# Patient Record
Sex: Female | Born: 1950
Health system: Southern US, Community
[De-identification: ages and names within clinical notes are randomized; demographics above are authoritative.]

## PROBLEM LIST (undated history)

## (undated) DIAGNOSIS — I313 Pericardial effusion (noninflammatory): Secondary | ICD-10-CM

## (undated) DIAGNOSIS — Z87891 Personal history of nicotine dependence: Secondary | ICD-10-CM

## (undated) DIAGNOSIS — R269 Unspecified abnormalities of gait and mobility: Secondary | ICD-10-CM

## (undated) DIAGNOSIS — I3139 Other pericardial effusion (noninflammatory): Secondary | ICD-10-CM

## (undated) DIAGNOSIS — I319 Disease of pericardium, unspecified: Secondary | ICD-10-CM

## (undated) DIAGNOSIS — IMO0002 Reserved for concepts with insufficient information to code with codable children: Secondary | ICD-10-CM

## (undated) DIAGNOSIS — Z794 Long term (current) use of insulin: Secondary | ICD-10-CM

## (undated) DIAGNOSIS — E785 Hyperlipidemia, unspecified: Secondary | ICD-10-CM

## (undated) DIAGNOSIS — J189 Pneumonia, unspecified organism: Secondary | ICD-10-CM

## (undated) DIAGNOSIS — G4733 Obstructive sleep apnea (adult) (pediatric): Secondary | ICD-10-CM

## (undated) DIAGNOSIS — R251 Tremor, unspecified: Secondary | ICD-10-CM

## (undated) DIAGNOSIS — J449 Chronic obstructive pulmonary disease, unspecified: Secondary | ICD-10-CM

## (undated) DIAGNOSIS — E119 Type 2 diabetes mellitus without complications: Secondary | ICD-10-CM

## (undated) DIAGNOSIS — IMO0001 Reserved for inherently not codable concepts without codable children: Secondary | ICD-10-CM

## (undated) DIAGNOSIS — R9430 Abnormal result of cardiovascular function study, unspecified: Secondary | ICD-10-CM

## (undated) DIAGNOSIS — K219 Gastro-esophageal reflux disease without esophagitis: Secondary | ICD-10-CM

## (undated) HISTORY — DX: Other pericardial effusion (noninflammatory): I31.39

## (undated) HISTORY — DX: Chronic obstructive pulmonary disease, unspecified: J44.9

## (undated) HISTORY — PX: OTHER SURGICAL HISTORY: SHX169

## (undated) HISTORY — DX: Hyperlipidemia, unspecified: E78.5

## (undated) HISTORY — DX: Disease of pericardium, unspecified: I31.9

## (undated) HISTORY — DX: Tremor, unspecified: R25.1

## (undated) HISTORY — DX: Pericardial effusion (noninflammatory): I31.3

## (undated) HISTORY — DX: Reserved for inherently not codable concepts without codable children: IMO0001

## (undated) HISTORY — DX: Personal history of nicotine dependence: Z87.891

## (undated) HISTORY — DX: Abnormal result of cardiovascular function study, unspecified: R94.30

## (undated) HISTORY — DX: Unspecified abnormalities of gait and mobility: R26.9

## (undated) HISTORY — DX: Gastro-esophageal reflux disease without esophagitis: K21.9

## (undated) HISTORY — DX: Obstructive sleep apnea (adult) (pediatric): G47.33

## (undated) HISTORY — DX: Long term (current) use of insulin: Z79.4

## (undated) HISTORY — DX: Morbid (severe) obesity due to excess calories: E66.01

## (undated) HISTORY — DX: Pneumonia, unspecified organism: J18.9

## (undated) HISTORY — DX: Reserved for concepts with insufficient information to code with codable children: IMO0002

## (undated) HISTORY — PX: ABDOMINAL HYSTERECTOMY: SHX81

## (undated) HISTORY — DX: Type 2 diabetes mellitus without complications: E11.9

## (undated) HISTORY — PX: NASAL SINUS SURGERY: SHX719

---

## 2008-06-18 DIAGNOSIS — J189 Pneumonia, unspecified organism: Secondary | ICD-10-CM

## 2008-06-18 HISTORY — DX: Pneumonia, unspecified organism: J18.9

## 2010-11-02 DIAGNOSIS — R0602 Shortness of breath: Secondary | ICD-10-CM

## 2010-11-15 DIAGNOSIS — R072 Precordial pain: Secondary | ICD-10-CM

## 2010-11-16 DIAGNOSIS — I319 Disease of pericardium, unspecified: Secondary | ICD-10-CM

## 2010-11-17 DIAGNOSIS — I300 Acute nonspecific idiopathic pericarditis: Secondary | ICD-10-CM

## 2010-11-20 DIAGNOSIS — I309 Acute pericarditis, unspecified: Secondary | ICD-10-CM

## 2010-11-21 ENCOUNTER — Encounter: Payer: Self-pay | Admitting: Physician Assistant

## 2010-11-27 ENCOUNTER — Telehealth: Payer: Self-pay | Admitting: *Deleted

## 2010-11-27 NOTE — Telephone Encounter (Signed)
Pt notified of results and verbalized understanding. Pt offered appt on Jun 27th but states she will be out of town that day. She is scheduled for 12/26/10.

## 2010-11-27 NOTE — Telephone Encounter (Signed)
Left message to call back on voicemail. (Pt will need to be set up with Dr. Myrtis Ser in the office.)

## 2010-11-27 NOTE — Telephone Encounter (Signed)
Message copied by Arlyss Gandy on Mon Nov 27, 2010  4:30 PM ------      Message from: Rande Brunt      Created: Fri Nov 24, 2010  8:20 AM       Reviewed hospital records. Recommend post hospital f/u with Myrtis Ser. If not available, then [place]

## 2010-12-13 ENCOUNTER — Encounter: Payer: Self-pay | Admitting: *Deleted

## 2010-12-25 ENCOUNTER — Encounter: Payer: Self-pay | Admitting: Cardiology

## 2010-12-25 DIAGNOSIS — I313 Pericardial effusion (noninflammatory): Secondary | ICD-10-CM | POA: Insufficient documentation

## 2010-12-25 DIAGNOSIS — J449 Chronic obstructive pulmonary disease, unspecified: Secondary | ICD-10-CM | POA: Insufficient documentation

## 2010-12-25 DIAGNOSIS — I319 Disease of pericardium, unspecified: Secondary | ICD-10-CM | POA: Insufficient documentation

## 2010-12-25 DIAGNOSIS — J45909 Unspecified asthma, uncomplicated: Secondary | ICD-10-CM | POA: Insufficient documentation

## 2010-12-25 DIAGNOSIS — R079 Chest pain, unspecified: Secondary | ICD-10-CM | POA: Insufficient documentation

## 2010-12-25 DIAGNOSIS — J4489 Other specified chronic obstructive pulmonary disease: Secondary | ICD-10-CM | POA: Insufficient documentation

## 2010-12-25 DIAGNOSIS — E1165 Type 2 diabetes mellitus with hyperglycemia: Secondary | ICD-10-CM | POA: Insufficient documentation

## 2010-12-25 DIAGNOSIS — K219 Gastro-esophageal reflux disease without esophagitis: Secondary | ICD-10-CM | POA: Insufficient documentation

## 2010-12-25 DIAGNOSIS — E785 Hyperlipidemia, unspecified: Secondary | ICD-10-CM | POA: Insufficient documentation

## 2010-12-25 DIAGNOSIS — R9430 Abnormal result of cardiovascular function study, unspecified: Secondary | ICD-10-CM | POA: Insufficient documentation

## 2010-12-25 DIAGNOSIS — Z87891 Personal history of nicotine dependence: Secondary | ICD-10-CM | POA: Insufficient documentation

## 2010-12-26 ENCOUNTER — Ambulatory Visit (INDEPENDENT_AMBULATORY_CARE_PROVIDER_SITE_OTHER): Payer: Medicare Other | Admitting: Cardiology

## 2010-12-26 ENCOUNTER — Encounter: Payer: Self-pay | Admitting: Cardiology

## 2010-12-26 DIAGNOSIS — I319 Disease of pericardium, unspecified: Secondary | ICD-10-CM

## 2010-12-26 DIAGNOSIS — K219 Gastro-esophageal reflux disease without esophagitis: Secondary | ICD-10-CM

## 2010-12-26 DIAGNOSIS — J449 Chronic obstructive pulmonary disease, unspecified: Secondary | ICD-10-CM

## 2010-12-26 NOTE — Assessment & Plan Note (Signed)
Patient has significant lung disease and is seeing pulmonology in her hometown.

## 2010-12-26 NOTE — Assessment & Plan Note (Signed)
Patient has recovered very nicely from her episode of pericarditis.  She does not need a followup echo.  I am hopeful that she will have no recurrence in the future.  Since she is still on steroids it is yet to be seen if she will have any recurrence off medications.  I am hopeful that she will not.

## 2010-12-26 NOTE — Assessment & Plan Note (Signed)
Her GERD symptoms are stable.  No further workup.

## 2010-12-26 NOTE — Progress Notes (Signed)
HPI Patient was seen for post hospital followup of pericarditis.  She had an episode and was hospitalized in late May, 2012.  She had a small pericardial effusion.  She has normal left ventricular function.  She has had a followup echo since her discharge.  This study shows normal left ventricular function.  There is the possibility of a slight persistent pericardial effusion.  There was no hemodynamic significance.  She has significant lung disease and had been on steroids in the past.  She is currently on tapering doses of steroids.  Hopefully when this is tapered completely she will not have recurrence of her pericarditis.  As part of my evaluation today I reviewed the hospital records including the discharge summary on our consultation.  I also reviewed the followup echo report carefully.  I discussed this with her. Allergies  Allergen Reactions  . Penicillins Itching  . Sulfa Antibiotics Itching    Current Outpatient Prescriptions  Medication Sig Dispense Refill  . aspirin 81 MG tablet Take 81 mg by mouth daily.        . Calcium Carbonate-Vit D-Min (CALCIUM 1200 PO) Take 1 tablet by mouth daily.        . Cholecalciferol (VITAMIN D3) 1000 UNITS CAPS Take 1 capsule by mouth daily.        . clonazePAM (KLONOPIN) 1 MG tablet Take 1 mg by mouth as needed.        . fish oil-omega-3 fatty acids 1000 MG capsule Take 2 g by mouth daily.        . fluticasone-salmeterol (ADVAIR HFA) 230-21 MCG/ACT inhaler Inhale 2 puffs into the lungs 2 (two) times daily.        . insulin aspart (NOVOLOG) 100 UNIT/ML injection Inject 6 Units into the skin 3 (three) times daily before meals.        . insulin glargine (LANTUS) 100 UNIT/ML injection Inject 35 Units into the skin at bedtime.       . metFORMIN (GLUCOPHAGE) 1000 MG tablet Take 1 tablet by mouth Twice daily.      . montelukast (SINGULAIR) 10 MG tablet Take 10 mg by mouth at bedtime.        . OXYGEN-HELIUM IN Inhale 2 L into the lungs at bedtime as needed.         . pantoprazole (PROTONIX) 40 MG tablet Take 1 tablet by mouth Daily.      . predniSONE (DELTASONE) 10 MG tablet Take 10 mg by mouth daily.       Marland Kitchen tiotropium (SPIRIVA) 18 MCG inhalation capsule Place 18 mcg into inhaler and inhale daily.        Marland Kitchen DISCONTD: Fluticasone-Salmeterol (ADVAIR DISKUS) 250-50 MCG/DOSE AEPB Inhale 1 puff into the lungs every 12 (twelve) hours.        Marland Kitchen DISCONTD: loratadine (CLARITIN) 10 MG tablet Take 10 mg by mouth daily.          History   Social History  . Marital Status: Divorced    Spouse Name: N/A    Number of Children: N/A  . Years of Education: N/A   Occupational History  . 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 Smoking status: Former Smoker -- 1.0 packs/day for 20 years    Types: Cigarettes    Quit date: 11/17/1995  . Smokeless tobacco: Never Used  . Alcohol Use: Yes     drinks on rare  occasions  . Drug Use: No  . Sexually Active: Not on file   Other Topics Concern  . Not on file   Social History Narrative   Lives in [place] alone.    Family History  Problem Relation Age of Onset  . Heart attack Mother     deceased at age 66  . Asthma Mother   . Other Father     deceased at age 73    Past Medical History  Diagnosis Date  . IDDM (insulin dependent diabetes mellitus)   . Dyslipidemia   . COPD (chronic obstructive pulmonary disease)     Chronic steroid use  . History of tobacco abuse   . Asthma   . Pneumonia 2010  . GERD (gastroesophageal reflux disease)   . Morbid obesity   . OSA (obstructive sleep apnea)     mild/not using C-PAP   . Chest pain     Hospital, May, 2012,  Pericarditis  . Pericardial effusion     Small, echo, circumferential, May, 2012  . Ejection fraction     Normal, echo, [id number]  . Pericarditis     Hospitalization, May, 2012    Past Surgical History  Procedure Date  . Removal of throat nodules     vocal cored nodules  .  Abdominal hysterectomy   . Nasal sinus surgery     ROS  Patient denies fever, chills, headache, sweats, rash, change in vision, change in hearing, chest pain, cough, nausea vomiting, urinary symptoms.  All the systems are reviewed and are negative.  PHYSICAL EXAM Patient is oriented to person time and place.  Affect is normal.  She is here with her young granddaughter today.  Lungs are clear.  Respiratory effort is unlabored.  Head is atraumatic there is no xanthelasma.  There is no jugular venous distention.  Lungs are clear.  Respiratory effort is not labored.  Cardiac exam reveals S1 and S2.  No clicks or significant murmurs.  The abdomen is soft.  There is no peripheral edema.  No musculoskeletal deformities.  There no skin rashes. Filed Vitals:   12/26/10 1011  BP: 117/81  Pulse: 107  Height: 5\' 4"  (1.626 m)  Weight: 186 lb (84.369 kg)  SpO2: 96%    EKG Is not done today.  ASSESSMENT & PLAN

## 2010-12-26 NOTE — Patient Instructions (Signed)
   Follow up as needed. Your physician recommends that you continue on your current medications as directed. Please refer to the Current Medication list given to you today. 

## 2015-06-23 LAB — HEMOGLOBIN A1C: HEMOGLOBIN A1C: 9.8

## 2015-08-01 ENCOUNTER — Encounter: Payer: Self-pay | Admitting: "Endocrinology

## 2015-08-01 ENCOUNTER — Ambulatory Visit (INDEPENDENT_AMBULATORY_CARE_PROVIDER_SITE_OTHER): Payer: Medicare HMO | Admitting: "Endocrinology

## 2015-08-01 VITALS — BP 127/83 | HR 95 | Ht 63.5 in | Wt 177.0 lb

## 2015-08-01 DIAGNOSIS — I10 Essential (primary) hypertension: Secondary | ICD-10-CM | POA: Diagnosis not present

## 2015-08-01 DIAGNOSIS — E1165 Type 2 diabetes mellitus with hyperglycemia: Secondary | ICD-10-CM | POA: Diagnosis not present

## 2015-08-01 DIAGNOSIS — E785 Hyperlipidemia, unspecified: Secondary | ICD-10-CM | POA: Diagnosis not present

## 2015-08-01 DIAGNOSIS — Z794 Long term (current) use of insulin: Secondary | ICD-10-CM

## 2015-08-01 DIAGNOSIS — E118 Type 2 diabetes mellitus with unspecified complications: Secondary | ICD-10-CM

## 2015-08-01 DIAGNOSIS — IMO0002 Reserved for concepts with insufficient information to code with codable children: Secondary | ICD-10-CM

## 2015-08-01 MED ORDER — INSULIN NPH ISOPHANE & REGULAR (70-30) 100 UNIT/ML ~~LOC~~ SUSP: 30.0000 [IU] | Freq: Two times a day (BID) | SUBCUTANEOUS | Status: DC

## 2015-08-01 NOTE — Patient Instructions (Signed)

## 2015-08-01 NOTE — Progress Notes (Signed)
Subjective:    Patient ID: Emily Humphrey, female    DOB: 03/01/1951. Patient is being seen in consultation for management of diabetes requested by  TAPPER,DAVID B, MD  Past Medical History  Diagnosis Date  . IDDM (insulin dependent diabetes mellitus) ([facility])   . Dyslipidemia   . COPD (chronic obstructive pulmonary disease) ([facility])     Chronic steroid use  . History of tobacco abuse   . Asthma   . Pneumonia 2010  . GERD (gastroesophageal reflux disease)   . Morbid obesity ([facility])   . OSA (obstructive sleep apnea)     mild/not using C-PAP   . Chest pain     Hospital, May, 2012,  Pericarditis  . Pericardial effusion     Small, echo, circumferential, May, 2012  . Ejection fraction     Normal, echo, [id number]  . Pericarditis     Hospitalization, May, 2012   Past Surgical History  Procedure Laterality Date  . Removal of throat nodules      vocal cored nodules  . Abdominal hysterectomy    . Nasal sinus surgery     Social History   Social History  . Marital Status: Divorced    Spouse Name: N/A  . Number of Children: N/A  . Years of Education: N/A   Occupational History  . 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 Smoking status: Former Smoker -- 1.00 packs/day for 20 years    Types: Cigarettes    Quit date: 11/17/1995  . Smokeless tobacco: Never Used  . Alcohol Use: Yes     Comment: drinks on rare occasions  . Drug Use: No  . Sexual Activity: Not Asked   Other Topics Concern  . None   Social History Narrative   Lives in [place] alone.   Outpatient Encounter Prescriptions as of 08/01/2015  Medication Sig  . Calcium Carbonate-Vit D-Min (CALCIUM 1200 PO) Take 1 tablet by mouth daily.    . clonazePAM (KLONOPIN) 1 MG tablet Take 1 mg by mouth as needed.    . fluticasone furoate-vilanterol (BREO ELLIPTA) 200-25 MCG/INH AEPB Inhale 1 puff into the lungs daily.  Marland Kitchen losartan (COZAAR) 50 MG tablet Take 50 mg  by mouth daily.  . metFORMIN (GLUCOPHAGE) 1000 MG tablet Take 1 tablet by mouth Twice daily.  . pantoprazole (PROTONIX) 40 MG tablet Take 1 tablet by mouth Daily.  . predniSONE (DELTASONE) 10 MG tablet Take 15 mg by mouth daily.  . sertraline (ZOLOFT) 100 MG tablet Take 100 mg by mouth daily.  Marland Kitchen umeclidinium bromide (INCRUSE ELLIPTA) 62.5 MCG/INH AEPB Inhale 1 puff into the lungs daily.  . [DISCONTINUED] insulin NPH Human (HUMULIN N,NOVOLIN N) 100 UNIT/ML injection Inject into the skin 2 (two) times daily before a meal. 35 units in the am & 15 units in the pm  . aspirin 81 MG tablet Take 81 mg by mouth daily.    . Cholecalciferol (VITAMIN D3) 1000 UNITS CAPS Take 1 capsule by mouth daily.    . fish oil-omega-3 fatty acids 1000 MG capsule Take 2 g by mouth daily.    . fluticasone-salmeterol (ADVAIR HFA) 230-21 MCG/ACT inhaler Inhale 2 puffs into the lungs 2 (two) times daily.    . insulin NPH-regular Human (NOVOLIN 70/30) (70-30) 100 UNIT/ML injection Inject 30 Units into the skin 2 (two) times daily with a meal.  . montelukast (SINGULAIR) 10 MG tablet Take  10 mg by mouth at bedtime.    . OXYGEN-HELIUM IN Inhale 2 L into the lungs at bedtime as needed.    . tiotropium (SPIRIVA) 18 MCG inhalation capsule Place 18 mcg into inhaler and inhale daily.    . [DISCONTINUED] insulin aspart (NOVOLOG) 100 UNIT/ML injection Inject 6 Units into the skin 3 (three) times daily before meals.    . [DISCONTINUED] insulin glargine (LANTUS) 100 UNIT/ML injection Inject 35 Units into the skin at bedtime.    No facility-administered encounter medications on file as of 08/01/2015.   ALLERGIES: Allergies  Allergen Reactions  . Penicillins Itching  . Sulfa Antibiotics Itching   VACCINATION STATUS:  There is no immunization history on file for this patient.  Diabetes She presents for her initial diabetic visit. She has type 2 diabetes mellitus. Onset time: She was diagnosed at approximate age of 65 years. Her  disease course has been worsening. There are no hypoglycemic associated symptoms. Pertinent negatives for hypoglycemia include no confusion, headaches, pallor or seizures. Associated symptoms include fatigue, polydipsia and polyuria. Pertinent negatives for diabetes include no chest pain and no polyphagia. There are no hypoglycemic complications. Symptoms are worsening. There are no diabetic complications. Risk factors for coronary artery disease include diabetes mellitus, obesity, hypertension, sedentary lifestyle and tobacco exposure. Current diabetic treatment includes insulin injections (She uses Novolin and 35 units a.m. and 15 units PM. She also uses metformin 1000 g by mouth twice a day.). Her weight is increasing steadily (She has taken prednisone for approximately 20 years as a result of her COPD, currently 15 mg daily). She is following a generally unhealthy diet. When asked about meal planning, she reported none. She has not had a previous visit with a dietitian. She rarely participates in exercise. Home blood sugar record trend: She did not bring the meter nor logs to review and admits to not monitoring regularly. An ACE inhibitor/angiotensin II receptor blocker is not being taken. Eye exam is current.  Hypertension This is a chronic problem. The current episode started more than 1 year ago. Pertinent negatives include no chest pain, headaches, palpitations or shortness of breath. Risk factors for coronary artery disease include diabetes mellitus, obesity, smoking/tobacco exposure and sedentary lifestyle. Past treatments include nothing.      Review of Systems  Constitutional: Positive for fatigue. Negative for fever, chills and unexpected weight change.  HENT: Negative for trouble swallowing and voice change.   Eyes: Negative for visual disturbance.  Respiratory: Negative for cough, shortness of breath and wheezing.   Cardiovascular: Negative for chest pain, palpitations and leg swelling.   Gastrointestinal: Negative for nausea, vomiting and diarrhea.  Endocrine: Positive for polydipsia and polyuria. Negative for cold intolerance, heat intolerance and polyphagia.  Musculoskeletal: Negative for myalgias and arthralgias.  Skin: Negative for color change, pallor, rash and wound.  Neurological: Negative for seizures and headaches.  Psychiatric/Behavioral: Negative for suicidal ideas and confusion.    Objective:    BP 127/83 mmHg  Pulse 95  Ht 5' 3.5" (1.613 m)  Wt 177 lb (80.287 kg)  BMI 30.86 kg/m2  SpO2 97%  Wt Readings from Last 3 Encounters:  08/01/15 177 lb (80.287 kg)  12/26/10 186 lb (84.369 kg)    Physical Exam  Constitutional: She is oriented to person, place, and time. She appears well-developed.  HENT:  Head: Normocephalic and atraumatic.  Eyes: EOM are normal.  Neck: Normal range of motion. Neck supple. No tracheal deviation present. No thyromegaly present.  Cardiovascular: Normal rate and  regular rhythm.   Pulmonary/Chest: Effort normal and breath sounds normal.  Abdominal: Soft. Bowel sounds are normal. There is no tenderness. There is no guarding.  Musculoskeletal: Normal range of motion. She exhibits no edema.  Neurological: She is alert and oriented to person, place, and time. She has normal reflexes. No cranial nerve deficit. Coordination normal.  Skin: Skin is warm and dry. No rash noted. No erythema. No pallor.  Psychiatric: She has a normal mood and affect. Judgment normal.    Her most recent A1c from 06/23/2015 was 9.8%. Her complete labs to be scanned into her records.   Assessment & Plan:   1. Uncontrolled type 2 diabetes mellitus with complication, with long-term current use of insulin ([place])  - Patient has currently uncontrolled symptomatic type 2 DM since  65 years of age,  with most recent A1c of 9.8 %. Recent labs reviewed.   Her diabetes is complicated by chronic steroid use due to COPD (currently concurrent use of prednisone 15 mg  by mouth daily) and patient remains at a high risk for more acute and chronic complications of diabetes which include CAD, CVA, CKD, retinopathy, and neuropathy. These are all discussed in detail with the patient.  - I have counseled the patient on diet management and weight loss, by adopting a carbohydrate restricted/protein rich diet.  - Suggestion is made for patient to avoid simple carbohydrates   from their diet including Cakes , Desserts, Ice Cream,  Soda (  diet and regular) , Sweet Tea , Candies,  Chips, Cookies, Artificial Sweeteners,   and "Sugar-free" Products . This will help patient to have stable blood glucose profile and potentially avoid unintended weight gain.  - I encouraged the patient to switch to  unprocessed or minimally processed complex starch and increased protein intake (animal or plant source), fruits, and vegetables.  - Patient is advised to stick to a routine mealtimes to eat 3 meals  a day and avoid unnecessary snacks ( to snack only to correct hypoglycemia).  - The patient will be scheduled with Jearld Fenton, RDN, CDE for individualized DM education.  - I have approached patient with the following individualized plan to manage diabetes and patient agrees:   - Given her high A1c, she would benefit from basal bolus insulin, however she insists that she cannot afford the co-pays for that he insulin analogs.  -Premixed insulin would still be at a better  Alternative to her current Novolin in. -I will prescribed Novolin 70/30/30 for her to use with breakfast and supper 30 units each time pre-meal blood glucose is above 90 mg/dL, associated with strict monitoring of glucose  AC and HS. -She will return in one week to review her logs and to make adjustments if necessary. - Patient is warned not to take insulin without proper monitoring per orders. -Adjustment parameters are given for hypo and hyperglycemia in writing. -Patient is encouraged to call clinic for blood  glucose levels less than 70 or above 300 mg /dl. - I will continue metformin 1000 mg by mouth twice a day, therapeutically suitable for patient. -Also note, given the fact that she took prednisone even at higher doses for the last 20 years puts her at risk of steroid induced adrenal insufficiency. I advised her not to stop steroids suddenly without tapering. -She will likely require at least replacement dose steroids equivalent of prednisone 10 mg on a daily basis.  - Patient will be considered for incretin therapy as appropriate next visit. - Patient  specific target  A1c;  LDL, HDL, Triglycerides, and  Waist Circumference were discussed in detail.  2) BP/HTN:  Controlled, currently not taking any medications. 3) Lipids/HPL:  Controlled with LDL of 88.  I will consider statins next visit.  4)  Weight/Diet: CDE Consult will be initiated , exercise, and detailed carbohydrates information provided.  5) Chronic Care/Health Maintenance:  -Patient is encouraged to continue to follow up with Ophthalmology, Podiatrist at least yearly or according to recommendations, and advised to   stay away from smoking. I have recommended yearly flu vaccine and pneumonia vaccination at least every 5 years; moderate intensity exercise for up to 150 minutes weekly; and  sleep for at least 7 hours a day.  - 60 minutes of time was spent on the care of this patient , 50% of which was applied for counseling on diabetes complications and their preventions.  - Patient to bring meter and  blood glucose logs during their next visit.   - I advised patient to maintain close follow up with TAPPER,DAVID B, MD for primary care needs.  Follow up plan: - Return in about 1 week (around 08/08/2015) for diabetes, high blood pressure, high cholesterol, follow up with meter and logs- no labs.  Glade Lloyd, MD Phone: [phone]  Fax: [phone]   08/01/2015, 4:05 PM

## 2015-08-09 ENCOUNTER — Ambulatory Visit (INDEPENDENT_AMBULATORY_CARE_PROVIDER_SITE_OTHER): Payer: Medicare HMO | Admitting: "Endocrinology

## 2015-08-09 ENCOUNTER — Encounter: Payer: Self-pay | Admitting: "Endocrinology

## 2015-08-09 VITALS — BP 106/78 | HR 93 | Ht 63.5 in | Wt 178.0 lb

## 2015-08-09 DIAGNOSIS — E1165 Type 2 diabetes mellitus with hyperglycemia: Secondary | ICD-10-CM | POA: Diagnosis not present

## 2015-08-09 DIAGNOSIS — I10 Essential (primary) hypertension: Secondary | ICD-10-CM

## 2015-08-09 DIAGNOSIS — E118 Type 2 diabetes mellitus with unspecified complications: Secondary | ICD-10-CM | POA: Diagnosis not present

## 2015-08-09 DIAGNOSIS — IMO0002 Reserved for concepts with insufficient information to code with codable children: Secondary | ICD-10-CM

## 2015-08-09 DIAGNOSIS — E785 Hyperlipidemia, unspecified: Secondary | ICD-10-CM | POA: Diagnosis not present

## 2015-08-09 DIAGNOSIS — Z794 Long term (current) use of insulin: Secondary | ICD-10-CM | POA: Diagnosis not present

## 2015-08-09 NOTE — Patient Instructions (Signed)

## 2015-08-09 NOTE — Progress Notes (Signed)
Subjective:    Patient ID: Emily Humphrey, female    DOB: Jun 17, 1951. Patient is being seen in consultation for management of diabetes requested by  TAPPER,DAVID B, MD  Past Medical History  Diagnosis Date  . IDDM (insulin dependent diabetes mellitus) ([place])   . Dyslipidemia   . COPD (chronic obstructive pulmonary disease) ([facility])     Chronic steroid use  . History of tobacco abuse   . Asthma   . Pneumonia 2010  . GERD (gastroesophageal reflux disease)   . Morbid obesity ([place])   . OSA (obstructive sleep apnea)     mild/not using C-PAP   . Chest pain     Hospital, May, 2012,  Pericarditis  . Pericardial effusion     Small, echo, circumferential, May, 2012  . Ejection fraction     Normal, echo, [id number]  . Pericarditis     Hospitalization, May, 2012   Past Surgical History  Procedure Laterality Date  . Removal of throat nodules      vocal cored nodules  . Abdominal hysterectomy    . Nasal sinus surgery     Social History   Social History  . Marital Status: Divorced    Spouse Name: N/A  . Number of Children: N/A  . Years of Education: N/A   Occupational History  . 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 Smoking status: Former Smoker -- 1.00 packs/day for 20 years    Types: Cigarettes    Quit date: 11/17/1995  . Smokeless tobacco: Never Used  . Alcohol Use: Yes     Comment: drinks on rare occasions  . Drug Use: No  . Sexual Activity: Not Asked   Other Topics Concern  . None   Social History Narrative   Lives in [place] alone.   Outpatient Encounter Prescriptions as of 08/09/2015  Medication Sig  . aspirin 81 MG tablet Take 81 mg by mouth daily.    . Calcium Carbonate-Vit D-Min (CALCIUM 1200 PO) Take 1 tablet by mouth daily.    . Cholecalciferol (VITAMIN D3) 1000 UNITS CAPS Take 1 capsule by mouth daily.    . clonazePAM (KLONOPIN) 1 MG tablet Take 1 mg by mouth as needed.    . fish  oil-omega-3 fatty acids 1000 MG capsule Take 2 g by mouth daily.    . fluticasone furoate-vilanterol (BREO ELLIPTA) 200-25 MCG/INH AEPB Inhale 1 puff into the lungs daily.  . fluticasone-salmeterol (ADVAIR HFA) 230-21 MCG/ACT inhaler Inhale 2 puffs into the lungs 2 (two) times daily.    . insulin NPH-regular Human (NOVOLIN 70/30) (70-30) 100 UNIT/ML injection Inject 30 Units into the skin 2 (two) times daily with a meal.  . losartan (COZAAR) 50 MG tablet Take 50 mg by mouth daily.  . metFORMIN (GLUCOPHAGE) 1000 MG tablet Take 1 tablet by mouth Twice daily.  . montelukast (SINGULAIR) 10 MG tablet Take 10 mg by mouth at bedtime.    . OXYGEN-HELIUM IN Inhale 2 L into the lungs at bedtime as needed.    . pantoprazole (PROTONIX) 40 MG tablet Take 1 tablet by mouth Daily.  . predniSONE (DELTASONE) 10 MG tablet Take 15 mg by mouth daily.  . sertraline (ZOLOFT) 100 MG tablet Take 100 mg by mouth daily.  Marland Kitchen tiotropium (SPIRIVA) 18 MCG inhalation capsule Place 18 mcg into inhaler and inhale daily.    Marland Kitchen umeclidinium bromide (INCRUSE ELLIPTA) 62.5 MCG/INH  AEPB Inhale 1 puff into the lungs daily.   No facility-administered encounter medications on file as of 08/09/2015.   ALLERGIES: Allergies  Allergen Reactions  . Penicillins Itching  . Sulfa Antibiotics Itching   VACCINATION STATUS:  There is no immunization history on file for this patient.  Diabetes She presents for her follow-up diabetic visit. She has type 2 diabetes mellitus. Onset time: She was diagnosed at approximate age of 82 years. Her disease course has been improving. There are no hypoglycemic associated symptoms. Pertinent negatives for hypoglycemia include no confusion, headaches, pallor or seizures. Associated symptoms include fatigue. Pertinent negatives for diabetes include no chest pain, no polydipsia, no polyphagia and no polyuria. There are no hypoglycemic complications. Symptoms are improving. There are no diabetic complications.  Risk factors for coronary artery disease include diabetes mellitus, obesity, hypertension, sedentary lifestyle and tobacco exposure. Current diabetic treatment includes insulin injections (She uses Novolin and 35 units a.m. and 15 units PM. She also uses metformin 1000 g by mouth twice a day.). Her weight is stable (She has taken prednisone for approximately 20 years as a result of her COPD, currently 10 mg daily). She is following a generally unhealthy diet. When asked about meal planning, she reported none. She has not had a previous visit with a dietitian. She rarely participates in exercise. Home blood sugar record trend: She came with improved blood glucose averaging 166. An ACE inhibitor/angiotensin II receptor blocker is not being taken. Eye exam is current.  Hypertension This is a chronic problem. The current episode started more than 1 year ago. Pertinent negatives include no chest pain, headaches, palpitations or shortness of breath. Risk factors for coronary artery disease include diabetes mellitus, obesity, smoking/tobacco exposure and sedentary lifestyle. Past treatments include nothing.      Review of Systems  Constitutional: Positive for fatigue. Negative for fever, chills and unexpected weight change.  HENT: Negative for trouble swallowing and voice change.   Eyes: Negative for visual disturbance.  Respiratory: Negative for cough, shortness of breath and wheezing.   Cardiovascular: Negative for chest pain, palpitations and leg swelling.  Gastrointestinal: Negative for nausea, vomiting and diarrhea.  Endocrine: Negative for cold intolerance, heat intolerance, polydipsia, polyphagia and polyuria.  Musculoskeletal: Negative for myalgias and arthralgias.  Skin: Negative for color change, pallor, rash and wound.  Neurological: Negative for seizures and headaches.  Psychiatric/Behavioral: Negative for suicidal ideas and confusion.    Objective:    BP 106/78 mmHg  Pulse 93  Ht 5'  3.5" (1.613 m)  Wt 178 lb (80.74 kg)  BMI 31.03 kg/m2  SpO2 100%  Wt Readings from Last 3 Encounters:  08/09/15 178 lb (80.74 kg)  08/01/15 177 lb (80.287 kg)  12/26/10 186 lb (84.369 kg)    Physical Exam  Constitutional: She is oriented to person, place, and time. She appears well-developed.  HENT:  Head: Normocephalic and atraumatic.  Eyes: EOM are normal.  Neck: Normal range of motion. Neck supple. No tracheal deviation present. No thyromegaly present.  Cardiovascular: Normal rate and regular rhythm.   Pulmonary/Chest: Effort normal and breath sounds normal.  Abdominal: Soft. Bowel sounds are normal. There is no tenderness. There is no guarding.  Musculoskeletal: Normal range of motion. She exhibits no edema.  Neurological: She is alert and oriented to person, place, and time. She has normal reflexes. No cranial nerve deficit. Coordination normal.  Skin: Skin is warm and dry. No rash noted. No erythema. No pallor.  Psychiatric: She has a normal mood and  affect. Judgment normal.    Her most recent A1c from 06/23/2015 was 9.8%. Her complete labs to be scanned into her records.   Assessment & Plan:   1. Uncontrolled type 2 diabetes mellitus with complication, with long-term current use of insulin ([place])  - Patient has currently uncontrolled symptomatic type 2 DM since  65 years of age,  with most recent A1c of 9.8 %. Recent labs reviewed.   Her diabetes is complicated by chronic steroid use due to COPD (currently concurrent use of prednisone 15 mg by mouth daily) and patient remains at a high risk for more acute and chronic complications of diabetes which include CAD, CVA, CKD, retinopathy, and neuropathy. These are all discussed in detail with the patient.  - I have counseled the patient on diet management and weight loss, by adopting a carbohydrate restricted/protein rich diet.  - Suggestion is made for patient to avoid simple carbohydrates   from their diet including Cakes ,  Desserts, Ice Cream,  Soda (  diet and regular) , Sweet Tea , Candies,  Chips, Cookies, Artificial Sweeteners,   and "Sugar-free" Products . This will help patient to have stable blood glucose profile and potentially avoid unintended weight gain.  - I encouraged the patient to switch to  unprocessed or minimally processed complex starch and increased protein intake (animal or plant source), fruits, and vegetables.  - Patient is advised to stick to a routine mealtimes to eat 3 meals  a day and avoid unnecessary snacks ( to snack only to correct hypoglycemia).  - The patient will be scheduled with Jearld Fenton, RDN, CDE for individualized DM education.  - I have approached patient with the following individualized plan to manage diabetes and patient agrees:   - Given her high A1c, she would benefit from basal bolus insulin, however she insists that she cannot afford the co-pays for the insulin analogs.  -Premixed insulin would still be at a better  Alternative to her current Novolin in. -I will  continue with Novolin 70/30 30  units with breakfast and 20 units with supper when  pre-meal blood glucose is above 90 mg/dL, associated with strict monitoring of glucose  AC and HS.  - Patient is warned not to take insulin without proper monitoring per orders. -Adjustment parameters are given for hypo and hyperglycemia in writing. -Patient is encouraged to call clinic for blood glucose levels less than 70 or above 300 mg /dl. - I will continue metformin 1000 mg by mouth twice a day, therapeutically suitable for patient. -Also note, given the fact that she took prednisone even at higher doses for the last 20 years puts her at risk of steroid induced adrenal insufficiency. I advised her not to stop steroids suddenly without tapering. -She will likely require at least replacement dose steroids equivalent of prednisone 10 mg on a daily basis.  - Patient will be considered for incretin therapy as appropriate  next visit. - Patient specific target  A1c;  LDL, HDL, Triglycerides, and  Waist Circumference were discussed in detail.  2) BP/HTN:  Controlled, currently not taking any medications. 3) Lipids/HPL:  Controlled with LDL of 88.  I will consider statins next visit.  4)  Weight/Diet: CDE Consult will be initiated , exercise, and detailed carbohydrates information provided.  5) Chronic Care/Health Maintenance:  -Patient is encouraged to continue to follow up with Ophthalmology, Podiatrist at least yearly or according to recommendations, and advised to   stay away from smoking. I have recommended yearly  flu vaccine and pneumonia vaccination at least every 5 years; moderate intensity exercise for up to 150 minutes weekly; and  sleep for at least 7 hours a day.  - 25 minutes of time was spent on the care of this patient , 50% of which was applied for counseling on diabetes complications and their preventions.  - Patient to bring meter and  blood glucose logs during their next visit.   - I advised patient to maintain close follow up with TAPPER,DAVID B, MD for primary care needs.  Follow up plan: - Return in about 8 weeks (around 10/04/2015) for diabetes, high blood pressure, high cholesterol, follow up with pre-visit labs, meter, and logs.  Glade Lloyd, MD Phone: [phone]  Fax: [phone]   08/09/2015, 10:19 AM

## 2015-08-26 ENCOUNTER — Encounter: Payer: Medicare HMO | Attending: "Endocrinology | Admitting: Nutrition

## 2015-08-26 ENCOUNTER — Encounter: Payer: Self-pay | Admitting: Nutrition

## 2015-08-26 VITALS — Ht 63.5 in | Wt 181.0 lb

## 2015-08-26 DIAGNOSIS — E118 Type 2 diabetes mellitus with unspecified complications: Secondary | ICD-10-CM | POA: Diagnosis present

## 2015-08-26 DIAGNOSIS — Z794 Long term (current) use of insulin: Secondary | ICD-10-CM | POA: Diagnosis present

## 2015-08-26 DIAGNOSIS — E669 Obesity, unspecified: Secondary | ICD-10-CM

## 2015-08-26 DIAGNOSIS — E1165 Type 2 diabetes mellitus with hyperglycemia: Secondary | ICD-10-CM

## 2015-08-26 NOTE — Progress Notes (Signed)
Diabetes Self-Management Education  Visit Type: First/Initial  Appt. Start Time: 0800 Appt. End Time: 900  08/26/2015  Emily Humphrey, identified by name and date of birth, is a 65 y.o. female with a diagnosis of Diabetes: Type 2.  Primary concerns today: Diabetes. Lives by herself. Most foods eaten at home. Most foods are grilled, baked and some fried.  She does her own cooking and shopping. 70.30 30 in am and 20 in pm and Metformin 100 mg BID. Tests blood sugars 2-3 times per day.PHyscal actvity: water aerobics twice a week and walks some. Desires to lose weight. Recently has changed cut out cokes and only drinks water and unsweet tea now.. She has chronic asthma and is on Prednisone for about 10 years or longer. Diet is excessive in CHO, and calories. ASSESSMENT  Height 5' 3.5" (1.613 m), weight 181 lb (82.101 kg). Body mass index is 31.56 kg/(m^2).      Diabetes Self-Management Education - 08/26/15 0909    Visit Information   Visit Type First/Initial   Initial Visit   Diabetes Type Type 2   Are you taking your medications as prescribed? Yes   Health Coping   How would you rate your overall health? Fair   Psychosocial Assessment   Patient Belief/Attitude about Diabetes Motivated to manage diabetes   Self-care barriers None   Self-management support Doctor's office;Family;Friends   Other persons present Patient   Patient Concerns Nutrition/Meal planning;Monitoring;Healthy Lifestyle;Glycemic Control   Special Needs None   Preferred Learning Style No preference indicated   Learning Readiness Not Ready   How often do you need to have someone help you when you read instructions, pamphlets, or other written materials from your doctor or pharmacy? 1 - Never   What is the last grade level you completed in school? 12   Complications   Last HgB A1C per patient/outside source 9.6 %   How often do you check your blood sugar? 1-2 times/day   Fasting Blood glucose range (mg/dL) 130-179    Postprandial Blood glucose range (mg/dL) 180-200   Number of hypoglycemic episodes per month 0   Number of hyperglycemic episodes per week 10   Can you tell when your blood sugar is high? No   Have you had a dilated eye exam in the past 12 months? Yes   Have you had a dental exam in the past 12 months? Yes   Are you checking your feet? Yes   Dietary Intake   Breakfast ! egg, 1 slice toast and 1 cereal bar   Lunch [place] sandwich with cheese and cereal bar and water   Dinner Meat and starch, water   Snack (evening) chips, cookies, miss.Marland Kitchen gets very hungry   Beverage(s) water   Exercise   Exercise Type Light (walking / raking leaves);Moderate (swimming / aerobic walking)   How many days per week to you exercise? 2   How many minutes per day do you exercise? 30   Total minutes per week of exercise 60   Patient Education   Previous Diabetes Education No   Disease state  Factors that contribute to the development of diabetes;Explored patient's options for treatment of their diabetes   Nutrition management  Role of diet in the treatment of diabetes and the relationship between the three main macronutrients and blood glucose level;Food label reading, portion sizes and measuring food.;Carbohydrate counting;Meal timing in regards to the patients' current diabetes medication.;Meal options for control of blood glucose level and chronic complications.  Physical activity and exercise  Role of exercise on diabetes management, blood pressure control and cardiac health.;Identified with patient nutritional and/or medication changes necessary with exercise.   Medications Taught/reviewed insulin injection, site rotation, insulin storage and needle disposal.;Reviewed patients medication for diabetes, action, purpose, timing of dose and side effects.   Monitoring Taught/evaluated SMBG meter.;Identified appropriate SMBG and/or A1C goals.   Acute complications Trained/discussed glucagon administration to  patient and designated other.;Discussed and identified patients' treatment of hyperglycemia.   Chronic complications Relationship between chronic complications and blood glucose control   Psychosocial adjustment Worked with patient to identify barriers to care and solutions;Identified and addressed patients feelings and concerns about diabetes   Personal strategies to promote health Lifestyle issues that need to be addressed for better diabetes care   Individualized Goals (developed by patient)   Nutrition Follow meal plan discussed;General guidelines for healthy choices and portions discussed   Physical Activity Exercise 3-5 times per week   Medications take my medication as prescribed   Monitoring  test my blood glucose as discussed;test blood glucose pre and post meals as discussed   Reducing Risk examine blood glucose patterns;do foot checks daily;increase portions of nuts and seeds   Health Coping --  meal planning   Outcomes   Expected Outcomes Demonstrated interest in learning. Expect positive outcomes   Future DMSE 4-6 wks   Program Status Completed      Individualized Plan for Diabetes Self-Management Training:   Learning Objective:  Patient will have a greater understanding of diabetes self-management. Patient education plan is to attend individual and/or group sessions per assessed needs and concerns.   Plan:    Goals: 1. Follow My Plate Method 2. Cut out snacks between and after meals. 3. Continue exercise 3-4 times per week. 4. Increase low carb vegetables to 3-4 servings per day. 5. Eat three pieces of fruits per day (1 per meal) 6. Lose 1 lb per week. 7. Get A1C down to 8% in 3 months. 8. Take meds as prescribed  9. Test blood sugars as directed.  Expected Outcomes:  Demonstrated interest in learning. Expect positive outcomes  Education material provided: Living Well with Diabetes, Meal plan card, My Plate and Carbohydrate counting sheet  If problems or  questions, patient to contact team via:  Phone  Future DSME appointment: 4-6 wks

## 2015-08-26 NOTE — Patient Instructions (Signed)
Goals: 1. Follow My Plate Method 2. Cut out snacks between and after meals. 3. Continue exercise 3-4 times per week. 4. Increase low carb vegetables to 3-4 servings per day. 5. Eat three pieces of fruits per day (1 per meal) 6. Lose 1 lb per week. 7. Get A1C down to 8% in 3 months. 8. Take meds as prescribed  9. Test blood sugars as directed.

## 2015-09-28 ENCOUNTER — Other Ambulatory Visit: Payer: Self-pay | Admitting: "Endocrinology

## 2015-09-28 LAB — BASIC METABOLIC PANEL
BUN: 14 mg/dL (ref 7–25)
CHLORIDE: 103 mmol/L (ref 98–110)
CO2: 23 mmol/L (ref 20–31)
CREATININE: 0.64 mg/dL (ref 0.50–0.99)
Calcium: 9 mg/dL (ref 8.6–10.4)
Glucose, Bld: 179 mg/dL — ABNORMAL HIGH (ref 65–99)
Potassium: 4.3 mmol/L (ref 3.5–5.3)
Sodium: 138 mmol/L (ref 135–146)

## 2015-09-28 LAB — HEMOGLOBIN A1C
HEMOGLOBIN A1C: 9.2 % — AB (ref <5.7)
MEAN PLASMA GLUCOSE: 217 mg/dL

## 2015-09-28 LAB — LIPID PANEL
CHOLESTEROL: 225 mg/dL — AB (ref 125–200)
HDL: 54 mg/dL (ref >=46)
LDL Cholesterol: 99 mg/dL (ref <130)
TRIGLYCERIDES: 359 mg/dL — AB (ref <150)
Total CHOL/HDL Ratio: 4.2 Ratio (ref <=5.0)
VLDL: 72 mg/dL — AB (ref <30)

## 2015-09-29 LAB — MICROALBUMIN / CREATININE URINE RATIO
CREATININE, URINE: 226 mg/dL (ref 20–320)
MICROALB UR: 1.1 mg/dL
MICROALB/CREAT RATIO: 5 ug/mg{creat} (ref <30)

## 2015-09-29 LAB — TSH: TSH: 1.03 mIU/L

## 2015-09-29 LAB — T4, FREE: Free T4: 1.2 ng/dL (ref 0.8–1.8)

## 2015-10-05 ENCOUNTER — Ambulatory Visit: Payer: Medicare HMO | Admitting: "Endocrinology

## 2015-10-05 ENCOUNTER — Ambulatory Visit: Payer: Medicare HMO | Admitting: Nutrition

## 2015-10-14 ENCOUNTER — Ambulatory Visit (INDEPENDENT_AMBULATORY_CARE_PROVIDER_SITE_OTHER): Payer: Medicare HMO | Admitting: "Endocrinology

## 2015-10-14 ENCOUNTER — Encounter: Payer: Self-pay | Admitting: "Endocrinology

## 2015-10-14 VITALS — BP 115/71 | HR 96 | Ht 63.5 in | Wt 180.0 lb

## 2015-10-14 DIAGNOSIS — I10 Essential (primary) hypertension: Secondary | ICD-10-CM

## 2015-10-14 DIAGNOSIS — E785 Hyperlipidemia, unspecified: Secondary | ICD-10-CM

## 2015-10-14 DIAGNOSIS — Z794 Long term (current) use of insulin: Secondary | ICD-10-CM

## 2015-10-14 DIAGNOSIS — E1165 Type 2 diabetes mellitus with hyperglycemia: Secondary | ICD-10-CM | POA: Diagnosis not present

## 2015-10-14 DIAGNOSIS — IMO0002 Reserved for concepts with insufficient information to code with codable children: Secondary | ICD-10-CM

## 2015-10-14 DIAGNOSIS — E118 Type 2 diabetes mellitus with unspecified complications: Secondary | ICD-10-CM | POA: Diagnosis not present

## 2015-10-14 NOTE — Progress Notes (Signed)
Subjective:    Patient ID: Emily Humphrey, female    DOB: [birth year]. Patient is being seen in f/u for management of diabetes.   Past Medical History  Diagnosis Date  . IDDM (insulin dependent diabetes mellitus) ([place])   . Dyslipidemia   . COPD (chronic obstructive pulmonary disease) ([facility])     Chronic steroid use  . History of tobacco abuse   . Asthma   . Pneumonia 2010  . GERD (gastroesophageal reflux disease)   . Morbid obesity ([place])   . OSA (obstructive sleep apnea)     mild/not using C-PAP   . Chest pain     Hospital, May, 2012,  Pericarditis  . Pericardial effusion     Small, echo, circumferential, May, 2012  . Ejection fraction     Normal, echo, [id number]  . Pericarditis     Hospitalization, May, 2012   Past Surgical History  Procedure Laterality Date  . Removal of throat nodules      vocal cored nodules  . Abdominal hysterectomy    . Nasal sinus surgery     Social History   Social History  . Marital Status: Divorced    Spouse Name: N/A  . Number of Children: N/A  . Years of Education: N/A   Occupational History  . 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 Smoking status: Former Smoker -- 1.00 packs/day for 20 years    Types: Cigarettes    Quit date: 11/17/1995  . Smokeless tobacco: Never Used  . Alcohol Use: Yes     Comment: drinks on rare occasions  . Drug Use: No  . Sexual Activity: Not Asked   Other Topics Concern  . None   Social History Narrative   Lives in [place] alone.   Outpatient Encounter Prescriptions as of 10/14/2015  Medication Sig  . aspirin 81 MG tablet Take 81 mg by mouth daily.    . Calcium Carbonate-Vit D-Min (CALCIUM 1200 PO) Take 1 tablet by mouth daily.    . Cholecalciferol (VITAMIN D3) 1000 UNITS CAPS Take 1 capsule by mouth daily.    . clonazePAM (KLONOPIN) 1 MG tablet Take 1 mg by mouth as needed.    . fish oil-omega-3 fatty acids 1000 MG capsule Take 2  g by mouth daily.    . fluticasone furoate-vilanterol (BREO ELLIPTA) 200-25 MCG/INH AEPB Inhale 1 puff into the lungs daily.  . fluticasone-salmeterol (ADVAIR HFA) 230-21 MCG/ACT inhaler Inhale 2 puffs into the lungs 2 (two) times daily.    . insulin NPH-regular Human (NOVOLIN 70/30) (70-30) 100 UNIT/ML injection Inject 30 Units into the skin 2 (two) times daily with a meal.  . losartan (COZAAR) 50 MG tablet Take 50 mg by mouth daily.  . metFORMIN (GLUCOPHAGE) 1000 MG tablet Take 1 tablet by mouth Twice daily.  . montelukast (SINGULAIR) 10 MG tablet Take 10 mg by mouth at bedtime.    . OXYGEN-HELIUM IN Inhale 2 L into the lungs at bedtime as needed.    . pantoprazole (PROTONIX) 40 MG tablet Take 1 tablet by mouth Daily.  . predniSONE (DELTASONE) 10 MG tablet Take 15 mg by mouth daily.  . sertraline (ZOLOFT) 100 MG tablet Take 100 mg by mouth daily.  Marland Kitchen tiotropium (SPIRIVA) 18 MCG inhalation capsule Place 18 mcg into inhaler and inhale daily.    Marland Kitchen umeclidinium bromide (INCRUSE ELLIPTA) 62.5 MCG/INH AEPB Inhale 1 puff into  the lungs daily.   No facility-administered encounter medications on file as of 10/14/2015.   ALLERGIES: Allergies  Allergen Reactions  . Penicillins Itching  . Sulfa Antibiotics Itching   VACCINATION STATUS:  There is no immunization history on file for this patient.  Diabetes She presents for her follow-up diabetic visit. She has type 2 diabetes mellitus. Onset time: She was diagnosed at approximate age of 35 years. Her disease course has been improving. There are no hypoglycemic associated symptoms. Pertinent negatives for hypoglycemia include no confusion, headaches, pallor or seizures. Associated symptoms include fatigue. Pertinent negatives for diabetes include no chest pain, no polydipsia, no polyphagia and no polyuria. There are no hypoglycemic complications. Symptoms are improving. There are no diabetic complications. Risk factors for coronary artery disease include  diabetes mellitus, obesity, hypertension, sedentary lifestyle and tobacco exposure. Current diabetic treatment includes insulin injections (She uses Novolin and 35 units a.m. and 15 units PM. She also uses metformin 1000 g by mouth twice a day.). Her weight is stable (She has taken prednisone for approximately 20 years as a result of her COPD, currently 10 mg daily.). She is following a generally unhealthy diet. When asked about meal planning, she reported none. She has not had a previous visit with a dietitian. She rarely participates in exercise. Home blood sugar record trend: She came with improved blood glucose averaging 166. Her breakfast blood glucose range is generally 180-200 mg/dl. Her dinner blood glucose range is generally >200 mg/dl. An ACE inhibitor/angiotensin II receptor blocker is not being taken. Eye exam is current.  Hypertension This is a chronic problem. The current episode started more than 1 year ago. Pertinent negatives include no chest pain, headaches, palpitations or shortness of breath. Risk factors for coronary artery disease include diabetes mellitus, obesity, smoking/tobacco exposure and sedentary lifestyle. Past treatments include nothing.      Review of Systems  Constitutional: Positive for fatigue. Negative for fever, chills and unexpected weight change.  HENT: Negative for trouble swallowing and voice change.   Eyes: Negative for visual disturbance.  Respiratory: Negative for cough, shortness of breath and wheezing.   Cardiovascular: Negative for chest pain, palpitations and leg swelling.  Gastrointestinal: Negative for nausea, vomiting and diarrhea.  Endocrine: Negative for cold intolerance, heat intolerance, polydipsia, polyphagia and polyuria.  Musculoskeletal: Negative for myalgias and arthralgias.  Skin: Negative for color change, pallor, rash and wound.  Neurological: Negative for seizures and headaches.  Psychiatric/Behavioral: Negative for suicidal ideas and  confusion.    Objective:    BP 115/71 mmHg  Pulse 96  Ht 5' 3.5" (1.613 m)  Wt 180 lb (81.647 kg)  BMI 31.38 kg/m2  SpO2 95%  Wt Readings from Last 3 Encounters:  10/14/15 180 lb (81.647 kg)  08/26/15 181 lb (82.101 kg)  08/09/15 178 lb (80.74 kg)    Physical Exam  Constitutional: She is oriented to person, place, and time. She appears well-developed.  HENT:  Head: Normocephalic and atraumatic.  Eyes: EOM are normal.  Neck: Normal range of motion. Neck supple. No tracheal deviation present. No thyromegaly present.  Cardiovascular: Normal rate and regular rhythm.   Pulmonary/Chest: Effort normal and breath sounds normal.  Abdominal: Soft. Bowel sounds are normal. There is no tenderness. There is no guarding.  Musculoskeletal: Normal range of motion. She exhibits no edema.  Neurological: She is alert and oriented to person, place, and time. She has normal reflexes. No cranial nerve deficit. Coordination normal.  Skin: Skin is warm and dry. No rash  noted. No erythema. No pallor.  Psychiatric: She has a normal mood and affect. Judgment normal.    Her most recent A1c  Has improved to 9.2 percent from from 06/23/2015 when it was 9.8%.  Assessment & Plan:   1. Uncontrolled type 2 diabetes mellitus with complication, with long-term current use of insulin ([place])  - Patient has currently uncontrolled symptomatic type 2 DM since  65 years of age,  with most recent A1c of 9.2 %. Recent labs reviewed.   Her diabetes is complicated by chronic steroid use due to COPD (currently concurrent use of prednisone 15 mg by mouth daily) and patient remains at a high risk for more acute and chronic complications of diabetes which include CAD, CVA, CKD, retinopathy, and neuropathy. These are all discussed in detail with the patient.  - I have counseled the patient on diet management and weight loss, by adopting a carbohydrate restricted/protein rich diet.  - Suggestion is made for patient to avoid  simple carbohydrates   from their diet including Cakes , Desserts, Ice Cream,  Soda (  diet and regular) , Sweet Tea , Candies,  Chips, Cookies, Artificial Sweeteners,   and "Sugar-free" Products . This will help patient to have stable blood glucose profile and potentially avoid unintended weight gain.  - I encouraged the patient to switch to  unprocessed or minimally processed complex starch and increased protein intake (animal or plant source), fruits, and vegetables.  - Patient is advised to stick to a routine mealtimes to eat 3 meals  a day and avoid unnecessary snacks ( to snack only to correct hypoglycemia).  - The patient will be scheduled with Jearld Fenton, RDN, CDE for individualized DM education.  - I have approached patient with the following individualized plan to manage diabetes and patient agrees:   - Given her high A1c, she would benefit from basal bolus insulin, however she insists that she cannot afford the co-pays for the insulin analogs.  -Premixed insulin would still be at a better  Alternative to her current Novolin in. -I will  continue with Novolin 70/30 35  units with breakfast and 20 units with supper when  pre-meal blood glucose is above 90 mg/dL, associated with strict monitoring of glucose  AC and HS.  - Patient is warned not to take insulin without proper monitoring per orders. -Adjustment parameters are given for hypo and hyperglycemia in writing. -Patient is encouraged to call clinic for blood glucose levels less than 70 or above 300 mg /dl. - I will continue metformin 1000 mg by mouth twice a day, therapeutically suitable for patient. -Also note, given the fact that she took prednisone even at higher doses for the last 20 years puts her at risk of steroid induced adrenal insufficiency. I advised her not to stop steroids suddenly without tapering. -She will likely require at least replacement dose steroids equivalent of prednisone 10 mg on a daily basis.  -  Patient will be considered for incretin therapy as appropriate next visit. - Patient specific target  A1c;  LDL, HDL, Triglycerides, and  Waist Circumference were discussed in detail.  2) BP/HTN:  Controlled, currently not taking any medications. 3) Lipids/HPL:  Controlled with LDL of 88.  I will consider statins next visit.  4)  Weight/Diet: CDE Consult will be initiated , exercise, and detailed carbohydrates information provided.  5) Chronic Care/Health Maintenance:  -Patient is encouraged to continue to follow up with Ophthalmology, Podiatrist at least yearly or according to recommendations, and  advised to   stay away from smoking. I have recommended yearly flu vaccine and pneumonia vaccination at least every 5 years; moderate intensity exercise for up to 150 minutes weekly; and  sleep for at least 7 hours a day.  - 25 minutes of time was spent on the care of this patient , 50% of which was applied for counseling on diabetes complications and their preventions.  - Patient to bring meter and  blood glucose logs during their next visit.   - I advised patient to maintain close follow up with TAPPER,DAVID B, MD for primary care needs.  Follow up plan: - Return in about 3 months (around 01/13/2016) for diabetes, high blood pressure, high cholesterol, follow up with pre-visit labs, meter, and logs.  Glade Lloyd, MD Phone: [phone]  Fax: [phone]   10/14/2015, 11:02 AM

## 2015-10-14 NOTE — Patient Instructions (Signed)

## 2016-01-16 ENCOUNTER — Ambulatory Visit: Payer: Medicare HMO | Admitting: "Endocrinology

## 2016-03-21 ENCOUNTER — Other Ambulatory Visit: Payer: Self-pay

## 2016-03-21 MED ORDER — BLOOD GLUCOSE MONITOR KIT: PACK | 0 refills | Status: DC

## 2016-03-22 ENCOUNTER — Telehealth: Payer: Self-pay | Admitting: "Endocrinology

## 2016-03-22 NOTE — Telephone Encounter (Signed)
error 

## 2016-05-28 ENCOUNTER — Other Ambulatory Visit: Payer: Self-pay | Admitting: "Endocrinology

## 2016-06-21 ENCOUNTER — Ambulatory Visit: Payer: Medicare HMO | Admitting: "Endocrinology

## 2017-02-04 ENCOUNTER — Other Ambulatory Visit (HOSPITAL_COMMUNITY): Payer: Self-pay | Admitting: Pulmonary Disease

## 2017-02-04 ENCOUNTER — Ambulatory Visit (HOSPITAL_COMMUNITY)
Admission: RE | Admit: 2017-02-04 | Discharge: 2017-02-04 | Disposition: A | Discharge status: Home / Self Care | Payer: Medicare HMO | Source: Ambulatory Visit | Attending: Pulmonary Disease | Admitting: Pulmonary Disease

## 2017-02-04 DIAGNOSIS — J9811 Atelectasis: Secondary | ICD-10-CM | POA: Diagnosis not present

## 2017-02-04 DIAGNOSIS — R05 Cough: Secondary | ICD-10-CM | POA: Diagnosis present

## 2017-02-04 DIAGNOSIS — R059 Cough, unspecified: Secondary | ICD-10-CM

## 2018-03-26 DIAGNOSIS — Z23 Encounter for immunization: Secondary | ICD-10-CM | POA: Diagnosis not present

## 2018-03-26 DIAGNOSIS — Z794 Long term (current) use of insulin: Secondary | ICD-10-CM | POA: Diagnosis not present

## 2018-03-26 DIAGNOSIS — I10 Essential (primary) hypertension: Secondary | ICD-10-CM | POA: Diagnosis not present

## 2018-03-26 DIAGNOSIS — E1165 Type 2 diabetes mellitus with hyperglycemia: Secondary | ICD-10-CM | POA: Diagnosis not present

## 2018-03-26 DIAGNOSIS — Z6830 Body mass index (BMI) 30.0-30.9, adult: Secondary | ICD-10-CM | POA: Diagnosis not present

## 2018-03-26 DIAGNOSIS — F411 Generalized anxiety disorder: Secondary | ICD-10-CM | POA: Diagnosis not present

## 2018-03-26 DIAGNOSIS — J45909 Unspecified asthma, uncomplicated: Secondary | ICD-10-CM | POA: Diagnosis not present

## 2018-03-27 DIAGNOSIS — Z794 Long term (current) use of insulin: Secondary | ICD-10-CM | POA: Diagnosis not present

## 2018-03-27 DIAGNOSIS — Z7984 Long term (current) use of oral hypoglycemic drugs: Secondary | ICD-10-CM | POA: Diagnosis not present

## 2018-03-27 DIAGNOSIS — E1165 Type 2 diabetes mellitus with hyperglycemia: Secondary | ICD-10-CM | POA: Diagnosis not present

## 2018-03-27 DIAGNOSIS — E113293 Type 2 diabetes mellitus with mild nonproliferative diabetic retinopathy without macular edema, bilateral: Secondary | ICD-10-CM | POA: Diagnosis not present

## 2018-04-29 DIAGNOSIS — N751 Abscess of Bartholin's gland: Secondary | ICD-10-CM | POA: Diagnosis not present

## 2018-04-29 DIAGNOSIS — Z6829 Body mass index (BMI) 29.0-29.9, adult: Secondary | ICD-10-CM | POA: Diagnosis not present

## 2018-04-30 DIAGNOSIS — Z7952 Long term (current) use of systemic steroids: Secondary | ICD-10-CM | POA: Diagnosis not present

## 2018-04-30 DIAGNOSIS — J45909 Unspecified asthma, uncomplicated: Secondary | ICD-10-CM | POA: Diagnosis not present

## 2018-04-30 DIAGNOSIS — J449 Chronic obstructive pulmonary disease, unspecified: Secondary | ICD-10-CM | POA: Diagnosis not present

## 2018-04-30 DIAGNOSIS — I959 Hypotension, unspecified: Secondary | ICD-10-CM | POA: Diagnosis not present

## 2018-04-30 DIAGNOSIS — Z87891 Personal history of nicotine dependence: Secondary | ICD-10-CM | POA: Diagnosis not present

## 2018-04-30 DIAGNOSIS — Z90710 Acquired absence of both cervix and uterus: Secondary | ICD-10-CM | POA: Diagnosis not present

## 2018-04-30 DIAGNOSIS — K5730 Diverticulosis of large intestine without perforation or abscess without bleeding: Secondary | ICD-10-CM | POA: Diagnosis not present

## 2018-04-30 DIAGNOSIS — N762 Acute vulvitis: Secondary | ICD-10-CM | POA: Diagnosis not present

## 2018-04-30 DIAGNOSIS — N764 Abscess of vulva: Secondary | ICD-10-CM | POA: Diagnosis not present

## 2018-04-30 DIAGNOSIS — I10 Essential (primary) hypertension: Secondary | ICD-10-CM | POA: Diagnosis not present

## 2018-04-30 DIAGNOSIS — B9562 Methicillin resistant Staphylococcus aureus infection as the cause of diseases classified elsewhere: Secondary | ICD-10-CM | POA: Diagnosis not present

## 2018-04-30 DIAGNOSIS — E119 Type 2 diabetes mellitus without complications: Secondary | ICD-10-CM | POA: Diagnosis not present

## 2018-04-30 DIAGNOSIS — Z794 Long term (current) use of insulin: Secondary | ICD-10-CM | POA: Diagnosis not present

## 2018-04-30 DIAGNOSIS — E1165 Type 2 diabetes mellitus with hyperglycemia: Secondary | ICD-10-CM | POA: Diagnosis not present

## 2018-05-19 DIAGNOSIS — Z09 Encounter for follow-up examination after completed treatment for conditions other than malignant neoplasm: Secondary | ICD-10-CM | POA: Diagnosis not present

## 2018-07-09 DIAGNOSIS — E1165 Type 2 diabetes mellitus with hyperglycemia: Secondary | ICD-10-CM | POA: Diagnosis not present

## 2018-07-09 DIAGNOSIS — Z6829 Body mass index (BMI) 29.0-29.9, adult: Secondary | ICD-10-CM | POA: Diagnosis not present

## 2018-08-26 DIAGNOSIS — N6320 Unspecified lump in the left breast, unspecified quadrant: Secondary | ICD-10-CM | POA: Diagnosis not present

## 2018-08-27 DIAGNOSIS — R928 Other abnormal and inconclusive findings on diagnostic imaging of breast: Secondary | ICD-10-CM | POA: Diagnosis not present

## 2018-08-27 DIAGNOSIS — N6322 Unspecified lump in the left breast, upper inner quadrant: Secondary | ICD-10-CM | POA: Diagnosis not present

## 2018-09-03 DIAGNOSIS — N6322 Unspecified lump in the left breast, upper inner quadrant: Secondary | ICD-10-CM | POA: Diagnosis not present

## 2018-09-03 DIAGNOSIS — N649 Disorder of breast, unspecified: Secondary | ICD-10-CM | POA: Diagnosis not present

## 2018-09-24 DIAGNOSIS — N6320 Unspecified lump in the left breast, unspecified quadrant: Secondary | ICD-10-CM | POA: Diagnosis not present

## 2018-09-24 DIAGNOSIS — D242 Benign neoplasm of left breast: Secondary | ICD-10-CM | POA: Insufficient documentation

## 2018-10-02 DIAGNOSIS — E1165 Type 2 diabetes mellitus with hyperglycemia: Secondary | ICD-10-CM | POA: Diagnosis not present

## 2018-10-02 DIAGNOSIS — I10 Essential (primary) hypertension: Secondary | ICD-10-CM | POA: Diagnosis not present

## 2018-10-07 DIAGNOSIS — B351 Tinea unguium: Secondary | ICD-10-CM | POA: Diagnosis not present

## 2018-10-07 DIAGNOSIS — Z6830 Body mass index (BMI) 30.0-30.9, adult: Secondary | ICD-10-CM | POA: Diagnosis not present

## 2018-10-07 DIAGNOSIS — Z1331 Encounter for screening for depression: Secondary | ICD-10-CM | POA: Diagnosis not present

## 2018-10-07 DIAGNOSIS — E1165 Type 2 diabetes mellitus with hyperglycemia: Secondary | ICD-10-CM | POA: Diagnosis not present

## 2018-10-07 DIAGNOSIS — I10 Essential (primary) hypertension: Secondary | ICD-10-CM | POA: Diagnosis not present

## 2018-10-07 DIAGNOSIS — F329 Major depressive disorder, single episode, unspecified: Secondary | ICD-10-CM | POA: Diagnosis not present

## 2018-10-07 DIAGNOSIS — J45909 Unspecified asthma, uncomplicated: Secondary | ICD-10-CM | POA: Diagnosis not present

## 2018-10-07 DIAGNOSIS — Z1389 Encounter for screening for other disorder: Secondary | ICD-10-CM | POA: Diagnosis not present

## 2018-10-27 DIAGNOSIS — E119 Type 2 diabetes mellitus without complications: Secondary | ICD-10-CM | POA: Diagnosis not present

## 2018-10-27 DIAGNOSIS — E7800 Pure hypercholesterolemia, unspecified: Secondary | ICD-10-CM | POA: Diagnosis not present

## 2018-10-27 DIAGNOSIS — F329 Major depressive disorder, single episode, unspecified: Secondary | ICD-10-CM | POA: Diagnosis not present

## 2018-10-27 DIAGNOSIS — M5412 Radiculopathy, cervical region: Secondary | ICD-10-CM | POA: Diagnosis not present

## 2018-10-27 DIAGNOSIS — D242 Benign neoplasm of left breast: Secondary | ICD-10-CM | POA: Diagnosis not present

## 2018-10-27 DIAGNOSIS — J449 Chronic obstructive pulmonary disease, unspecified: Secondary | ICD-10-CM | POA: Diagnosis not present

## 2018-10-27 DIAGNOSIS — M1990 Unspecified osteoarthritis, unspecified site: Secondary | ICD-10-CM | POA: Diagnosis not present

## 2018-10-27 DIAGNOSIS — I10 Essential (primary) hypertension: Secondary | ICD-10-CM | POA: Diagnosis not present

## 2018-10-27 DIAGNOSIS — K219 Gastro-esophageal reflux disease without esophagitis: Secondary | ICD-10-CM | POA: Diagnosis not present

## 2018-10-28 DIAGNOSIS — N6322 Unspecified lump in the left breast, upper inner quadrant: Secondary | ICD-10-CM | POA: Diagnosis not present

## 2018-10-29 DIAGNOSIS — K219 Gastro-esophageal reflux disease without esophagitis: Secondary | ICD-10-CM | POA: Diagnosis not present

## 2018-10-29 DIAGNOSIS — E7800 Pure hypercholesterolemia, unspecified: Secondary | ICD-10-CM | POA: Diagnosis not present

## 2018-10-29 DIAGNOSIS — M1990 Unspecified osteoarthritis, unspecified site: Secondary | ICD-10-CM | POA: Diagnosis not present

## 2018-10-29 DIAGNOSIS — M5412 Radiculopathy, cervical region: Secondary | ICD-10-CM | POA: Diagnosis not present

## 2018-10-29 DIAGNOSIS — D493 Neoplasm of unspecified behavior of breast: Secondary | ICD-10-CM | POA: Diagnosis not present

## 2018-10-29 DIAGNOSIS — J449 Chronic obstructive pulmonary disease, unspecified: Secondary | ICD-10-CM | POA: Diagnosis not present

## 2018-10-29 DIAGNOSIS — E119 Type 2 diabetes mellitus without complications: Secondary | ICD-10-CM | POA: Diagnosis not present

## 2018-10-29 DIAGNOSIS — D4862 Neoplasm of uncertain behavior of left breast: Secondary | ICD-10-CM | POA: Diagnosis not present

## 2018-10-29 DIAGNOSIS — F329 Major depressive disorder, single episode, unspecified: Secondary | ICD-10-CM | POA: Diagnosis not present

## 2018-10-29 DIAGNOSIS — I10 Essential (primary) hypertension: Secondary | ICD-10-CM | POA: Diagnosis not present

## 2018-10-29 DIAGNOSIS — D242 Benign neoplasm of left breast: Secondary | ICD-10-CM | POA: Diagnosis not present

## 2018-10-29 DIAGNOSIS — N6322 Unspecified lump in the left breast, upper inner quadrant: Secondary | ICD-10-CM | POA: Diagnosis not present

## 2018-11-17 DIAGNOSIS — G4700 Insomnia, unspecified: Secondary | ICD-10-CM | POA: Diagnosis not present

## 2018-12-31 DIAGNOSIS — E1165 Type 2 diabetes mellitus with hyperglycemia: Secondary | ICD-10-CM | POA: Diagnosis not present

## 2018-12-31 DIAGNOSIS — E782 Mixed hyperlipidemia: Secondary | ICD-10-CM | POA: Diagnosis not present

## 2018-12-31 DIAGNOSIS — I10 Essential (primary) hypertension: Secondary | ICD-10-CM | POA: Diagnosis not present

## 2018-12-31 DIAGNOSIS — F411 Generalized anxiety disorder: Secondary | ICD-10-CM | POA: Diagnosis not present

## 2019-01-07 DIAGNOSIS — Z0001 Encounter for general adult medical examination with abnormal findings: Secondary | ICD-10-CM | POA: Diagnosis not present

## 2019-01-07 DIAGNOSIS — E782 Mixed hyperlipidemia: Secondary | ICD-10-CM | POA: Diagnosis not present

## 2019-01-07 DIAGNOSIS — E1165 Type 2 diabetes mellitus with hyperglycemia: Secondary | ICD-10-CM | POA: Diagnosis not present

## 2019-01-07 DIAGNOSIS — J45909 Unspecified asthma, uncomplicated: Secondary | ICD-10-CM | POA: Diagnosis not present

## 2019-01-07 DIAGNOSIS — I10 Essential (primary) hypertension: Secondary | ICD-10-CM | POA: Diagnosis not present

## 2019-01-07 DIAGNOSIS — B351 Tinea unguium: Secondary | ICD-10-CM | POA: Diagnosis not present

## 2019-01-07 DIAGNOSIS — Z6829 Body mass index (BMI) 29.0-29.9, adult: Secondary | ICD-10-CM | POA: Diagnosis not present

## 2019-01-07 DIAGNOSIS — F411 Generalized anxiety disorder: Secondary | ICD-10-CM | POA: Diagnosis not present

## 2019-01-15 DIAGNOSIS — Z7952 Long term (current) use of systemic steroids: Secondary | ICD-10-CM | POA: Diagnosis not present

## 2019-01-15 DIAGNOSIS — J4541 Moderate persistent asthma with (acute) exacerbation: Secondary | ICD-10-CM | POA: Diagnosis not present

## 2019-01-16 DIAGNOSIS — E782 Mixed hyperlipidemia: Secondary | ICD-10-CM | POA: Diagnosis not present

## 2019-01-16 DIAGNOSIS — I10 Essential (primary) hypertension: Secondary | ICD-10-CM | POA: Diagnosis not present

## 2019-01-19 DIAGNOSIS — H25813 Combined forms of age-related cataract, bilateral: Secondary | ICD-10-CM | POA: Diagnosis not present

## 2019-01-19 DIAGNOSIS — E119 Type 2 diabetes mellitus without complications: Secondary | ICD-10-CM | POA: Diagnosis not present

## 2019-01-19 DIAGNOSIS — H04123 Dry eye syndrome of bilateral lacrimal glands: Secondary | ICD-10-CM | POA: Diagnosis not present

## 2019-02-03 DIAGNOSIS — I10 Essential (primary) hypertension: Secondary | ICD-10-CM | POA: Diagnosis not present

## 2019-02-03 DIAGNOSIS — F329 Major depressive disorder, single episode, unspecified: Secondary | ICD-10-CM | POA: Diagnosis not present

## 2019-02-03 DIAGNOSIS — B351 Tinea unguium: Secondary | ICD-10-CM | POA: Diagnosis not present

## 2019-02-03 DIAGNOSIS — R109 Unspecified abdominal pain: Secondary | ICD-10-CM | POA: Diagnosis not present

## 2019-02-03 DIAGNOSIS — G4700 Insomnia, unspecified: Secondary | ICD-10-CM | POA: Diagnosis not present

## 2019-02-03 DIAGNOSIS — E1165 Type 2 diabetes mellitus with hyperglycemia: Secondary | ICD-10-CM | POA: Diagnosis not present

## 2019-02-03 DIAGNOSIS — J45909 Unspecified asthma, uncomplicated: Secondary | ICD-10-CM | POA: Diagnosis not present

## 2019-02-03 DIAGNOSIS — Z6829 Body mass index (BMI) 29.0-29.9, adult: Secondary | ICD-10-CM | POA: Diagnosis not present

## 2019-02-16 DIAGNOSIS — I10 Essential (primary) hypertension: Secondary | ICD-10-CM | POA: Diagnosis not present

## 2019-02-16 DIAGNOSIS — E782 Mixed hyperlipidemia: Secondary | ICD-10-CM | POA: Diagnosis not present

## 2019-02-19 DIAGNOSIS — R911 Solitary pulmonary nodule: Secondary | ICD-10-CM | POA: Diagnosis not present

## 2019-02-19 DIAGNOSIS — M940 Chondrocostal junction syndrome [Tietze]: Secondary | ICD-10-CM | POA: Diagnosis not present

## 2019-02-19 DIAGNOSIS — Z6830 Body mass index (BMI) 30.0-30.9, adult: Secondary | ICD-10-CM | POA: Diagnosis not present

## 2019-02-19 DIAGNOSIS — R0781 Pleurodynia: Secondary | ICD-10-CM | POA: Diagnosis not present

## 2019-02-19 DIAGNOSIS — R05 Cough: Secondary | ICD-10-CM | POA: Diagnosis not present

## 2019-02-25 DIAGNOSIS — R9389 Abnormal findings on diagnostic imaging of other specified body structures: Secondary | ICD-10-CM | POA: Diagnosis not present

## 2019-02-25 DIAGNOSIS — R079 Chest pain, unspecified: Secondary | ICD-10-CM | POA: Diagnosis not present

## 2019-02-25 DIAGNOSIS — I700 Atherosclerosis of aorta: Secondary | ICD-10-CM | POA: Diagnosis not present

## 2019-02-25 DIAGNOSIS — R05 Cough: Secondary | ICD-10-CM | POA: Diagnosis not present

## 2019-02-25 DIAGNOSIS — J439 Emphysema, unspecified: Secondary | ICD-10-CM | POA: Diagnosis not present

## 2019-02-27 DIAGNOSIS — H25812 Combined forms of age-related cataract, left eye: Secondary | ICD-10-CM | POA: Diagnosis not present

## 2019-03-18 DIAGNOSIS — F331 Major depressive disorder, recurrent, moderate: Secondary | ICD-10-CM | POA: Diagnosis not present

## 2019-03-18 DIAGNOSIS — I10 Essential (primary) hypertension: Secondary | ICD-10-CM | POA: Diagnosis not present

## 2019-03-25 DIAGNOSIS — J4541 Moderate persistent asthma with (acute) exacerbation: Secondary | ICD-10-CM | POA: Diagnosis not present

## 2019-03-25 DIAGNOSIS — Z7952 Long term (current) use of systemic steroids: Secondary | ICD-10-CM | POA: Diagnosis not present

## 2019-04-06 DIAGNOSIS — Z6830 Body mass index (BMI) 30.0-30.9, adult: Secondary | ICD-10-CM | POA: Diagnosis not present

## 2019-04-06 DIAGNOSIS — J45909 Unspecified asthma, uncomplicated: Secondary | ICD-10-CM | POA: Diagnosis not present

## 2019-04-06 DIAGNOSIS — F329 Major depressive disorder, single episode, unspecified: Secondary | ICD-10-CM | POA: Diagnosis not present

## 2019-04-06 DIAGNOSIS — I10 Essential (primary) hypertension: Secondary | ICD-10-CM | POA: Diagnosis not present

## 2019-04-06 DIAGNOSIS — E1165 Type 2 diabetes mellitus with hyperglycemia: Secondary | ICD-10-CM | POA: Diagnosis not present

## 2019-04-06 DIAGNOSIS — Z23 Encounter for immunization: Secondary | ICD-10-CM | POA: Diagnosis not present

## 2019-04-06 DIAGNOSIS — B351 Tinea unguium: Secondary | ICD-10-CM | POA: Diagnosis not present

## 2019-04-06 DIAGNOSIS — R109 Unspecified abdominal pain: Secondary | ICD-10-CM | POA: Diagnosis not present

## 2019-04-07 DIAGNOSIS — H2511 Age-related nuclear cataract, right eye: Secondary | ICD-10-CM | POA: Diagnosis not present

## 2019-04-10 DIAGNOSIS — H25811 Combined forms of age-related cataract, right eye: Secondary | ICD-10-CM | POA: Diagnosis not present

## 2019-04-17 DIAGNOSIS — I10 Essential (primary) hypertension: Secondary | ICD-10-CM | POA: Diagnosis not present

## 2019-04-17 DIAGNOSIS — E1165 Type 2 diabetes mellitus with hyperglycemia: Secondary | ICD-10-CM | POA: Diagnosis not present

## 2019-05-05 DIAGNOSIS — E782 Mixed hyperlipidemia: Secondary | ICD-10-CM | POA: Diagnosis not present

## 2019-05-05 DIAGNOSIS — E1165 Type 2 diabetes mellitus with hyperglycemia: Secondary | ICD-10-CM | POA: Diagnosis not present

## 2019-05-05 DIAGNOSIS — I10 Essential (primary) hypertension: Secondary | ICD-10-CM | POA: Diagnosis not present

## 2019-05-07 DIAGNOSIS — I10 Essential (primary) hypertension: Secondary | ICD-10-CM | POA: Diagnosis not present

## 2019-05-07 DIAGNOSIS — J45909 Unspecified asthma, uncomplicated: Secondary | ICD-10-CM | POA: Diagnosis not present

## 2019-05-07 DIAGNOSIS — R109 Unspecified abdominal pain: Secondary | ICD-10-CM | POA: Diagnosis not present

## 2019-05-07 DIAGNOSIS — B351 Tinea unguium: Secondary | ICD-10-CM | POA: Diagnosis not present

## 2019-05-07 DIAGNOSIS — Z6830 Body mass index (BMI) 30.0-30.9, adult: Secondary | ICD-10-CM | POA: Diagnosis not present

## 2019-05-07 DIAGNOSIS — F329 Major depressive disorder, single episode, unspecified: Secondary | ICD-10-CM | POA: Diagnosis not present

## 2019-05-07 DIAGNOSIS — E1165 Type 2 diabetes mellitus with hyperglycemia: Secondary | ICD-10-CM | POA: Diagnosis not present

## 2019-05-07 DIAGNOSIS — F411 Generalized anxiety disorder: Secondary | ICD-10-CM | POA: Diagnosis not present

## 2019-05-18 DIAGNOSIS — F329 Major depressive disorder, single episode, unspecified: Secondary | ICD-10-CM | POA: Diagnosis not present

## 2019-05-18 DIAGNOSIS — I10 Essential (primary) hypertension: Secondary | ICD-10-CM | POA: Diagnosis not present

## 2019-06-25 DIAGNOSIS — R519 Headache, unspecified: Secondary | ICD-10-CM | POA: Diagnosis not present

## 2019-06-25 DIAGNOSIS — J449 Chronic obstructive pulmonary disease, unspecified: Secondary | ICD-10-CM | POA: Diagnosis not present

## 2019-06-25 DIAGNOSIS — I959 Hypotension, unspecified: Secondary | ICD-10-CM | POA: Diagnosis not present

## 2019-06-25 DIAGNOSIS — Z87891 Personal history of nicotine dependence: Secondary | ICD-10-CM | POA: Diagnosis not present

## 2019-06-25 DIAGNOSIS — I10 Essential (primary) hypertension: Secondary | ICD-10-CM | POA: Diagnosis not present

## 2019-06-25 DIAGNOSIS — R55 Syncope and collapse: Secondary | ICD-10-CM | POA: Diagnosis not present

## 2019-06-25 DIAGNOSIS — R42 Dizziness and giddiness: Secondary | ICD-10-CM | POA: Diagnosis not present

## 2019-06-25 DIAGNOSIS — R52 Pain, unspecified: Secondary | ICD-10-CM | POA: Diagnosis not present

## 2019-06-25 DIAGNOSIS — R0902 Hypoxemia: Secondary | ICD-10-CM | POA: Diagnosis not present

## 2019-06-25 DIAGNOSIS — E860 Dehydration: Secondary | ICD-10-CM | POA: Diagnosis not present

## 2019-06-25 DIAGNOSIS — F329 Major depressive disorder, single episode, unspecified: Secondary | ICD-10-CM | POA: Diagnosis not present

## 2019-06-25 DIAGNOSIS — K219 Gastro-esophageal reflux disease without esophagitis: Secondary | ICD-10-CM | POA: Diagnosis not present

## 2019-06-29 DIAGNOSIS — R55 Syncope and collapse: Secondary | ICD-10-CM | POA: Diagnosis not present

## 2019-06-29 DIAGNOSIS — I959 Hypotension, unspecified: Secondary | ICD-10-CM | POA: Diagnosis not present

## 2019-07-09 DIAGNOSIS — J0101 Acute recurrent maxillary sinusitis: Secondary | ICD-10-CM | POA: Diagnosis not present

## 2019-07-16 DIAGNOSIS — R55 Syncope and collapse: Secondary | ICD-10-CM | POA: Diagnosis not present

## 2019-07-16 DIAGNOSIS — Z6830 Body mass index (BMI) 30.0-30.9, adult: Secondary | ICD-10-CM | POA: Diagnosis not present

## 2019-07-16 DIAGNOSIS — I959 Hypotension, unspecified: Secondary | ICD-10-CM | POA: Diagnosis not present

## 2019-07-17 DIAGNOSIS — R55 Syncope and collapse: Secondary | ICD-10-CM | POA: Diagnosis not present

## 2019-07-17 DIAGNOSIS — I959 Hypotension, unspecified: Secondary | ICD-10-CM | POA: Diagnosis not present

## 2019-07-31 ENCOUNTER — Encounter: Payer: Self-pay | Admitting: Endocrinology

## 2019-08-12 DIAGNOSIS — J4541 Moderate persistent asthma with (acute) exacerbation: Secondary | ICD-10-CM | POA: Diagnosis not present

## 2019-08-14 DIAGNOSIS — E7849 Other hyperlipidemia: Secondary | ICD-10-CM | POA: Diagnosis not present

## 2019-08-14 DIAGNOSIS — I10 Essential (primary) hypertension: Secondary | ICD-10-CM | POA: Diagnosis not present

## 2019-08-14 DIAGNOSIS — E1165 Type 2 diabetes mellitus with hyperglycemia: Secondary | ICD-10-CM | POA: Diagnosis not present

## 2019-08-20 DIAGNOSIS — I10 Essential (primary) hypertension: Secondary | ICD-10-CM | POA: Diagnosis not present

## 2019-08-20 DIAGNOSIS — E7800 Pure hypercholesterolemia, unspecified: Secondary | ICD-10-CM | POA: Diagnosis not present

## 2019-08-20 DIAGNOSIS — J45909 Unspecified asthma, uncomplicated: Secondary | ICD-10-CM | POA: Diagnosis not present

## 2019-08-20 DIAGNOSIS — E1165 Type 2 diabetes mellitus with hyperglycemia: Secondary | ICD-10-CM | POA: Diagnosis not present

## 2019-08-20 DIAGNOSIS — Z7952 Long term (current) use of systemic steroids: Secondary | ICD-10-CM | POA: Diagnosis not present

## 2019-08-20 DIAGNOSIS — R809 Proteinuria, unspecified: Secondary | ICD-10-CM | POA: Diagnosis not present

## 2019-08-27 DIAGNOSIS — E1165 Type 2 diabetes mellitus with hyperglycemia: Secondary | ICD-10-CM | POA: Diagnosis not present

## 2019-08-27 DIAGNOSIS — I10 Essential (primary) hypertension: Secondary | ICD-10-CM | POA: Diagnosis not present

## 2019-08-27 DIAGNOSIS — R946 Abnormal results of thyroid function studies: Secondary | ICD-10-CM | POA: Diagnosis not present

## 2019-08-27 DIAGNOSIS — E782 Mixed hyperlipidemia: Secondary | ICD-10-CM | POA: Diagnosis not present

## 2019-09-01 DIAGNOSIS — Z6829 Body mass index (BMI) 29.0-29.9, adult: Secondary | ICD-10-CM | POA: Diagnosis not present

## 2019-09-01 DIAGNOSIS — I10 Essential (primary) hypertension: Secondary | ICD-10-CM | POA: Diagnosis not present

## 2019-09-01 DIAGNOSIS — J45909 Unspecified asthma, uncomplicated: Secondary | ICD-10-CM | POA: Diagnosis not present

## 2019-09-01 DIAGNOSIS — R109 Unspecified abdominal pain: Secondary | ICD-10-CM | POA: Diagnosis not present

## 2019-09-01 DIAGNOSIS — F411 Generalized anxiety disorder: Secondary | ICD-10-CM | POA: Diagnosis not present

## 2019-09-01 DIAGNOSIS — B351 Tinea unguium: Secondary | ICD-10-CM | POA: Diagnosis not present

## 2019-09-01 DIAGNOSIS — E782 Mixed hyperlipidemia: Secondary | ICD-10-CM | POA: Diagnosis not present

## 2019-09-01 DIAGNOSIS — E1165 Type 2 diabetes mellitus with hyperglycemia: Secondary | ICD-10-CM | POA: Diagnosis not present

## 2019-09-15 DIAGNOSIS — I10 Essential (primary) hypertension: Secondary | ICD-10-CM | POA: Diagnosis not present

## 2019-09-15 DIAGNOSIS — J449 Chronic obstructive pulmonary disease, unspecified: Secondary | ICD-10-CM | POA: Diagnosis not present

## 2019-09-16 DIAGNOSIS — J449 Chronic obstructive pulmonary disease, unspecified: Secondary | ICD-10-CM | POA: Diagnosis not present

## 2019-09-16 DIAGNOSIS — I10 Essential (primary) hypertension: Secondary | ICD-10-CM | POA: Diagnosis not present

## 2019-09-28 DIAGNOSIS — J309 Allergic rhinitis, unspecified: Secondary | ICD-10-CM | POA: Diagnosis not present

## 2019-09-28 DIAGNOSIS — R5383 Other fatigue: Secondary | ICD-10-CM | POA: Diagnosis not present

## 2019-10-14 DIAGNOSIS — D7289 Other specified disorders of white blood cells: Secondary | ICD-10-CM | POA: Diagnosis not present

## 2019-10-15 DIAGNOSIS — R5383 Other fatigue: Secondary | ICD-10-CM | POA: Diagnosis not present

## 2019-10-15 DIAGNOSIS — J309 Allergic rhinitis, unspecified: Secondary | ICD-10-CM | POA: Diagnosis not present

## 2019-10-16 DIAGNOSIS — J449 Chronic obstructive pulmonary disease, unspecified: Secondary | ICD-10-CM | POA: Diagnosis not present

## 2019-10-16 DIAGNOSIS — I10 Essential (primary) hypertension: Secondary | ICD-10-CM | POA: Diagnosis not present

## 2019-11-03 DIAGNOSIS — R490 Dysphonia: Secondary | ICD-10-CM | POA: Diagnosis not present

## 2019-11-03 DIAGNOSIS — K219 Gastro-esophageal reflux disease without esophagitis: Secondary | ICD-10-CM | POA: Diagnosis not present

## 2019-11-03 DIAGNOSIS — J343 Hypertrophy of nasal turbinates: Secondary | ICD-10-CM | POA: Diagnosis not present

## 2019-11-03 DIAGNOSIS — J310 Chronic rhinitis: Secondary | ICD-10-CM | POA: Diagnosis not present

## 2019-11-09 DIAGNOSIS — R21 Rash and other nonspecific skin eruption: Secondary | ICD-10-CM | POA: Diagnosis not present

## 2019-11-09 DIAGNOSIS — L299 Pruritus, unspecified: Secondary | ICD-10-CM | POA: Diagnosis not present

## 2019-11-12 DIAGNOSIS — R0683 Snoring: Secondary | ICD-10-CM | POA: Diagnosis not present

## 2019-11-12 DIAGNOSIS — G4730 Sleep apnea, unspecified: Secondary | ICD-10-CM | POA: Diagnosis not present

## 2019-11-12 DIAGNOSIS — R5383 Other fatigue: Secondary | ICD-10-CM | POA: Diagnosis not present

## 2019-11-16 DIAGNOSIS — Z87891 Personal history of nicotine dependence: Secondary | ICD-10-CM | POA: Diagnosis not present

## 2019-11-16 DIAGNOSIS — E1165 Type 2 diabetes mellitus with hyperglycemia: Secondary | ICD-10-CM | POA: Diagnosis not present

## 2019-11-16 DIAGNOSIS — J4541 Moderate persistent asthma with (acute) exacerbation: Secondary | ICD-10-CM | POA: Diagnosis not present

## 2019-11-16 DIAGNOSIS — Z794 Long term (current) use of insulin: Secondary | ICD-10-CM | POA: Diagnosis not present

## 2019-11-16 DIAGNOSIS — I10 Essential (primary) hypertension: Secondary | ICD-10-CM | POA: Diagnosis not present

## 2019-11-23 DIAGNOSIS — H40013 Open angle with borderline findings, low risk, bilateral: Secondary | ICD-10-CM | POA: Diagnosis not present

## 2019-11-23 DIAGNOSIS — H5213 Myopia, bilateral: Secondary | ICD-10-CM | POA: Diagnosis not present

## 2019-11-23 DIAGNOSIS — Z961 Presence of intraocular lens: Secondary | ICD-10-CM | POA: Diagnosis not present

## 2019-11-23 DIAGNOSIS — H52203 Unspecified astigmatism, bilateral: Secondary | ICD-10-CM | POA: Diagnosis not present

## 2019-11-23 DIAGNOSIS — H04123 Dry eye syndrome of bilateral lacrimal glands: Secondary | ICD-10-CM | POA: Diagnosis not present

## 2019-11-23 DIAGNOSIS — H524 Presbyopia: Secondary | ICD-10-CM | POA: Diagnosis not present

## 2019-11-23 DIAGNOSIS — E119 Type 2 diabetes mellitus without complications: Secondary | ICD-10-CM | POA: Diagnosis not present

## 2019-11-23 DIAGNOSIS — H26493 Other secondary cataract, bilateral: Secondary | ICD-10-CM | POA: Diagnosis not present

## 2019-12-15 DIAGNOSIS — Z1231 Encounter for screening mammogram for malignant neoplasm of breast: Secondary | ICD-10-CM | POA: Diagnosis not present

## 2019-12-16 DIAGNOSIS — E1165 Type 2 diabetes mellitus with hyperglycemia: Secondary | ICD-10-CM | POA: Diagnosis not present

## 2019-12-16 DIAGNOSIS — Z87891 Personal history of nicotine dependence: Secondary | ICD-10-CM | POA: Diagnosis not present

## 2019-12-16 DIAGNOSIS — Z794 Long term (current) use of insulin: Secondary | ICD-10-CM | POA: Diagnosis not present

## 2019-12-16 DIAGNOSIS — I10 Essential (primary) hypertension: Secondary | ICD-10-CM | POA: Diagnosis not present

## 2019-12-16 DIAGNOSIS — J4541 Moderate persistent asthma with (acute) exacerbation: Secondary | ICD-10-CM | POA: Diagnosis not present

## 2019-12-23 DIAGNOSIS — I10 Essential (primary) hypertension: Secondary | ICD-10-CM | POA: Diagnosis not present

## 2019-12-23 DIAGNOSIS — E1165 Type 2 diabetes mellitus with hyperglycemia: Secondary | ICD-10-CM | POA: Diagnosis not present

## 2019-12-23 DIAGNOSIS — E7800 Pure hypercholesterolemia, unspecified: Secondary | ICD-10-CM | POA: Diagnosis not present

## 2019-12-23 DIAGNOSIS — J45909 Unspecified asthma, uncomplicated: Secondary | ICD-10-CM | POA: Diagnosis not present

## 2019-12-23 DIAGNOSIS — R809 Proteinuria, unspecified: Secondary | ICD-10-CM | POA: Diagnosis not present

## 2019-12-23 DIAGNOSIS — Z7952 Long term (current) use of systemic steroids: Secondary | ICD-10-CM | POA: Diagnosis not present

## 2020-01-05 DIAGNOSIS — B351 Tinea unguium: Secondary | ICD-10-CM | POA: Diagnosis not present

## 2020-01-05 DIAGNOSIS — M79676 Pain in unspecified toe(s): Secondary | ICD-10-CM | POA: Diagnosis not present

## 2020-01-05 DIAGNOSIS — E1142 Type 2 diabetes mellitus with diabetic polyneuropathy: Secondary | ICD-10-CM | POA: Diagnosis not present

## 2020-01-15 DIAGNOSIS — J4541 Moderate persistent asthma with (acute) exacerbation: Secondary | ICD-10-CM | POA: Diagnosis not present

## 2020-01-15 DIAGNOSIS — E1165 Type 2 diabetes mellitus with hyperglycemia: Secondary | ICD-10-CM | POA: Diagnosis not present

## 2020-01-15 DIAGNOSIS — Z87891 Personal history of nicotine dependence: Secondary | ICD-10-CM | POA: Diagnosis not present

## 2020-01-15 DIAGNOSIS — Z794 Long term (current) use of insulin: Secondary | ICD-10-CM | POA: Diagnosis not present

## 2020-01-15 DIAGNOSIS — I10 Essential (primary) hypertension: Secondary | ICD-10-CM | POA: Diagnosis not present

## 2020-02-01 DIAGNOSIS — Z6829 Body mass index (BMI) 29.0-29.9, adult: Secondary | ICD-10-CM | POA: Diagnosis not present

## 2020-02-01 DIAGNOSIS — Z9181 History of falling: Secondary | ICD-10-CM | POA: Diagnosis not present

## 2020-02-01 DIAGNOSIS — W19XXXA Unspecified fall, initial encounter: Secondary | ICD-10-CM | POA: Diagnosis not present

## 2020-02-01 DIAGNOSIS — M533 Sacrococcygeal disorders, not elsewhere classified: Secondary | ICD-10-CM | POA: Diagnosis not present

## 2020-02-01 DIAGNOSIS — M47816 Spondylosis without myelopathy or radiculopathy, lumbar region: Secondary | ICD-10-CM | POA: Diagnosis not present

## 2020-02-16 DIAGNOSIS — B351 Tinea unguium: Secondary | ICD-10-CM | POA: Diagnosis not present

## 2020-02-16 DIAGNOSIS — I10 Essential (primary) hypertension: Secondary | ICD-10-CM | POA: Diagnosis not present

## 2020-02-16 DIAGNOSIS — J45909 Unspecified asthma, uncomplicated: Secondary | ICD-10-CM | POA: Diagnosis not present

## 2020-02-16 DIAGNOSIS — E1165 Type 2 diabetes mellitus with hyperglycemia: Secondary | ICD-10-CM | POA: Diagnosis not present

## 2020-02-16 DIAGNOSIS — E782 Mixed hyperlipidemia: Secondary | ICD-10-CM | POA: Diagnosis not present

## 2020-02-16 DIAGNOSIS — Z6829 Body mass index (BMI) 29.0-29.9, adult: Secondary | ICD-10-CM | POA: Diagnosis not present

## 2020-02-16 DIAGNOSIS — Z794 Long term (current) use of insulin: Secondary | ICD-10-CM | POA: Diagnosis not present

## 2020-02-16 DIAGNOSIS — F411 Generalized anxiety disorder: Secondary | ICD-10-CM | POA: Diagnosis not present

## 2020-02-16 DIAGNOSIS — J4541 Moderate persistent asthma with (acute) exacerbation: Secondary | ICD-10-CM | POA: Diagnosis not present

## 2020-02-16 DIAGNOSIS — R109 Unspecified abdominal pain: Secondary | ICD-10-CM | POA: Diagnosis not present

## 2020-03-01 DIAGNOSIS — R251 Tremor, unspecified: Secondary | ICD-10-CM | POA: Diagnosis not present

## 2020-03-01 DIAGNOSIS — R2689 Other abnormalities of gait and mobility: Secondary | ICD-10-CM | POA: Diagnosis not present

## 2020-03-01 DIAGNOSIS — Z6829 Body mass index (BMI) 29.0-29.9, adult: Secondary | ICD-10-CM | POA: Diagnosis not present

## 2020-03-03 DIAGNOSIS — E1165 Type 2 diabetes mellitus with hyperglycemia: Secondary | ICD-10-CM | POA: Diagnosis not present

## 2020-03-03 DIAGNOSIS — I10 Essential (primary) hypertension: Secondary | ICD-10-CM | POA: Diagnosis not present

## 2020-03-03 DIAGNOSIS — R946 Abnormal results of thyroid function studies: Secondary | ICD-10-CM | POA: Diagnosis not present

## 2020-03-03 DIAGNOSIS — E782 Mixed hyperlipidemia: Secondary | ICD-10-CM | POA: Diagnosis not present

## 2020-03-03 DIAGNOSIS — R5383 Other fatigue: Secondary | ICD-10-CM | POA: Diagnosis not present

## 2020-03-03 DIAGNOSIS — Z7952 Long term (current) use of systemic steroids: Secondary | ICD-10-CM | POA: Diagnosis not present

## 2020-03-03 DIAGNOSIS — R911 Solitary pulmonary nodule: Secondary | ICD-10-CM | POA: Diagnosis not present

## 2020-03-07 DIAGNOSIS — E1165 Type 2 diabetes mellitus with hyperglycemia: Secondary | ICD-10-CM | POA: Diagnosis not present

## 2020-03-07 DIAGNOSIS — Z23 Encounter for immunization: Secondary | ICD-10-CM | POA: Diagnosis not present

## 2020-03-07 DIAGNOSIS — Z6829 Body mass index (BMI) 29.0-29.9, adult: Secondary | ICD-10-CM | POA: Diagnosis not present

## 2020-03-07 DIAGNOSIS — I10 Essential (primary) hypertension: Secondary | ICD-10-CM | POA: Diagnosis not present

## 2020-03-07 DIAGNOSIS — R109 Unspecified abdominal pain: Secondary | ICD-10-CM | POA: Diagnosis not present

## 2020-03-07 DIAGNOSIS — Z0001 Encounter for general adult medical examination with abnormal findings: Secondary | ICD-10-CM | POA: Diagnosis not present

## 2020-03-07 DIAGNOSIS — J45909 Unspecified asthma, uncomplicated: Secondary | ICD-10-CM | POA: Diagnosis not present

## 2020-03-07 DIAGNOSIS — E782 Mixed hyperlipidemia: Secondary | ICD-10-CM | POA: Diagnosis not present

## 2020-03-08 DIAGNOSIS — Z23 Encounter for immunization: Secondary | ICD-10-CM | POA: Diagnosis not present

## 2020-03-11 DIAGNOSIS — R251 Tremor, unspecified: Secondary | ICD-10-CM | POA: Diagnosis not present

## 2020-03-11 DIAGNOSIS — G9389 Other specified disorders of brain: Secondary | ICD-10-CM | POA: Diagnosis not present

## 2020-03-11 DIAGNOSIS — J3489 Other specified disorders of nose and nasal sinuses: Secondary | ICD-10-CM | POA: Diagnosis not present

## 2020-03-11 DIAGNOSIS — R269 Unspecified abnormalities of gait and mobility: Secondary | ICD-10-CM | POA: Diagnosis not present

## 2020-03-11 DIAGNOSIS — R490 Dysphonia: Secondary | ICD-10-CM | POA: Diagnosis not present

## 2020-03-11 DIAGNOSIS — J320 Chronic maxillary sinusitis: Secondary | ICD-10-CM | POA: Diagnosis not present

## 2020-03-11 DIAGNOSIS — J323 Chronic sphenoidal sinusitis: Secondary | ICD-10-CM | POA: Diagnosis not present

## 2020-03-11 DIAGNOSIS — R9089 Other abnormal findings on diagnostic imaging of central nervous system: Secondary | ICD-10-CM | POA: Diagnosis not present

## 2020-03-17 DIAGNOSIS — Z794 Long term (current) use of insulin: Secondary | ICD-10-CM | POA: Diagnosis not present

## 2020-03-17 DIAGNOSIS — R251 Tremor, unspecified: Secondary | ICD-10-CM | POA: Diagnosis not present

## 2020-03-17 DIAGNOSIS — I10 Essential (primary) hypertension: Secondary | ICD-10-CM | POA: Diagnosis not present

## 2020-03-17 DIAGNOSIS — R2689 Other abnormalities of gait and mobility: Secondary | ICD-10-CM | POA: Diagnosis not present

## 2020-03-17 DIAGNOSIS — E1165 Type 2 diabetes mellitus with hyperglycemia: Secondary | ICD-10-CM | POA: Diagnosis not present

## 2020-03-17 DIAGNOSIS — D72829 Elevated white blood cell count, unspecified: Secondary | ICD-10-CM | POA: Diagnosis not present

## 2020-03-17 DIAGNOSIS — Z6829 Body mass index (BMI) 29.0-29.9, adult: Secondary | ICD-10-CM | POA: Diagnosis not present

## 2020-03-17 DIAGNOSIS — F329 Major depressive disorder, single episode, unspecified: Secondary | ICD-10-CM | POA: Diagnosis not present

## 2020-03-18 DIAGNOSIS — R300 Dysuria: Secondary | ICD-10-CM | POA: Diagnosis not present

## 2020-03-28 DIAGNOSIS — J0100 Acute maxillary sinusitis, unspecified: Secondary | ICD-10-CM | POA: Diagnosis not present

## 2020-03-28 DIAGNOSIS — G9389 Other specified disorders of brain: Secondary | ICD-10-CM | POA: Diagnosis not present

## 2020-03-28 DIAGNOSIS — R443 Hallucinations, unspecified: Secondary | ICD-10-CM | POA: Diagnosis not present

## 2020-03-28 DIAGNOSIS — I6782 Cerebral ischemia: Secondary | ICD-10-CM | POA: Diagnosis not present

## 2020-03-28 DIAGNOSIS — R251 Tremor, unspecified: Secondary | ICD-10-CM | POA: Diagnosis not present

## 2020-03-28 DIAGNOSIS — R490 Dysphonia: Secondary | ICD-10-CM | POA: Diagnosis not present

## 2020-03-28 DIAGNOSIS — R2689 Other abnormalities of gait and mobility: Secondary | ICD-10-CM | POA: Diagnosis not present

## 2020-03-28 DIAGNOSIS — R9089 Other abnormal findings on diagnostic imaging of central nervous system: Secondary | ICD-10-CM | POA: Diagnosis not present

## 2020-04-06 DIAGNOSIS — D72829 Elevated white blood cell count, unspecified: Secondary | ICD-10-CM | POA: Diagnosis not present

## 2020-04-15 DIAGNOSIS — R251 Tremor, unspecified: Secondary | ICD-10-CM | POA: Diagnosis not present

## 2020-04-15 DIAGNOSIS — Z6829 Body mass index (BMI) 29.0-29.9, adult: Secondary | ICD-10-CM | POA: Diagnosis not present

## 2020-04-15 DIAGNOSIS — F329 Major depressive disorder, single episode, unspecified: Secondary | ICD-10-CM | POA: Diagnosis not present

## 2020-04-15 DIAGNOSIS — G629 Polyneuropathy, unspecified: Secondary | ICD-10-CM | POA: Diagnosis not present

## 2020-04-15 DIAGNOSIS — R2689 Other abnormalities of gait and mobility: Secondary | ICD-10-CM | POA: Diagnosis not present

## 2020-04-16 DIAGNOSIS — Z794 Long term (current) use of insulin: Secondary | ICD-10-CM | POA: Diagnosis not present

## 2020-04-16 DIAGNOSIS — E1165 Type 2 diabetes mellitus with hyperglycemia: Secondary | ICD-10-CM | POA: Diagnosis not present

## 2020-04-16 DIAGNOSIS — I10 Essential (primary) hypertension: Secondary | ICD-10-CM | POA: Diagnosis not present

## 2020-05-03 DIAGNOSIS — Z23 Encounter for immunization: Secondary | ICD-10-CM | POA: Diagnosis not present

## 2020-05-10 ENCOUNTER — Encounter: Payer: Self-pay | Admitting: Neurology

## 2020-05-10 ENCOUNTER — Encounter: Payer: Self-pay | Admitting: *Deleted

## 2020-05-10 ENCOUNTER — Ambulatory Visit: Payer: Medicare HMO | Admitting: Neurology

## 2020-05-10 VITALS — BP 129/77 | HR 100 | Ht 63.0 in | Wt 175.0 lb

## 2020-05-10 DIAGNOSIS — R9089 Other abnormal findings on diagnostic imaging of central nervous system: Secondary | ICD-10-CM | POA: Diagnosis not present

## 2020-05-10 DIAGNOSIS — B351 Tinea unguium: Secondary | ICD-10-CM | POA: Diagnosis not present

## 2020-05-10 DIAGNOSIS — R269 Unspecified abnormalities of gait and mobility: Secondary | ICD-10-CM | POA: Diagnosis not present

## 2020-05-10 DIAGNOSIS — L03039 Cellulitis of unspecified toe: Secondary | ICD-10-CM | POA: Diagnosis not present

## 2020-05-10 MED ORDER — DONEPEZIL HCL 10 MG PO TABS: 10.0000 mg | ORAL_TABLET | Freq: Every day | ORAL | 11 refills | Status: AC

## 2020-05-10 NOTE — Progress Notes (Signed)
Chief Complaint  Patient presents with   Tremors    New Pt sister- Renee, "brown spots on MRI, hallucinations, shuffline feet"    HISTORICAL  Emily Humphrey is a 69 year old female, seen in request by her primary care PA Lanelle Bal for evaluation of visual hallucination, abnormal MRI scan, unsteady gait, she is accompanied by her sister Emily Humphrey at today's clinical visit May 10, 2020  I reviewed and summarized the referring note. PMHx. DM HLD. Smoke 2ppd x 20 years, quit 1996. 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 due to health reasons, asthma, COPD, difficulty breathing, since retirement, he become very sedentary, gradually getting worse, also suffered long history of chronic insomnia, used to take Ambien, getting worse since September, was switched to Valium 10 mg every night without significant help  She reported gradual decline functional status, especially since September, she noticed visual hallucinations, she saw her deceased parents at her house, her grandchildren visiting her, while he was actually at [place]  He also noticed mildly unsteady gait, but no tremor, slow worsening memory loss, Mini-Mental Status Examination today is 28 out of 30, she had poor sense of smell for many years, she contributed to her long history of smoking, and chronic sinusitis, previous sinus surgery, denies constipation, has chronic diarrhea, does have positional related dizziness.  She had MRI of the brain at [facility] in October 2021 only have access to the report, described widespread chronic small vessel disease, Abnormal T2 hyperintense signal involving the thalami, midbrain and pons is unchanged and nonspecific. Differential includes chronic  microvascular ischemic changes, central/extra pontine myelinolysis  and alternative demyelination pathologies.  Prominent T2 hyperintensity in the pons, usually advanced chronic  small vessel  ischemia. Given the history and milder supratentorial  white matter disease, consider postcontrast imaging. Chronic sinusitis mainly of the maxillary and right sphenoid  cells. Prior endoscopic sinus surgery.    REVIEW OF SYSTEMS: Full 14 system review of systems performed and notable only for as above All other review of systems were negative.  ALLERGIES: Allergies  Allergen Reactions   Atorvastatin     Other reaction(s): Muscle Pain   Penicillins Itching    hives   Sulfa Antibiotics Itching    rash    HOME MEDICATIONS: Current Outpatient Medications  Medication Sig Dispense Refill   albuterol (PROVENTIL) (2.5 MG/3ML) 0.083% nebulizer solution Take 2.5 mg by nebulization every 4 (four) hours as needed for wheezing or shortness of breath.     albuterol (VENTOLIN HFA) 108 (90 Base) MCG/ACT inhaler Inhale 2 puffs into the lungs every 4 (four) hours as needed.     CALCIUM PO Take 1 tablet by mouth daily.     dicyclomine (BENTYL) 20 MG tablet Take 20 mg by mouth 3 (three) times daily as needed for spasms.     DULoxetine (CYMBALTA) 30 MG capsule Take 30 mg by mouth daily.     DULoxetine (CYMBALTA) 60 MG capsule      famotidine (PEPCID) 20 MG tablet      Fluticasone-Umeclidin-Vilant (TRELEGY ELLIPTA) 200-62.5-25 MCG/INH AEPB Inhale into the lungs.     hydrocortisone 2.5 % cream Apply 1 application topically 3 (three) times daily as needed.     insulin NPH-regular Human (70-30) 100 UNIT/ML injection Inject 50 Units into the skin daily with breakfast. Plus 20 u in evening     loratadine (CLARITIN) 10 MG tablet Take 10 mg by mouth daily.  metFORMIN (GLUMETZA) 500 MG (MOD) 24 hr tablet Take 1,000 mg by mouth daily.     pantoprazole (PROTONIX) 40 MG tablet Take 1 tablet by mouth Daily.     predniSONE (DELTASONE) 10 MG tablet Take 10 mg by mouth daily.     simvastatin (ZOCOR) 20 MG tablet Take 20 mg by mouth daily.     traMADol (ULTRAM) 50 MG tablet Take 50 mg by mouth 3  (three) times daily as needed.     diazepam (VALIUM) 10 MG tablet Take 1 tablet by mouth at bedtime. (Patient not taking: Reported on 05/10/2020)     OXYGEN-HELIUM IN Inhale 2 L into the lungs at bedtime as needed.   (Patient not taking: Reported on 05/10/2020)     zolpidem (AMBIEN) 10 MG tablet Take 10 mg by mouth at bedtime as needed for sleep. (Patient not taking: Reported on 05/10/2020)     No current facility-administered medications for this visit.    PAST MEDICAL HISTORY: Past Medical History:  Diagnosis Date   Abnormal gait    Asthma    Chest pain    Hospital, May, 2012,  Pericarditis   COPD (chronic obstructive pulmonary disease) ([place])    Chronic steroid use   Dyslipidemia    Ejection fraction    Normal, echo, [id number]   GERD (gastroesophageal reflux disease)    History of tobacco abuse    IDDM (insulin dependent diabetes mellitus)    Morbid obesity ([place])    OSA (obstructive sleep apnea)    mild/not using C-PAP    Pericardial effusion    Small, echo, circumferential, May, 2012   Pericarditis    Hospitalization, May, 2012   Pneumonia 2010   Tremor     PAST SURGICAL HISTORY: Past Surgical History:  Procedure Laterality Date   ABDOMINAL HYSTERECTOMY     NASAL SINUS SURGERY     Removal of throat nodules     vocal cored nodules    FAMILY HISTORY: Family History  Problem Relation Age of Onset   Heart attack Mother        deceased at age 19   Asthma Mother    Other Father        deceased at age 34   Diabetes Sister     SOCIAL HISTORY: Social History   Socioeconomic History   Marital status: Divorced    Spouse name: Not on file   Number of children: 2   Years of education: 12   Highest education level: Not on file  Occupational History   Occupation: RETIRED    Comment: 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   Caffeine 1 c daily   Social Determinants of Health   Financial Resource Strain:    Difficulty of Paying Living Expenses: Not on file  Food Insecurity:    Worried About [place] in the Last Year: Not on  file   [place] in the Last Year: Not on file  Transportation Needs:    Lack of Transportation (Medical): Not on file   Lack of Transportation (Non-Medical): Not on file  Physical Activity:    Days of Exercise per Week: Not on file   Minutes of Exercise per Session: Not on file  Stress:    Feeling of Stress : Not on file  Social Connections:    Frequency of Communication with Friends and Family: Not on file   Frequency of Social Gatherings with Friends and Family: Not on file   Attends Religious Services: Not on file   Active Member of Clubs or Organizations: Not on file   Attends Archivist Meetings: Not on file   Marital Status: Not on file  Intimate Partner Violence:    Fear of Current or Ex-Partner: Not on file   Emotionally Abused: Not on file   Physically Abused: Not on file   Sexually Abused: Not on file     PHYSICAL EXAM   Vitals:   05/10/20 1535  BP: 129/77  Pulse: 100  Weight: 175 lb (79.4 kg)  Height: 5\' 3"  (1.6 m)   Not recorded     Body mass index is 31 kg/m.  PHYSICAL EXAMNIATION:  Gen: NAD, conversant, well nourised, well groomed                     Cardiovascular: Regular rate rhythm, no peripheral edema, warm, nontender. Eyes: Conjunctivae clear without exudates or hemorrhage Neck: Supple, no carotid bruits. Pulmonary: Clear to  auscultation bilaterally   NEUROLOGICAL EXAM: MMSE - Mini Mental State Exam 05/10/2020  Orientation to time 5  Orientation to Place 4  Registration 3  Attention/ Calculation 5  Recall 2  Language- name 2 objects 2  Language- repeat 1  Language- follow 3 step command 3  Language- read & follow direction 1  Write a sentence 1  Copy design 1  Total score 28   CRANIAL NERVES: CN II: Visual fields are full to confrontation. Pupils are round equal and briskly reactive to light. CN III, IV, VI: extraocular movement are normal. No ptosis. CN V: Facial sensation is intact to light touch CN VII: Face is symmetric with normal eye closure  CN VIII: Hearing is normal to causal conversation. CN IX, X: Phonation is normal. CN XI: Head turning and shoulder shrug are intact  MOTOR: There is no pronator drift of out-stretched arms. Muscle bulk and tone are normal. Muscle strength is normal.  REFLEXES: Reflexes are hypoactive and symmetric at the biceps, triceps, knees, and absent at ankles. Plantar responses are flexor.  SENSORY: Mildly dense dependent decreased vibratory sensation pinprick, COORDINATION: There is no trunk or limb dysmetria noted.  GAIT/STANCE: She needs push-up to get up from sitting position, mildly unsteady, also limited by her big body habitus  DIAGNOSTIC DATA (LABS, IMAGING, TESTING) - I reviewed patient records, labs, notes, testing and imaging myself where available.   ASSESSMENT AND PLAN  Emily Humphrey is a 69 y.o. female   Gait abnormality Abnormal MRI of the brain Visual hallucinations  Differentiation diagnosis including central nervous system degenerative disorder, with vascular component,  She does have vascular risk factor of hypertension, hyperlipidemia, diabetes, sedentary lifestyle, COPD, previous longtime smoker,  Complete evaluation with echocardiogram, ultrasound of carotid artery  Aspirin 81 mg daily  Refer to physical therapy  Aricept 10 mg  daily  Return to clinic with MRI CD to  review in 2 to 3 months  Marcial Pacas, M.D. Ph.D.  [facility] [address] [place], [place] [address] Ph: [phone] Fax: [phone]  CC:  Lanelle Bal, PA-C [place],  [place] [address]  Lanelle Bal, PA-C

## 2020-05-10 NOTE — Patient Instructions (Signed)
Asa 81 mg daily

## 2020-05-17 DIAGNOSIS — E1165 Type 2 diabetes mellitus with hyperglycemia: Secondary | ICD-10-CM | POA: Diagnosis not present

## 2020-05-17 DIAGNOSIS — Z794 Long term (current) use of insulin: Secondary | ICD-10-CM | POA: Diagnosis not present

## 2020-05-17 DIAGNOSIS — I10 Essential (primary) hypertension: Secondary | ICD-10-CM | POA: Diagnosis not present

## 2020-05-24 DIAGNOSIS — I10 Essential (primary) hypertension: Secondary | ICD-10-CM | POA: Diagnosis not present

## 2020-05-24 DIAGNOSIS — Z6829 Body mass index (BMI) 29.0-29.9, adult: Secondary | ICD-10-CM | POA: Diagnosis not present

## 2020-06-01 DIAGNOSIS — Z1211 Encounter for screening for malignant neoplasm of colon: Secondary | ICD-10-CM | POA: Diagnosis not present

## 2020-06-17 DIAGNOSIS — E1165 Type 2 diabetes mellitus with hyperglycemia: Secondary | ICD-10-CM | POA: Diagnosis not present

## 2020-06-17 DIAGNOSIS — Z794 Long term (current) use of insulin: Secondary | ICD-10-CM | POA: Diagnosis not present

## 2020-06-17 DIAGNOSIS — I10 Essential (primary) hypertension: Secondary | ICD-10-CM | POA: Diagnosis not present

## 2020-06-27 ENCOUNTER — Telehealth: Payer: Self-pay

## 2020-06-27 NOTE — Telephone Encounter (Signed)
I do want her to proceed with echocardiogram, ultrasound of carotid arteries as we discussed during the initial evaluation  Also remind her to bring MRI of the brain CD that was done at [facility] at next visit in March 2022  If her gait abnormality has much improved, it is okay to skip physical therapy at this point

## 2020-06-27 NOTE — Telephone Encounter (Signed)
[employer] auth for the Echo: [id number] (exp. 06/27/20 to 07/24/20) for the Carotid no auth spoke to [place] Ref # [id number].  I called [phone] the vascular department and spoke with [facility] she is going to reach out to the patient to schedule.

## 2020-06-27 NOTE — Telephone Encounter (Signed)
I spoke with the patient and discussed the message from Dr Krista Blue in response to her questions. The patient understands Dr Krista Blue does still advise she get the carotid dopplers and echo. She understands she should bring her CD of MRI brain to the office visit at her next appt with Dr Krista Blue on 09/06/20. Pt stated she has it with her. She also understands that if her gait abnormality is much improved, it is ok to skip physical therapy at this point. The patient did state she has not received any calls about scheduling the echo and carotid dopplers so I told her a message would be sent to the outgoing referrals dept to check on this. The pt verbalized appreciation for the call.

## 2020-06-27 NOTE — Telephone Encounter (Signed)
Thank you so much for your help!

## 2020-06-27 NOTE — Telephone Encounter (Signed)
We received a call from this patient inquiring about some testing you had mentioned you wanted done. On her office visit notes, it mentions a echocardiogram, ultrasound of carotid artery. She said she has not heard anything about either of those tests. She also mentioned her referral to PT. She stated that they were unable to agree on a time for her to come for those appointments, but that her legs and feet are 100% better and that the referral there is not needed at this time.  She would like a call back to discuss if she should be getting scheduled for those tests.

## 2020-06-29 ENCOUNTER — Telehealth: Payer: Self-pay | Admitting: Neurology

## 2020-06-29 NOTE — Telephone Encounter (Signed)
[employer] auth: [place] spoke to [facility] Ref # [id number]. Patient is scheduled for 07/13/20.

## 2020-07-01 ENCOUNTER — Encounter (HOSPITAL_COMMUNITY): Payer: Medicare HMO

## 2020-07-01 ENCOUNTER — Other Ambulatory Visit (HOSPITAL_COMMUNITY): Payer: Medicare HMO

## 2020-07-07 ENCOUNTER — Encounter (HOSPITAL_COMMUNITY): Payer: Medicare HMO

## 2020-07-13 ENCOUNTER — Ambulatory Visit (HOSPITAL_COMMUNITY): Payer: Medicare HMO

## 2020-07-16 DIAGNOSIS — I10 Essential (primary) hypertension: Secondary | ICD-10-CM | POA: Diagnosis not present

## 2020-07-16 DIAGNOSIS — Z87891 Personal history of nicotine dependence: Secondary | ICD-10-CM | POA: Diagnosis not present

## 2020-07-16 DIAGNOSIS — Z794 Long term (current) use of insulin: Secondary | ICD-10-CM | POA: Diagnosis not present

## 2020-07-16 DIAGNOSIS — J4541 Moderate persistent asthma with (acute) exacerbation: Secondary | ICD-10-CM | POA: Diagnosis not present

## 2020-07-16 DIAGNOSIS — E1165 Type 2 diabetes mellitus with hyperglycemia: Secondary | ICD-10-CM | POA: Diagnosis not present

## 2020-07-29 DIAGNOSIS — Z20828 Contact with and (suspected) exposure to other viral communicable diseases: Secondary | ICD-10-CM | POA: Diagnosis not present

## 2020-07-29 DIAGNOSIS — K529 Noninfective gastroenteritis and colitis, unspecified: Secondary | ICD-10-CM | POA: Diagnosis not present

## 2020-08-02 ENCOUNTER — Ambulatory Visit (HOSPITAL_COMMUNITY)
Admission: RE | Admit: 2020-08-02 | Discharge: 2020-08-02 | Disposition: A | Discharge status: Home / Self Care | Payer: Medicare HMO | Source: Ambulatory Visit | Attending: Neurology | Admitting: Neurology

## 2020-08-02 ENCOUNTER — Other Ambulatory Visit: Payer: Self-pay

## 2020-08-02 ENCOUNTER — Ambulatory Visit (HOSPITAL_BASED_OUTPATIENT_CLINIC_OR_DEPARTMENT_OTHER)
Admission: RE | Admit: 2020-08-02 | Discharge: 2020-08-02 | Disposition: A | Discharge status: Home / Self Care | Payer: Medicare HMO | Source: Ambulatory Visit | Attending: Neurology | Admitting: Neurology

## 2020-08-02 DIAGNOSIS — J449 Chronic obstructive pulmonary disease, unspecified: Secondary | ICD-10-CM | POA: Insufficient documentation

## 2020-08-02 DIAGNOSIS — G459 Transient cerebral ischemic attack, unspecified: Secondary | ICD-10-CM | POA: Diagnosis not present

## 2020-08-02 DIAGNOSIS — R269 Unspecified abnormalities of gait and mobility: Secondary | ICD-10-CM

## 2020-08-02 DIAGNOSIS — I10 Essential (primary) hypertension: Secondary | ICD-10-CM | POA: Insufficient documentation

## 2020-08-02 DIAGNOSIS — R9089 Other abnormal findings on diagnostic imaging of central nervous system: Secondary | ICD-10-CM

## 2020-08-02 DIAGNOSIS — Z87891 Personal history of nicotine dependence: Secondary | ICD-10-CM | POA: Insufficient documentation

## 2020-08-02 DIAGNOSIS — E785 Hyperlipidemia, unspecified: Secondary | ICD-10-CM | POA: Insufficient documentation

## 2020-08-02 NOTE — Progress Notes (Signed)
  Echocardiogram 2D Echocardiogram has been performed.  Emily Humphrey 08/02/2020, 1:29 PM

## 2020-08-03 ENCOUNTER — Telehealth: Payer: Self-pay | Admitting: Neurology

## 2020-08-03 LAB — ECHOCARDIOGRAM COMPLETE
Area-P 1/2: 3.72 cm2
Calc EF: 47.7 %
S' Lateral: 3.7 cm
Single Plane A2C EF: 50 %
Single Plane A4C EF: 46 %

## 2020-08-03 NOTE — Telephone Encounter (Signed)
Spoke with patient and informed her echocardiogram showed no significant abnormality. Patient verbalized understanding, appreciation.

## 2020-08-03 NOTE — Telephone Encounter (Signed)
Please call patient, echocardiogram showed no significant abnormality.

## 2020-08-08 DIAGNOSIS — Z01818 Encounter for other preprocedural examination: Secondary | ICD-10-CM | POA: Diagnosis not present

## 2020-08-11 DIAGNOSIS — K635 Polyp of colon: Secondary | ICD-10-CM | POA: Diagnosis not present

## 2020-08-11 DIAGNOSIS — E7800 Pure hypercholesterolemia, unspecified: Secondary | ICD-10-CM | POA: Diagnosis not present

## 2020-08-11 DIAGNOSIS — D1339 Benign neoplasm of other parts of small intestine: Secondary | ICD-10-CM | POA: Diagnosis not present

## 2020-08-11 DIAGNOSIS — J449 Chronic obstructive pulmonary disease, unspecified: Secondary | ICD-10-CM | POA: Diagnosis not present

## 2020-08-11 DIAGNOSIS — E119 Type 2 diabetes mellitus without complications: Secondary | ICD-10-CM | POA: Diagnosis not present

## 2020-08-11 DIAGNOSIS — Z1211 Encounter for screening for malignant neoplasm of colon: Secondary | ICD-10-CM | POA: Diagnosis not present

## 2020-08-11 DIAGNOSIS — K641 Second degree hemorrhoids: Secondary | ICD-10-CM | POA: Diagnosis not present

## 2020-08-11 DIAGNOSIS — K5730 Diverticulosis of large intestine without perforation or abscess without bleeding: Secondary | ICD-10-CM | POA: Diagnosis not present

## 2020-08-11 DIAGNOSIS — D126 Benign neoplasm of colon, unspecified: Secondary | ICD-10-CM | POA: Diagnosis not present

## 2020-08-11 DIAGNOSIS — D120 Benign neoplasm of cecum: Secondary | ICD-10-CM | POA: Diagnosis not present

## 2020-08-15 DIAGNOSIS — I10 Essential (primary) hypertension: Secondary | ICD-10-CM | POA: Diagnosis not present

## 2020-08-15 DIAGNOSIS — Z87891 Personal history of nicotine dependence: Secondary | ICD-10-CM | POA: Diagnosis not present

## 2020-08-15 DIAGNOSIS — J4541 Moderate persistent asthma with (acute) exacerbation: Secondary | ICD-10-CM | POA: Diagnosis not present

## 2020-08-15 DIAGNOSIS — Z794 Long term (current) use of insulin: Secondary | ICD-10-CM | POA: Diagnosis not present

## 2020-08-15 DIAGNOSIS — E1165 Type 2 diabetes mellitus with hyperglycemia: Secondary | ICD-10-CM | POA: Diagnosis not present

## 2020-08-29 DIAGNOSIS — D122 Benign neoplasm of ascending colon: Secondary | ICD-10-CM | POA: Diagnosis not present

## 2020-09-01 DIAGNOSIS — I10 Essential (primary) hypertension: Secondary | ICD-10-CM | POA: Diagnosis not present

## 2020-09-01 DIAGNOSIS — E7849 Other hyperlipidemia: Secondary | ICD-10-CM | POA: Diagnosis not present

## 2020-09-01 DIAGNOSIS — E782 Mixed hyperlipidemia: Secondary | ICD-10-CM | POA: Diagnosis not present

## 2020-09-01 DIAGNOSIS — R946 Abnormal results of thyroid function studies: Secondary | ICD-10-CM | POA: Diagnosis not present

## 2020-09-01 DIAGNOSIS — Z1159 Encounter for screening for other viral diseases: Secondary | ICD-10-CM | POA: Diagnosis not present

## 2020-09-01 DIAGNOSIS — Z7952 Long term (current) use of systemic steroids: Secondary | ICD-10-CM | POA: Diagnosis not present

## 2020-09-01 DIAGNOSIS — E1165 Type 2 diabetes mellitus with hyperglycemia: Secondary | ICD-10-CM | POA: Diagnosis not present

## 2020-09-01 DIAGNOSIS — R5383 Other fatigue: Secondary | ICD-10-CM | POA: Diagnosis not present

## 2020-09-05 DIAGNOSIS — J4541 Moderate persistent asthma with (acute) exacerbation: Secondary | ICD-10-CM | POA: Diagnosis not present

## 2020-09-05 DIAGNOSIS — R300 Dysuria: Secondary | ICD-10-CM | POA: Diagnosis not present

## 2020-09-05 DIAGNOSIS — E1165 Type 2 diabetes mellitus with hyperglycemia: Secondary | ICD-10-CM | POA: Diagnosis not present

## 2020-09-05 DIAGNOSIS — E7849 Other hyperlipidemia: Secondary | ICD-10-CM | POA: Diagnosis not present

## 2020-09-05 DIAGNOSIS — G4700 Insomnia, unspecified: Secondary | ICD-10-CM | POA: Diagnosis not present

## 2020-09-05 DIAGNOSIS — F419 Anxiety disorder, unspecified: Secondary | ICD-10-CM | POA: Diagnosis not present

## 2020-09-05 DIAGNOSIS — F32A Depression, unspecified: Secondary | ICD-10-CM | POA: Diagnosis not present

## 2020-09-05 DIAGNOSIS — Z6830 Body mass index (BMI) 30.0-30.9, adult: Secondary | ICD-10-CM | POA: Diagnosis not present

## 2020-09-06 ENCOUNTER — Encounter: Payer: Self-pay | Admitting: Neurology

## 2020-09-06 ENCOUNTER — Ambulatory Visit: Payer: Medicare HMO | Admitting: Neurology

## 2020-09-06 VITALS — BP 142/81 | HR 101 | Ht 63.0 in | Wt 175.0 lb

## 2020-09-06 DIAGNOSIS — I679 Cerebrovascular disease, unspecified: Secondary | ICD-10-CM | POA: Diagnosis not present

## 2020-09-06 DIAGNOSIS — G3184 Mild cognitive impairment, so stated: Secondary | ICD-10-CM

## 2020-09-06 DIAGNOSIS — H04123 Dry eye syndrome of bilateral lacrimal glands: Secondary | ICD-10-CM | POA: Diagnosis not present

## 2020-09-06 NOTE — Progress Notes (Signed)
Chief Complaint  Patient presents with  . Follow-up    She is here with her sister, Emily Humphrey. Reports improvement in gait to the point of declining physical therapy. She is no longer having issues with visual hallucinations. She brought previous MRI brain scans from 03/11/10 and 03/28/20.     HISTORICAL  Emily Humphrey is a 70 year old female, seen in request by her primary care PA Lanelle Bal for evaluation of visual hallucination, abnormal MRI scan, unsteady gait, she is accompanied by her sister Emily Humphrey at today's clinical visit May 10, 2020  I reviewed and summarized the referring note. PMHx. DM HLD. Smoke 2ppd x 20 years, quit 1996. 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 due to health reasons, asthma, COPD, difficulty breathing, since retirement, she become very sedentary, gradually getting worse, also suffered long history of chronic insomnia, used to take Ambien, getting worse since September, was switched to Valium 10 mg every night without significant help  She reported gradual decline functional status, especially since September, she noticed visual hallucinations, she saw her deceased parents at her house, her grandchildren visiting her, while he was actually at [place]  She also noticed mildly unsteady gait, but no tremor, slow worsening memory loss, Mini-Mental Status Examination today is 28 out of 30, she had poor sense of smell for many years, she contributed to her long history of smoking, and chronic sinusitis, previous sinus surgery, denies constipation, has chronic diarrhea, does have positional related dizziness.  She had MRI of the brain at [facility] in October 2021 only have access to the report, described widespread chronic small vessel disease, Abnormal T2 hyperintense signal involving the thalami, midbrain and pons is unchanged and nonspecific. Differential includes chronic  microvascular ischemic changes,  central/extra pontine myelinolysis  and alternative demyelination pathologies.  Prominent T2 hyperintensity in the pons, usually advanced chronic  small vessel ischemia. Given the history and milder supratentorial  white matter disease, consider postcontrast imaging. Chronic sinusitis mainly of the maxillary and right sphenoid  cells. Prior endoscopic sinus surgery.    UPDATE September 06 2020: She is accompanied by her sister at today's visit, she is overall doing much better, taking Aricept 10 mg daily, no longer has visual hallucinations, sister reported that around the time she was experienced visual hallucinations, she just recovered from her UTI  We personally reviewed MRI of the brain from [facility] system in October 2021, no acute abnormality, mild generalized atrophy, moderate supratentorium small vessel disease, there was no contrast-enhancement  Echocardiogram, showed no significant abnormality Ultrasound of carotid artery in February 2022 showed less than 39% stenosis of bilateral internal carotid artery  She was started on Aricept 10 mg every night following initial visit in November 2021, tolerating it well,   REVIEW OF SYSTEMS: Full 14 system review of systems performed and notable only for as above All other review of systems were negative.  ALLERGIES: Allergies  Allergen Reactions  . Atorvastatin     Other reaction(s): Muscle Pain  . Penicillins Itching    hives  . Sulfa Antibiotics Itching    rash    HOME MEDICATIONS: Current Outpatient Medications  Medication Sig Dispense Refill  . albuterol (PROVENTIL) (2.5 MG/3ML) 0.083% nebulizer solution Take 2.5 mg by nebulization every 4 (four) hours as needed for wheezing or shortness of breath.    Marland Kitchen albuterol (VENTOLIN HFA) 108 (90 Base) MCG/ACT inhaler Inhale 2 puffs into the lungs every 4 (four) hours as  needed.    Marland Kitchen CALCIUM PO Take 1 tablet by mouth daily.    . diazepam (VALIUM) 10 MG tablet Take 1 tablet by mouth at bedtime.     . dicyclomine (BENTYL) 20 MG tablet Take 20 mg by mouth 3 (three) times daily as needed for spasms.    Marland Kitchen donepezil (ARICEPT) 10 MG tablet Take 1 tablet (10 mg total) by mouth at bedtime. 30 tablet 11  . DULoxetine (CYMBALTA) 30 MG capsule Take 30 mg by mouth daily.    . DULoxetine (CYMBALTA) 60 MG capsule     . famotidine (PEPCID) 20 MG tablet     . Fluticasone-Umeclidin-Vilant (TRELEGY ELLIPTA) 200-62.5-25 MCG/INH AEPB Inhale into the lungs.    . insulin NPH-regular Human (70-30) 100 UNIT/ML injection Inject 50 Units into the skin daily with breakfast. Plus 20 u in evening    . metFORMIN (GLUMETZA) 500 MG (MOD) 24 hr tablet Take 1,000 mg by mouth daily.    . pantoprazole (PROTONIX) 40 MG tablet Take 1 tablet by mouth Daily.    . predniSONE (DELTASONE) 10 MG tablet Take 10 mg by mouth daily.    . simvastatin (ZOCOR) 20 MG tablet Take 20 mg by mouth daily.    . traMADol (ULTRAM) 50 MG tablet Take 50 mg by mouth 3 (three) times daily as needed.     No current facility-administered medications for this visit.    PAST MEDICAL HISTORY: Past Medical History:  Diagnosis Date  . Abnormal gait   . Asthma   . Chest pain    Hospital, May, 2012,  Pericarditis  . COPD (chronic obstructive pulmonary disease) (HCC)    Chronic steroid use  . Dyslipidemia   . Ejection fraction    Normal, echo, [id number]  . GERD (gastroesophageal reflux disease)   . History of tobacco abuse   . IDDM (insulin dependent diabetes mellitus)   . Morbid obesity ([place])   . OSA (obstructive sleep apnea)    mild/not using C-PAP   . Pericardial effusion    Small, echo, circumferential, May, 2012  . Pericarditis    Hospitalization, May, 2012  . Pneumonia 2010  . Tremor     PAST SURGICAL HISTORY: Past Surgical History:  Procedure Laterality Date  . ABDOMINAL HYSTERECTOMY    . NASAL SINUS SURGERY    . Removal of throat nodules     vocal cored nodules    FAMILY HISTORY: Family History  Problem Relation Age of  Onset  . Heart attack Mother        deceased at age 78  . Asthma Mother   . Other Father        deceased at age 6  . Diabetes Sister     SOCIAL HISTORY: Social History   Socioeconomic History  . Marital status: Divorced    Spouse name: Not on file  . Number of children: 2  . Years of education: 78  . Highest education level: Not on file  Occupational History  . 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 Smoking status: Former Smoker    Packs/day: 1.00    Years: 20.00    Pack years: 20.00    Types: Cigarettes    Quit date: 11/17/1994    Years since quitting: 25.8  . Smokeless tobacco: Never Used  Substance and Sexual Activity  . Alcohol use: Not Currently    Comment: drinks on rare occasions  .  Drug use: No  . Sexual activity: Not on file  Other Topics Concern  . Not on file  Social History Narrative   Lives in [place] alone.   Caffeine 1 c daily   Social Determinants of Health   Financial Resource Strain: Not on file  Food Insecurity: Not on file  Transportation Needs: Not on file  Physical Activity: Not on file  Stress: Not on file  Social Connections: Not on file  Intimate Partner Violence: Not on file     PHYSICAL EXAM   Vitals:   09/06/20 1415  BP: (!) 142/81  Pulse: (!) 101  Weight: 175 lb (79.4 kg)  Height: 5\' 3"  (1.6 m)   Not recorded     Body mass index is 31 kg/m.  PHYSICAL EXAMNIATION:  Gen: NAD, conversant, well nourised, well groomed        NEUROLOGICAL EXAM: MMSE - Mini Mental State Exam 05/10/2020  Orientation to time 5  Orientation to Place 4  Registration 3  Attention/ Calculation 5  Recall 2  Language- name 2 objects 2  Language- repeat 1  Language- follow 3 step command 3  Language- read & follow direction 1  Write a sentence 1  Copy design 1  Total score 28   CRANIAL NERVES: CN II: Visual fields are full to confrontation. Pupils are round  equal and briskly reactive to light. CN III, IV, VI: extraocular movement are normal. No ptosis. CN V: Facial sensation is intact to light touch CN VII: Face is symmetric with normal eye closure  CN VIII: Hearing is normal to causal conversation. CN IX, X: Phonation is normal. CN XI: Head turning and shoulder shrug are intact  MOTOR: There is no pronator drift of out-stretched arms. Muscle bulk and tone are normal. Muscle strength is normal.  REFLEXES: Reflexes are hypoactive and symmetric at the biceps, triceps, knees, and absent at ankles. Plantar responses are flexor.  SENSORY: Mildly dense dependent decreased vibratory sensation pinprick, COORDINATION: There is no trunk or limb dysmetria noted.  GAIT/STANCE: She can get up from seated position, arms crossed,  DIAGNOSTIC DATA (LABS, IMAGING, TESTING) - I reviewed patient records, labs, notes, testing and imaging myself where available.   ASSESSMENT AND PLAN  Emily Humphrey is a 70 y.o. female   Cerebral small vessel disease Mild cognitive impairment Visual hallucination,  She does has multiple vascular risk factor of hypertension, hyperlipidemia, diabetes, sedentary lifestyle, COPD, previous longtime smoker,  Exercise the importance of active lifestyle  Keep aspirin 81 mg daily  Keep Aricept 10 mg daily  Visual hallucination has improved following for UTI treatment, likely happened in court due to active infection in the background of mild cognitive impairment as baseline, cerebrovascular disease,    Marcial Pacas, M.D. Ph.D.  [facility] [address], [place], [place] [address] Ph: [phone] Fax: [phone]  CC:  Lanelle Bal, PA-C [place],   [id number]  Lanelle Bal, PA-C

## 2020-09-12 DIAGNOSIS — E1165 Type 2 diabetes mellitus with hyperglycemia: Secondary | ICD-10-CM | POA: Diagnosis not present

## 2020-09-12 DIAGNOSIS — L299 Pruritus, unspecified: Secondary | ICD-10-CM | POA: Diagnosis not present

## 2020-09-12 DIAGNOSIS — E1140 Type 2 diabetes mellitus with diabetic neuropathy, unspecified: Secondary | ICD-10-CM | POA: Diagnosis not present

## 2020-09-12 DIAGNOSIS — Z6830 Body mass index (BMI) 30.0-30.9, adult: Secondary | ICD-10-CM | POA: Diagnosis not present

## 2020-09-12 DIAGNOSIS — G629 Polyneuropathy, unspecified: Secondary | ICD-10-CM | POA: Diagnosis not present

## 2020-09-14 DIAGNOSIS — I10 Essential (primary) hypertension: Secondary | ICD-10-CM | POA: Diagnosis not present

## 2020-09-14 DIAGNOSIS — E1165 Type 2 diabetes mellitus with hyperglycemia: Secondary | ICD-10-CM | POA: Diagnosis not present

## 2020-09-14 DIAGNOSIS — J4541 Moderate persistent asthma with (acute) exacerbation: Secondary | ICD-10-CM | POA: Diagnosis not present

## 2020-09-14 DIAGNOSIS — Z87891 Personal history of nicotine dependence: Secondary | ICD-10-CM | POA: Diagnosis not present

## 2020-09-14 DIAGNOSIS — Z794 Long term (current) use of insulin: Secondary | ICD-10-CM | POA: Diagnosis not present

## 2020-10-03 DIAGNOSIS — E782 Mixed hyperlipidemia: Secondary | ICD-10-CM | POA: Diagnosis not present

## 2020-10-03 DIAGNOSIS — I10 Essential (primary) hypertension: Secondary | ICD-10-CM | POA: Diagnosis not present

## 2020-10-03 DIAGNOSIS — E1165 Type 2 diabetes mellitus with hyperglycemia: Secondary | ICD-10-CM | POA: Diagnosis not present

## 2020-10-03 DIAGNOSIS — Z1329 Encounter for screening for other suspected endocrine disorder: Secondary | ICD-10-CM | POA: Diagnosis not present

## 2020-10-03 DIAGNOSIS — D72829 Elevated white blood cell count, unspecified: Secondary | ICD-10-CM | POA: Diagnosis not present

## 2020-10-03 DIAGNOSIS — E7849 Other hyperlipidemia: Secondary | ICD-10-CM | POA: Diagnosis not present

## 2020-10-06 DIAGNOSIS — E1165 Type 2 diabetes mellitus with hyperglycemia: Secondary | ICD-10-CM | POA: Diagnosis not present

## 2020-10-06 DIAGNOSIS — F419 Anxiety disorder, unspecified: Secondary | ICD-10-CM | POA: Diagnosis not present

## 2020-10-06 DIAGNOSIS — G629 Polyneuropathy, unspecified: Secondary | ICD-10-CM | POA: Diagnosis not present

## 2020-10-06 DIAGNOSIS — I10 Essential (primary) hypertension: Secondary | ICD-10-CM | POA: Diagnosis not present

## 2020-10-06 DIAGNOSIS — D72829 Elevated white blood cell count, unspecified: Secondary | ICD-10-CM | POA: Diagnosis not present

## 2020-10-06 DIAGNOSIS — E7849 Other hyperlipidemia: Secondary | ICD-10-CM | POA: Diagnosis not present

## 2020-10-06 DIAGNOSIS — Z1331 Encounter for screening for depression: Secondary | ICD-10-CM | POA: Diagnosis not present

## 2020-10-06 DIAGNOSIS — Z1389 Encounter for screening for other disorder: Secondary | ICD-10-CM | POA: Diagnosis not present

## 2020-10-15 DIAGNOSIS — J4541 Moderate persistent asthma with (acute) exacerbation: Secondary | ICD-10-CM | POA: Diagnosis not present

## 2020-10-15 DIAGNOSIS — I10 Essential (primary) hypertension: Secondary | ICD-10-CM | POA: Diagnosis not present

## 2020-10-15 DIAGNOSIS — E1165 Type 2 diabetes mellitus with hyperglycemia: Secondary | ICD-10-CM | POA: Diagnosis not present

## 2020-10-15 DIAGNOSIS — Z794 Long term (current) use of insulin: Secondary | ICD-10-CM | POA: Diagnosis not present

## 2020-10-15 DIAGNOSIS — Z87891 Personal history of nicotine dependence: Secondary | ICD-10-CM | POA: Diagnosis not present

## 2020-11-07 DIAGNOSIS — H02831 Dermatochalasis of right upper eyelid: Secondary | ICD-10-CM | POA: Diagnosis not present

## 2020-11-07 DIAGNOSIS — H0102A Squamous blepharitis right eye, upper and lower eyelids: Secondary | ICD-10-CM | POA: Diagnosis not present

## 2020-11-07 DIAGNOSIS — H02203 Unspecified lagophthalmos right eye, unspecified eyelid: Secondary | ICD-10-CM | POA: Diagnosis not present

## 2020-11-07 DIAGNOSIS — H02834 Dermatochalasis of left upper eyelid: Secondary | ICD-10-CM | POA: Diagnosis not present

## 2020-11-07 DIAGNOSIS — D492 Neoplasm of unspecified behavior of bone, soft tissue, and skin: Secondary | ICD-10-CM | POA: Diagnosis not present

## 2020-11-07 DIAGNOSIS — H11823 Conjunctivochalasis, bilateral: Secondary | ICD-10-CM | POA: Diagnosis not present

## 2020-11-07 DIAGNOSIS — H04123 Dry eye syndrome of bilateral lacrimal glands: Secondary | ICD-10-CM | POA: Diagnosis not present

## 2020-11-07 DIAGNOSIS — H0102B Squamous blepharitis left eye, upper and lower eyelids: Secondary | ICD-10-CM | POA: Diagnosis not present

## 2020-11-07 DIAGNOSIS — H02206 Unspecified lagophthalmos left eye, unspecified eyelid: Secondary | ICD-10-CM | POA: Diagnosis not present

## 2020-11-08 DIAGNOSIS — R21 Rash and other nonspecific skin eruption: Secondary | ICD-10-CM | POA: Diagnosis not present

## 2020-11-08 DIAGNOSIS — J45909 Unspecified asthma, uncomplicated: Secondary | ICD-10-CM | POA: Diagnosis not present

## 2020-11-14 DIAGNOSIS — I10 Essential (primary) hypertension: Secondary | ICD-10-CM | POA: Diagnosis not present

## 2020-11-14 DIAGNOSIS — Z87891 Personal history of nicotine dependence: Secondary | ICD-10-CM | POA: Diagnosis not present

## 2020-11-14 DIAGNOSIS — Z794 Long term (current) use of insulin: Secondary | ICD-10-CM | POA: Diagnosis not present

## 2020-11-14 DIAGNOSIS — J4541 Moderate persistent asthma with (acute) exacerbation: Secondary | ICD-10-CM | POA: Diagnosis not present

## 2020-11-14 DIAGNOSIS — E1165 Type 2 diabetes mellitus with hyperglycemia: Secondary | ICD-10-CM | POA: Diagnosis not present

## 2020-12-15 DIAGNOSIS — Z794 Long term (current) use of insulin: Secondary | ICD-10-CM | POA: Diagnosis not present

## 2020-12-15 DIAGNOSIS — I10 Essential (primary) hypertension: Secondary | ICD-10-CM | POA: Diagnosis not present

## 2020-12-15 DIAGNOSIS — J4541 Moderate persistent asthma with (acute) exacerbation: Secondary | ICD-10-CM | POA: Diagnosis not present

## 2020-12-15 DIAGNOSIS — Z87891 Personal history of nicotine dependence: Secondary | ICD-10-CM | POA: Diagnosis not present

## 2020-12-15 DIAGNOSIS — E1165 Type 2 diabetes mellitus with hyperglycemia: Secondary | ICD-10-CM | POA: Diagnosis not present

## 2020-12-21 DIAGNOSIS — Z6830 Body mass index (BMI) 30.0-30.9, adult: Secondary | ICD-10-CM | POA: Diagnosis not present

## 2020-12-21 DIAGNOSIS — F411 Generalized anxiety disorder: Secondary | ICD-10-CM | POA: Diagnosis not present

## 2021-01-02 ENCOUNTER — Institutional Professional Consult (permissible substitution): Payer: Medicare HMO | Admitting: Internal Medicine

## 2021-01-11 DIAGNOSIS — D22112 Melanocytic nevi of right lower eyelid, including canthus: Secondary | ICD-10-CM | POA: Diagnosis not present

## 2021-01-11 DIAGNOSIS — D492 Neoplasm of unspecified behavior of bone, soft tissue, and skin: Secondary | ICD-10-CM | POA: Diagnosis not present

## 2021-01-15 DIAGNOSIS — I10 Essential (primary) hypertension: Secondary | ICD-10-CM | POA: Diagnosis not present

## 2021-01-15 DIAGNOSIS — Z794 Long term (current) use of insulin: Secondary | ICD-10-CM | POA: Diagnosis not present

## 2021-01-15 DIAGNOSIS — J4541 Moderate persistent asthma with (acute) exacerbation: Secondary | ICD-10-CM | POA: Diagnosis not present

## 2021-01-15 DIAGNOSIS — Z87891 Personal history of nicotine dependence: Secondary | ICD-10-CM | POA: Diagnosis not present

## 2021-01-15 DIAGNOSIS — E1165 Type 2 diabetes mellitus with hyperglycemia: Secondary | ICD-10-CM | POA: Diagnosis not present

## 2021-02-01 DIAGNOSIS — E782 Mixed hyperlipidemia: Secondary | ICD-10-CM | POA: Diagnosis not present

## 2021-02-01 DIAGNOSIS — I10 Essential (primary) hypertension: Secondary | ICD-10-CM | POA: Diagnosis not present

## 2021-02-01 DIAGNOSIS — E1165 Type 2 diabetes mellitus with hyperglycemia: Secondary | ICD-10-CM | POA: Diagnosis not present

## 2021-02-01 DIAGNOSIS — R946 Abnormal results of thyroid function studies: Secondary | ICD-10-CM | POA: Diagnosis not present

## 2021-02-01 DIAGNOSIS — E7849 Other hyperlipidemia: Secondary | ICD-10-CM | POA: Diagnosis not present

## 2021-02-15 DIAGNOSIS — I10 Essential (primary) hypertension: Secondary | ICD-10-CM | POA: Diagnosis not present

## 2021-02-15 DIAGNOSIS — Z87891 Personal history of nicotine dependence: Secondary | ICD-10-CM | POA: Diagnosis not present

## 2021-02-15 DIAGNOSIS — E1165 Type 2 diabetes mellitus with hyperglycemia: Secondary | ICD-10-CM | POA: Diagnosis not present

## 2021-02-15 DIAGNOSIS — Z794 Long term (current) use of insulin: Secondary | ICD-10-CM | POA: Diagnosis not present

## 2021-02-15 DIAGNOSIS — J4541 Moderate persistent asthma with (acute) exacerbation: Secondary | ICD-10-CM | POA: Diagnosis not present

## 2021-02-17 ENCOUNTER — Encounter: Payer: Self-pay | Admitting: Internal Medicine

## 2021-02-17 ENCOUNTER — Other Ambulatory Visit: Payer: Self-pay

## 2021-02-17 ENCOUNTER — Ambulatory Visit: Payer: Medicare HMO | Admitting: Internal Medicine

## 2021-02-17 ENCOUNTER — Ambulatory Visit (HOSPITAL_COMMUNITY)
Admission: RE | Admit: 2021-02-17 | Discharge: 2021-02-17 | Disposition: A | Discharge status: Home / Self Care | Payer: Medicare HMO | Source: Ambulatory Visit | Attending: Internal Medicine | Admitting: Internal Medicine

## 2021-02-17 VITALS — BP 112/72 | HR 107 | Temp 98.0°F | Ht 63.0 in | Wt 165.0 lb

## 2021-02-17 DIAGNOSIS — G629 Polyneuropathy, unspecified: Secondary | ICD-10-CM | POA: Diagnosis not present

## 2021-02-17 DIAGNOSIS — F419 Anxiety disorder, unspecified: Secondary | ICD-10-CM | POA: Diagnosis not present

## 2021-02-17 DIAGNOSIS — Z6829 Body mass index (BMI) 29.0-29.9, adult: Secondary | ICD-10-CM | POA: Diagnosis not present

## 2021-02-17 DIAGNOSIS — R0609 Other forms of dyspnea: Secondary | ICD-10-CM

## 2021-02-17 DIAGNOSIS — I10 Essential (primary) hypertension: Secondary | ICD-10-CM | POA: Diagnosis not present

## 2021-02-17 DIAGNOSIS — R0600 Dyspnea, unspecified: Secondary | ICD-10-CM | POA: Insufficient documentation

## 2021-02-17 DIAGNOSIS — E7849 Other hyperlipidemia: Secondary | ICD-10-CM | POA: Diagnosis not present

## 2021-02-17 DIAGNOSIS — E1165 Type 2 diabetes mellitus with hyperglycemia: Secondary | ICD-10-CM | POA: Diagnosis not present

## 2021-02-17 DIAGNOSIS — D72829 Elevated white blood cell count, unspecified: Secondary | ICD-10-CM | POA: Diagnosis not present

## 2021-02-17 MED ORDER — BREZTRI AEROSPHERE 160-9-4.8 MCG/ACT IN AERO: 2.0000 | INHALATION_SPRAY | Freq: Two times a day (BID) | RESPIRATORY_TRACT | 0 refills | Status: DC

## 2021-02-17 NOTE — Progress Notes (Signed)
Emily Humphrey, female    DOB: 03/20/1951, 70 y.o.   MRN: [id number]   Brief patient profile:  47 yowf   quit smoking 1996   self-referred to pulmonary clinic in [place]  02/17/2021 for copd with symptoms starting 2 m p quit smoking with initial dx asthma/ sinus dz at wt = 140 and daily symptoms on prednisone ever since.   Sinus surgery = dx polyps / recurrent    History of Present Illness  02/17/2021  Pulmonary/ 1st office eval/ Emily Humphrey / [place] [facility]  Chief Complaint  Patient presents with   Consult    Patient has Asthma and COPD. Shortness of breath with exertion and chest tightness. Dry cough  Dyspnea:  mb and back to house wears her out even on pred 40 Cough: dry cough  Sleep: on side bed is horizontal / 2 pillows  SABA use: avg saba hfa sev times plus neb bid  Had hb resolved on ppi bid   No obvious day to day or daytime variability or assoc excess/ purulent sputum or mucus plugs or hemoptysis or cp or chest tightness, subjective wheeze or overt sinus or hb symptoms.   Sleeping comfortably  without nocturnal  or early am exacerbation  of respiratory  c/o's or need for noct saba. Also denies any obvious fluctuation of symptoms with weather or environmental changes or other aggravating or alleviating factors except as outlined above   No unusual exposure hx or h/o childhood pna/ asthma or knowledge of premature birth.  Current Allergies, Complete Past Medical History, Past Surgical History, Family History, and Social History were reviewed in [employer] record.  ROS  The following are not active complaints unless bolded Hoarseness, sore throat, dysphagia, dental problems, itching, sneezing,  nasal congestion or discharge of excess mucus or purulent secretions, ear ache,   fever, chills, sweats, unintended wt loss or wt gain, classically pleuritic or exertional cp,  orthopnea pnd or arm/hand swelling  or leg swelling, presyncope, palpitations, abdominal pain,  anorexia, nausea, vomiting, diarrhea  or change in bowel habits or change in bladder habits, change in stools or change in urine, dysuria, hematuria,  rash, arthralgias, visual complaints, headache, numbness, weakness or ataxia or problems with walking or coordination,  change in mood = more anxious or  memory.           Past Medical History:  Diagnosis Date   Abnormal gait    Asthma    Chest pain    Hospital, May, 2012,  Pericarditis   COPD (chronic obstructive pulmonary disease) ([place])    Chronic steroid use   Dyslipidemia    Ejection fraction    Normal, echo, [id number]   GERD (gastroesophageal reflux disease)    History of tobacco abuse    IDDM (insulin dependent diabetes mellitus)    Morbid obesity ([place])    OSA (obstructive sleep apnea)    mild/not using C-PAP    Pericardial effusion    Small, echo, circumferential, May, 2012   Pericarditis    Hospitalization, May, 2012   Pneumonia 2010   Tremor     Outpatient Medications Prior to Visit  Medication Sig Dispense Refill   albuterol (PROVENTIL) (2.5 MG/3ML) 0.083% nebulizer solution Take 2.5 mg by nebulization every 4 (four) hours as needed for wheezing or shortness of breath.     albuterol (VENTOLIN HFA) 108 (90 Base) MCG/ACT inhaler Inhale 2 puffs into the lungs every 4 (four) hours as needed.     CALCIUM PO Take  1 tablet by mouth daily.     diazepam (VALIUM) 10 MG tablet Take 1 tablet by mouth at bedtime.     dicyclomine (BENTYL) 20 MG tablet Take 20 mg by mouth 3 (three) times daily as needed for spasms.     donepezil (ARICEPT) 10 MG tablet Take 1 tablet (10 mg total) by mouth at bedtime. 30 tablet 11   DULoxetine (CYMBALTA) 30 MG capsule Take 30 mg by mouth daily.     DULoxetine (CYMBALTA) 60 MG capsule      famotidine (PEPCID) 20 MG tablet      Fluticasone-Umeclidin-Vilant (TRELEGY ELLIPTA) 200-62.5-25 MCG/INH AEPB Inhale into the lungs.     gabapentin (NEURONTIN) 600 MG tablet Take 600 mg by mouth 3 (three) times daily.      insulin NPH-regular Human (70-30) 100 UNIT/ML injection Inject 50 Units into the skin daily with breakfast. Plus 20 u in evening     metFORMIN (GLUMETZA) 500 MG (MOD) 24 hr tablet Take 1,000 mg by mouth daily.     pantoprazole (PROTONIX) 40 MG tablet Take 1 tablet by mouth Daily.     predniSONE (DELTASONE) 10 MG tablet Take 10 mg by mouth daily.     simvastatin (ZOCOR) 20 MG tablet Take 20 mg by mouth daily.     traMADol (ULTRAM) 50 MG tablet Take 50 mg by mouth 3 (three) times daily as needed.     No facility-administered medications prior to visit.     Objective:     BP 112/72 (BP Location: Left Arm, Patient Position: Sitting, Cuff Size: Normal)   Pulse (!) 107   Temp 98 F (36.7 C) (Oral)   Ht '5\' 3"'$  (1.6 m)   Wt 165 lb (74.8 kg)   SpO2 100%   BMI 29.23 kg/m   SpO2: 100 %  Obese pleasant amb wf nad slt nasal tone   HEENT : pt wearing mask not removed for exam due to covid - 19 concerns.   NECK :  without JVD/Nodes/TM/ nl carotid upstrokes bilaterally   LUNGS: no acc muscle use,  Min barrel  contour chest wall with bilateral  slightly decreased bs s audible wheeze and  without cough on insp or exp maneuvers and min  Hyperresonant  to  percussion bilaterally     CV:  RRR  no s3 or murmur or increase in P2, and no edema   ABD: obese  soft and nontender with pos end  insp Hoover's  in the supine position. No bruits or organomegaly appreciated, bowel sounds nl  MS:   Nl gait/  ext warm without deformities, calf tenderness, cyanosis or clubbing No obvious joint restrictions   SKIN: warm and dry without lesions    NEURO:  alert, approp, nl sensorium with  no motor or cerebellar deficits apparent.       CXR PA and Lateral:   02/17/2021 :    I personally reviewed images and impression as follows:    Wnl          Assessment   DOE (dyspnea on exertion) 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  - referred to Allergy 02/17/2021  -  02/17/2021   Walked on RA x  2  lap(s) =  approx 300 @ slow pace, stopped due to sob with lowest 02 sats 100%  - 02/17/2021  After extensive coaching inhaler device,  effectiveness =    75% > try breztri in place of trelegy x 2 week samples and fill  rx if helping   Symptoms are markedly disproportionate to objective findings and not clear to what extent this is actually a pulmonary  problem but pt does appear to have difficult to sort out respiratory symptoms of unknown origin for which  DDX  = almost all start with A and  include Adherence, Ace Inhibitors, Acid Reflux, Active Sinus Disease, Alpha 1 Antitripsin deficiency, Anxiety masquerading as Airways dz,  ABPA,  Allergy(esp in young), Aspiration (esp in elderly), Adverse effects of meds,  Active smoking or Vaping, A bunch of PE's/clot burden (a few small clots can't cause this syndrome unless there is already severe underlying pulm or vascular dz with poor reserve),  Anemia or thyroid disorder, plus two Bs  = Bronchiectasis and Beta blocker use..and one C= CHF     Adherence is always the initial "prime suspect" and is a multilayered concern that requires a "trust but verify" approach in every patient - starting with knowing how to use medications, especially inhalers, correctly, keeping up with refills and understanding the fundamental difference between maintenance and prns vs those medications only taken for a very short course and then stopped and not refilled.  - see hfa teaching - return with all meds in hand using a trust but verify approach to confirm accurate Medication  Reconciliation The principal here is that until we are certain that the  patients are doing what we've asked, it makes no sense to ask them to do more.    ? Adverse effects of meds > try off trelegy and on hfa for now  ? Allergy/ asthma with polyposis > check labs and refer to allergy and double pred until better then back to baseline ? 10 mg / day x years   ? Acute sinus dz/  polyposis ? Candidate for dupixent or other biologic > defer to allergy  ? Alpha one AT def > check phenotype  ? Anxiety > usually at the bottom of this list of usual suspects but  may interfere with adherence and also interpretation of response or lack thereof to symptom management which can be quite subjective.  ? Anemia/ thyroid dz > check labs  ? A bunch of Pes > check d dimer  - comment:  while a normal  or high normal value (seen commonly in the elderly or chronically ill)  may miss small peripheral pe, the clot burden with sob is moderately high and the d dimer  has a very high neg pred value if used in this setting.   ? chf > check  Bnp/ cxr does not support chf   >>> f/u in 6 weeks with pfts on return   Each maintenance medication was reviewed in detail including emphasizing most importantly the difference between maintenance and prns and under what circumstances the prns are to be triggered using an action plan format where appropriate.  Total time for H and P, chart review, counseling, reviewing hfa  device(s) , directly observing portions of ambulatory 02 saturation study/ and generating customized AVS unique to this office visit / same day charting = 65 min                  Emily Gully, MD 02/17/2021

## 2021-02-17 NOTE — Patient Instructions (Addendum)
We will be referring you to allergy here in [place]   Pantoprazole (protonix) 40 mg  Take  30-60 min before first meal of the day and Pepcid (famotidine)  20 mg after supper until return to office - this is the best way to tell whether stomach acid is contributing to your problem.    GERD (REFLUX)  is an extremely common cause of respiratory symptoms just like yours , many times with no obvious heartburn at all.    It can be treated with medication, but also with lifestyle changes including elevation of the head of your bed (ideally with 6-8inch blocks under the headboard of your bed),  Smoking cessation, avoidance of late meals, excessive alcohol, and avoid fatty foods, chocolate, peppermint, colas, red wine, and acidic juices such as orange juice.  NO MINT OR MENTHOL PRODUCTS SO NO COUGH DROPS  USE SUGARLESS CANDY INSTEAD (Jolley ranchers or Stover's or Life Savers) or even ice chips will also do - the key is to swallow to prevent all throat clearing. NO OIL BASED VITAMINS - use powdered substitutes.  Avoid fish oil when coughing.     Plan A = Automatic = Always=     Try breztri Take 2 puffs first thing in am and then another 2 puffs about 12 hours later.   Plan B = Backup (to supplement plan A, not to replace it) Only use your albuterol inhaler as a rescue medication to be used if you can't catch your breath by resting or doing a relaxed purse lip breathing pattern.  - The less you use it, the better it will work when you need it. - Ok to use the inhaler up to 2 puffs  every 4 hours if you must but call for appointment if use goes up over your usual need - Don't leave home without it !!  (think of it like the spare tire for your car)   Plan C = Crisis (instead of Plan B but only if Plan B stops working) - only use your albuterol nebulizer if you first try Plan B and it fails to help > ok to use the nebulizer up to every 4 hours but if start needing it regularly call for immediate  appointment   Also Ok to try albuterol 15 min before an activity (on alternating days)  that you know would usually make you short of breath and see if it makes any difference and if makes none then don't take albuterol after activity unless you can't catch your breath as this means it's the resting that helps, not the albuterol.       Plan D = Deltasone = prednisone >>> double the dose you usually take until better then one daily with breafast  Please remember to go to the  x-ray department  @  [facility] for your tests - we will call you with the results when they are available     Please schedule a follow up office visit in 4-6  weeks, sooner if needed  with pfts on return

## 2021-02-17 NOTE — Assessment & Plan Note (Addendum)
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  - referred to Allergy 02/17/2021  - 02/17/2021   Walked on RA x  2  lap(s) =  approx 300 @ slow pace, stopped due to sob with lowest 02 sats 100%  - 02/17/2021  After extensive coaching inhaler device,  effectiveness =    75% > try breztri in place of trelegy x 2 week samples and fill rx if helping   Symptoms are markedly disproportionate to objective findings and not clear to what extent this is actually a pulmonary  problem but pt does appear to have difficult to sort out respiratory symptoms of unknown origin for which  DDX  = almost all start with A and  include Adherence, Ace Inhibitors, Acid Reflux, Active Sinus Disease, Alpha 1 Antitripsin deficiency, Anxiety masquerading as Airways dz,  ABPA,  Allergy(esp in young), Aspiration (esp in elderly), Adverse effects of meds,  Active smoking or Vaping, A bunch of PE's/clot burden (a few small clots can't cause this syndrome unless there is already severe underlying pulm or vascular dz with poor reserve),  Anemia or thyroid disorder, plus two Bs  = Bronchiectasis and Beta blocker use..and one C= CHF     Adherence is always the initial "prime suspect" and is a multilayered concern that requires a "trust but verify" approach in every patient - starting with knowing how to use medications, especially inhalers, correctly, keeping up with refills and understanding the fundamental difference between maintenance and prns vs those medications only taken for a very short course and then stopped and not refilled.  - see hfa teaching - return with all meds in hand using a trust but verify approach to confirm accurate Medication  Reconciliation The principal here is that until we are certain that the  patients are doing what we've asked, it makes no sense to ask them to do more.    ? Adverse effects of meds > try off trelegy and on hfa for now  ? Allergy/ asthma with polyposis > check labs and  refer to allergy and double pred until better then back to baseline ? 10 mg / day x years   ? Acute sinus dz/ polyposis ? Candidate for dupixent or other biologic > defer to allergy  ? Alpha one AT def > check phenotype  ? Anxiety > usually at the bottom of this list of usual suspects but  may interfere with adherence and also interpretation of response or lack thereof to symptom management which can be quite subjective.  ? Anemia/ thyroid dz > check labs  ? A bunch of Pes > check d dimer  - comment:  while a normal  or high normal value (seen commonly in the elderly or chronically ill)  may miss small peripheral pe, the clot burden with sob is moderately high and the d dimer  has a very high neg pred value if used in this setting.   ? chf > check  Bnp/ cxr does not support chf   >>> f/u in 6 weeks with pfts on return   Each maintenance medication was reviewed in detail including emphasizing most importantly the difference between maintenance and prns and under what circumstances the prns are to be triggered using an action plan format where appropriate.  Total time for H and P, chart review, counseling, reviewing hfa  device(s) , directly observing portions of ambulatory 02 saturation study/ and generating customized AVS unique to this office visit / same day  charting = 65 min

## 2021-02-24 ENCOUNTER — Other Ambulatory Visit: Payer: Self-pay

## 2021-02-24 DIAGNOSIS — R0600 Dyspnea, unspecified: Secondary | ICD-10-CM

## 2021-02-24 DIAGNOSIS — R0609 Other forms of dyspnea: Secondary | ICD-10-CM

## 2021-02-24 LAB — BASIC METABOLIC PANEL
BUN/Creatinine Ratio: 17 (ref 12–28)
BUN: 19 mg/dL (ref 8–27)
CO2: 22 mmol/L (ref 20–29)
Calcium: 10.3 mg/dL (ref 8.7–10.3)
Chloride: 99 mmol/L (ref 96–106)
Creatinine, Ser: 1.15 mg/dL — ABNORMAL HIGH (ref 0.57–1.00)
Glucose: 94 mg/dL (ref 65–99)
Potassium: 4.4 mmol/L (ref 3.5–5.2)
Sodium: 143 mmol/L (ref 134–144)
eGFR: 52 mL/min/{1.73_m2} — ABNORMAL LOW (ref >59)

## 2021-02-24 LAB — ALPHA-1-ANTITRYPSIN PHENOTYP: A-1 Antitrypsin: 142 mg/dL (ref 101–187)

## 2021-02-24 LAB — CBC WITH DIFFERENTIAL/PLATELET
Basophils Absolute: 0.1 10*3/uL (ref 0.0–0.2)
Basos: 1 %
EOS (ABSOLUTE): 0.4 10*3/uL (ref 0.0–0.4)
Eos: 3 %
Hematocrit: 44.6 % (ref 34.0–46.6)
Hemoglobin: 14.6 g/dL (ref 11.1–15.9)
Immature Grans (Abs): 0.1 10*3/uL (ref 0.0–0.1)
Immature Granulocytes: 1 %
Lymphocytes Absolute: 4.3 10*3/uL — ABNORMAL HIGH (ref 0.7–3.1)
Lymphs: 34 %
MCH: 28.4 pg (ref 26.6–33.0)
MCHC: 32.7 g/dL (ref 31.5–35.7)
MCV: 87 fL (ref 79–97)
Monocytes Absolute: 1 10*3/uL — ABNORMAL HIGH (ref 0.1–0.9)
Monocytes: 8 %
Neutrophils Absolute: 6.6 10*3/uL (ref 1.4–7.0)
Neutrophils: 53 %
Platelets: 319 10*3/uL (ref 150–450)
RBC: 5.14 x10E6/uL (ref 3.77–5.28)
RDW: 12.9 % (ref 11.7–15.4)
WBC: 12.3 10*3/uL — ABNORMAL HIGH (ref 3.4–10.8)

## 2021-02-24 LAB — TSH: TSH: 1.54 u[IU]/mL (ref 0.450–4.500)

## 2021-02-24 LAB — D-DIMER, QUANTITATIVE: D-DIMER: 0.25 mg/L FEU (ref 0.00–0.49)

## 2021-02-24 LAB — IGE: IgE (Immunoglobulin E), Serum: 403 IU/mL (ref 6–495)

## 2021-02-24 LAB — BRAIN NATRIURETIC PEPTIDE: BNP: 24 pg/mL (ref 0.0–100.0)

## 2021-03-02 ENCOUNTER — Other Ambulatory Visit: Payer: Self-pay

## 2021-03-02 ENCOUNTER — Telehealth: Payer: Self-pay | Admitting: Internal Medicine

## 2021-03-02 DIAGNOSIS — R0600 Dyspnea, unspecified: Secondary | ICD-10-CM

## 2021-03-02 DIAGNOSIS — R0609 Other forms of dyspnea: Secondary | ICD-10-CM

## 2021-03-02 NOTE — Telephone Encounter (Signed)
Put in order for PFT. Patient is going to call pharmacy to straighten out prescription question and will call us back if we need to do anything for the prescription.

## 2021-03-03 DIAGNOSIS — E1165 Type 2 diabetes mellitus with hyperglycemia: Secondary | ICD-10-CM | POA: Diagnosis not present

## 2021-03-03 DIAGNOSIS — Z23 Encounter for immunization: Secondary | ICD-10-CM | POA: Diagnosis not present

## 2021-03-06 DIAGNOSIS — H0102B Squamous blepharitis left eye, upper and lower eyelids: Secondary | ICD-10-CM | POA: Diagnosis not present

## 2021-03-06 DIAGNOSIS — H16223 Keratoconjunctivitis sicca, not specified as Sjogren's, bilateral: Secondary | ICD-10-CM | POA: Diagnosis not present

## 2021-03-06 DIAGNOSIS — H0102A Squamous blepharitis right eye, upper and lower eyelids: Secondary | ICD-10-CM | POA: Diagnosis not present

## 2021-03-06 DIAGNOSIS — H02834 Dermatochalasis of left upper eyelid: Secondary | ICD-10-CM | POA: Diagnosis not present

## 2021-03-06 DIAGNOSIS — H02203 Unspecified lagophthalmos right eye, unspecified eyelid: Secondary | ICD-10-CM | POA: Diagnosis not present

## 2021-03-06 DIAGNOSIS — H02206 Unspecified lagophthalmos left eye, unspecified eyelid: Secondary | ICD-10-CM | POA: Diagnosis not present

## 2021-03-06 DIAGNOSIS — H26493 Other secondary cataract, bilateral: Secondary | ICD-10-CM | POA: Diagnosis not present

## 2021-03-06 DIAGNOSIS — H11823 Conjunctivochalasis, bilateral: Secondary | ICD-10-CM | POA: Diagnosis not present

## 2021-03-06 DIAGNOSIS — H02831 Dermatochalasis of right upper eyelid: Secondary | ICD-10-CM | POA: Diagnosis not present

## 2021-03-08 ENCOUNTER — Telehealth: Payer: Self-pay | Admitting: Internal Medicine

## 2021-03-09 ENCOUNTER — Other Ambulatory Visit: Payer: Self-pay

## 2021-03-09 MED ORDER — BREZTRI AEROSPHERE 160-9-4.8 MCG/ACT IN AERO: 2.0000 | INHALATION_SPRAY | Freq: Two times a day (BID) | RESPIRATORY_TRACT | 0 refills | Status: DC

## 2021-03-09 NOTE — Telephone Encounter (Signed)
Spoke to patient and advised her to come by the office and pick up some Breztri samples. Patient stated she's unsure if she can afford the inhalers. I did tell the patient about the AZ&ME forms. She said she would like to try submitting the form to try and get assistance before trying to switch inhalers. She will come by the [place] office 9/23 in the am to get the form and samples.  Nothing further needed at this time.

## 2021-03-13 DIAGNOSIS — M8588 Other specified disorders of bone density and structure, other site: Secondary | ICD-10-CM | POA: Diagnosis not present

## 2021-03-13 DIAGNOSIS — M810 Age-related osteoporosis without current pathological fracture: Secondary | ICD-10-CM | POA: Diagnosis not present

## 2021-03-13 DIAGNOSIS — N951 Menopausal and female climacteric states: Secondary | ICD-10-CM | POA: Diagnosis not present

## 2021-03-17 DIAGNOSIS — E1165 Type 2 diabetes mellitus with hyperglycemia: Secondary | ICD-10-CM | POA: Diagnosis not present

## 2021-03-17 DIAGNOSIS — Z794 Long term (current) use of insulin: Secondary | ICD-10-CM | POA: Diagnosis not present

## 2021-03-17 DIAGNOSIS — J4541 Moderate persistent asthma with (acute) exacerbation: Secondary | ICD-10-CM | POA: Diagnosis not present

## 2021-03-17 DIAGNOSIS — Z87891 Personal history of nicotine dependence: Secondary | ICD-10-CM | POA: Diagnosis not present

## 2021-03-17 DIAGNOSIS — I10 Essential (primary) hypertension: Secondary | ICD-10-CM | POA: Diagnosis not present

## 2021-03-24 DIAGNOSIS — Z23 Encounter for immunization: Secondary | ICD-10-CM | POA: Diagnosis not present

## 2021-03-27 ENCOUNTER — Telehealth: Payer: Self-pay | Admitting: Internal Medicine

## 2021-03-27 ENCOUNTER — Ambulatory Visit (HOSPITAL_COMMUNITY): Payer: Medicare HMO

## 2021-03-27 ENCOUNTER — Other Ambulatory Visit: Payer: Self-pay

## 2021-03-27 NOTE — Telephone Encounter (Signed)
Paperwork filled out and placed on Dr. Morrison Old for signing in the am.

## 2021-03-28 NOTE — Telephone Encounter (Signed)
Paperwork signed by MW this morning. Faxed to AZ&ME. Received fax confirmation 10:18 am.

## 2021-04-06 ENCOUNTER — Telehealth: Payer: Self-pay | Admitting: Internal Medicine

## 2021-04-06 NOTE — Telephone Encounter (Signed)
Primary Pulmonologist: DR. Melvyn Novas Last office visit and with whom: 02/17/21 Dr. Melvyn Novas What do we see them for (pulmonary problems): DOE Last OV assessment/plan: See below   Was appointment offered to patient (explain)?  No    Reason for call:   Patient hasn't been feeling well all week. Has cough with white mucus and chest tightness. Has been using breztri 2x a day and says she feels a little better but is still not feeling very well. Very tired and sleeping a lot through the day. Has only taken tylenol. Doesn't have a fever. No wheezing. Has not taken a covid test.    Patient wants to know if she should go get her scheduled CXR tomorrow at [facility] with her symptoms going on and what Dr. Melvyn Novas can rec to help her.   Dr. Melvyn Novas please advise.    Allergies  Allergen Reactions   Atorvastatin     Other reaction(s): Muscle Pain   Penicillins Itching    hives   Sulfa Antibiotics Itching    rash    Immunization History  Administered Date(s) Administered   Influenza,inj,Quad PF,6+ Mos 03/04/2017   Influenza-Unspecified 04/27/2015, 03/27/2016   PFIZER(Purple Top)SARS-COV-2 Vaccination 08/13/2019, 09/03/2019, 05/03/2020   Pneumococcal Polysaccharide-23 05/13/2000, 05/07/2010, 04/23/2016   Tdap 06/07/2011

## 2021-04-06 NOTE — Telephone Encounter (Signed)
Spoke to patient. Patient voiced understanding. She will keep CXR and NM pulmonary perf scan. Nothing further needed at this time.

## 2021-04-06 NOTE — Telephone Encounter (Signed)
I forgot about that - really she needs to keep these radiology  appts as we need to figure out what's wrong but ok to skip the pfts

## 2021-04-06 NOTE — Telephone Encounter (Signed)
Sound like needs to be seen but no need to do cxr on day she's not going to be seen  - ok to put off until day of ov but if getting worse in meantime and can't get her in then should go to ER

## 2021-04-06 NOTE — Telephone Encounter (Signed)
Spoke to patient and relayed below message. Patient is scheduled for OV on 04/21/2021. She stated that she will CXR that day.  She is also scheduled for PFT. She would like to decline PFT at this time, as she does not feel that she could tolerate PFT. Upon research it appears that patient is scheduled for NM pulmonary perf scan tomorrow. She is questioning if she should cancel this? CXR is needed prior to this test  Dr. Melvyn Novas, please advise. Thanks

## 2021-04-07 ENCOUNTER — Ambulatory Visit (HOSPITAL_COMMUNITY)
Admission: RE | Admit: 2021-04-07 | Discharge: 2021-04-07 | Disposition: A | Discharge status: Home / Self Care | Payer: Medicare HMO | Source: Ambulatory Visit | Attending: Internal Medicine | Admitting: Internal Medicine

## 2021-04-07 ENCOUNTER — Other Ambulatory Visit: Payer: Self-pay

## 2021-04-07 ENCOUNTER — Encounter (HOSPITAL_COMMUNITY)
Admission: RE | Admit: 2021-04-07 | Discharge: 2021-04-07 | Disposition: A | Discharge status: Home / Self Care | Payer: Medicare HMO | Source: Ambulatory Visit | Attending: Internal Medicine | Admitting: Internal Medicine

## 2021-04-07 DIAGNOSIS — R0609 Other forms of dyspnea: Secondary | ICD-10-CM | POA: Diagnosis not present

## 2021-04-07 DIAGNOSIS — R918 Other nonspecific abnormal finding of lung field: Secondary | ICD-10-CM | POA: Diagnosis not present

## 2021-04-07 DIAGNOSIS — R0602 Shortness of breath: Secondary | ICD-10-CM | POA: Diagnosis not present

## 2021-04-07 MED ORDER — TECHNETIUM TO 99M ALBUMIN AGGREGATED
4.0000 | Freq: Once | INTRAVENOUS | Status: AC | PRN
  Administered 2021-04-07: 4.2 via INTRAVENOUS

## 2021-04-14 ENCOUNTER — Ambulatory Visit (INDEPENDENT_AMBULATORY_CARE_PROVIDER_SITE_OTHER): Payer: Medicare HMO | Admitting: Allergy & Immunology

## 2021-04-14 ENCOUNTER — Other Ambulatory Visit: Payer: Self-pay

## 2021-04-14 ENCOUNTER — Encounter: Payer: Self-pay | Admitting: Allergy & Immunology

## 2021-04-14 VITALS — BP 170/84 | HR 98 | Temp 97.7°F | Resp 22 | Ht 62.0 in | Wt 168.6 lb

## 2021-04-14 DIAGNOSIS — J449 Chronic obstructive pulmonary disease, unspecified: Secondary | ICD-10-CM | POA: Diagnosis not present

## 2021-04-14 DIAGNOSIS — J310 Chronic rhinitis: Secondary | ICD-10-CM

## 2021-04-14 MED ORDER — IPRATROPIUM BROMIDE 0.03 % NA SOLN: 2.0000 | Freq: Three times a day (TID) | NASAL | 3 refills | Status: DC

## 2021-04-14 NOTE — Progress Notes (Signed)
NEW PATIENT  Date of Service/Encounter:  04/14/21  Consult requested by: Lanelle Bal, PA-C   Assessment:   Asthma-COPD overlap syndrome - with eosinophilic phenotype (strongly consider Dupixent due to coexisting nasal polyps)  Chronic rhinitis - with non reactive histamine  Nasal polyposis  Chronic prednisone use  Plan/Recommendations:    1. Asthma-COPD overlap syndrome - Lung testing looked fairly good. - Continue with the Breztri per Dr. Melvyn Novas. - We are not going to make any changes at this time.  - We are going to look into starting [place] for management of asthma, which would also help with nasal polyposis.  2. Chronic rhinitis - Testing was negative to the entire panel. - Histamine was non- reactive as well. - We are going to get blood work instead.  - Continue with the loratadine 10mg  daily. - Add on Atrovent (ipratropium) one spray per nostril daily (can use up to 3 times daily if needed, but this can be overdrying).  2. Return in about 8 weeks (around 06/09/2021).    This note in its entirety was forwarded to the Provider who requested this consultation.  Subjective:   Emily Humphrey is a 70 y.o. female presenting today for evaluation of  Chief Complaint  Patient presents with   Asthma    Emily Humphrey has a history of the following: Patient Active Problem List   Diagnosis Date Noted   DOE (dyspnea on exertion) 02/17/2021   Cerebral vascular disease 09/06/2020   Mild cognitive impairment 09/06/2020   Abnormal finding on MRI of brain 05/10/2020   Gait abnormality 05/10/2020   Essential hypertension, benign 08/01/2015   Type 2 diabetes mellitus, uncontrolled ([place])    Hyperlipidemia    COPD (chronic obstructive pulmonary disease) ([place])    History of tobacco abuse    Asthma    GERD (gastroesophageal reflux disease)    Morbid obesity ([place])    Chest pain    Pericardial effusion    Ejection fraction    Pericarditis     History obtained from:  chart review and patient.  Daris Aristizabal was referred by Lanelle Bal, PA-C.     Aury is a 70 y.o. female presenting for an evaluation of environmental allergies.    Asthma/Respiratory Symptom History: She has a diagnosis of asthma with COPD. She follows with Dr. Melvyn Novas. She has an appointment next week. She is taking Breztri and was approved for [place] and [place]. Breztri seems to be working very well, much better than Trelegy.   Dr. Melvyn Novas sent labs in June 2022.  IgE was 403.  Absolute eosinophil count was 400.  Allergic Rhinitis Symptom History: She does report that she has chronic rhinorrhea. She also reports a history of dry eyes. She has used many high priced medications to treat this including a variety of drops. She is using loratadine 10mg  once daily. She has not noticed that it makes her eyes worse. She does not use a nose spray. She does clear her throat a lot through the year. This is constant.   Otherwise, there is no history of other atopic diseases, including food allergies, drug allergies, stinging insect allergies, eczema, urticaria, or contact dermatitis. There is no significant infectious history. Vaccinations are up to date.    Past Medical History: Patient Active Problem List   Diagnosis Date Noted   DOE (dyspnea on exertion) 02/17/2021   Cerebral vascular disease 09/06/2020   Mild cognitive impairment 09/06/2020   Abnormal finding on MRI of brain 05/10/2020   Gait  abnormality 05/10/2020   Essential hypertension, benign 08/01/2015   Type 2 diabetes mellitus, uncontrolled ([place])    Hyperlipidemia    COPD (chronic obstructive pulmonary disease) ([facility])    History of tobacco abuse    Asthma    GERD (gastroesophageal reflux disease)    Morbid obesity ([facility])    Chest pain    Pericardial effusion    Ejection fraction    Pericarditis     Medication List:  Allergies as of 04/14/2021       Reactions   Atorvastatin    Other reaction(s): Muscle Pain   Penicillins Itching    hives   Sulfa Antibiotics Itching   rash        Medication List        Accurate as of April 14, 2021 11:59 PM. If you have any questions, ask your nurse or doctor.          STOP taking these medications    Trelegy Ellipta 200-62.5-25 MCG/ACT Aepb Generic drug: Fluticasone-Umeclidin-Vilant Stopped by: Valentina Shaggy, MD       TAKE these medications    albuterol (2.5 MG/3ML) 0.083% nebulizer solution Commonly known as: PROVENTIL Take 2.5 mg by nebulization every 4 (four) hours as needed for wheezing or shortness of breath.   albuterol 108 (90 Base) MCG/ACT inhaler Commonly known as: VENTOLIN HFA Inhale 2 puffs into the lungs every 4 (four) hours as needed.   Breztri Aerosphere 160-9-4.8 MCG/ACT Aero Generic drug: Budeson-Glycopyrrol-Formoterol Inhale 2 puffs into the lungs 2 (two) times daily.   Breztri Aerosphere 160-9-4.8 MCG/ACT Aero Generic drug: Budeson-Glycopyrrol-Formoterol Inhale 2 puffs into the lungs in the morning and at bedtime.   CALCIUM PO Take 1 tablet by mouth daily.   diazepam 10 MG tablet Commonly known as: VALIUM Take 1 tablet by mouth at bedtime.   dicyclomine 20 MG tablet Commonly known as: BENTYL Take 20 mg by mouth 3 (three) times daily as needed for spasms.   donepezil 10 MG tablet Commonly known as: ARICEPT Take 1 tablet (10 mg total) by mouth at bedtime.   DULoxetine 60 MG capsule Commonly known as: CYMBALTA   famotidine 20 MG tablet Commonly known as: PEPCID   gabapentin 600 MG tablet Commonly known as: NEURONTIN Take 600 mg by mouth 3 (three) times daily.   insulin NPH-regular Human (70-30) 100 UNIT/ML injection Inject 50 Units into the skin daily with breakfast. Plus 20 u in evening   ipratropium 0.03 % nasal spray Commonly known as: ATROVENT Place 2 sprays into both nostrils 3 (three) times daily. Started by: Valentina Shaggy, MD   LORazepam 0.5 MG tablet Commonly known as: ATIVAN Take 1 tablet by  mouth as needed.   metFORMIN 500 MG (MOD) 24 hr tablet Commonly known as: GLUMETZA Take 1,000 mg by mouth daily.   pantoprazole 40 MG tablet Commonly known as: PROTONIX Take 1 tablet by mouth Daily.   predniSONE 10 MG tablet Commonly known as: DELTASONE Take 10 mg by mouth daily.   simvastatin 20 MG tablet Commonly known as: ZOCOR Take 20 mg by mouth daily.   traMADol 50 MG tablet Commonly known as: ULTRAM Take 50 mg by mouth 3 (three) times daily as needed.        Birth History: non-contributory  Developmental History: non-contributory  Past Surgical History: Past Surgical History:  Procedure Laterality Date   ABDOMINAL HYSTERECTOMY     NASAL SINUS SURGERY     Removal of throat nodules     vocal  cored nodules     Family History: Family History  Problem Relation Age of Onset   Heart attack Mother        deceased at age 69   Asthma Mother    Other Father        deceased at age 78   Diabetes Sister      Social History: Elane lives at home with her family. She lives in a town home that is around [address] years old. There is wood flooring the main living area and carpeting in the bedroom. They have electric heating and central cooling. There are no dust mite coverings in the bedding. There is no tobacco exposure in the home. She is a retired Physicist, medical. She is loving the retired life.   Review of Systems  Constitutional: Negative.  Negative for fever, malaise/fatigue and weight loss.  HENT:  Positive for congestion. Negative for ear discharge and ear pain.        Positive for postnasal drip.  Eyes:  Negative for pain, discharge and redness.  Respiratory:  Positive for cough. Negative for sputum production, shortness of breath and wheezing.   Cardiovascular: Negative.  Negative for chest pain and palpitations.  Gastrointestinal:  Negative for abdominal pain, heartburn, nausea and vomiting.  Skin: Negative.  Negative for itching and rash.   Neurological:  Negative for dizziness and headaches.  Endo/Heme/Allergies:  Negative for environmental allergies. Does not bruise/bleed easily.      Objective:   Blood pressure (!) 170/84, pulse 98, temperature 97.7 F (36.5 C), resp. rate (!) 22, height 5\' 2"  (1.575 m), weight 168 lb 9.6 oz (76.5 kg), SpO2 97 %. Body mass index is 30.84 kg/m.    Physical Exam Vitals reviewed.  Constitutional:      Appearance: She is well-developed. She is obese.  HENT:     Head: Normocephalic and atraumatic.     Right Ear: Tympanic membrane, ear canal and external ear normal. No drainage, swelling or tenderness. Tympanic membrane is not injected, scarred, erythematous, retracted or bulging.     Left Ear: Tympanic membrane, ear canal and external ear normal. No drainage, swelling or tenderness. Tympanic membrane is not injected, scarred, erythematous, retracted or bulging.     Nose: No nasal deformity, septal deviation, mucosal edema or rhinorrhea.     Right Turbinates: Enlarged, swollen and pale.     Left Turbinates: Enlarged, swollen and pale.     Right Sinus: No maxillary sinus tenderness or frontal sinus tenderness.     Left Sinus: No maxillary sinus tenderness or frontal sinus tenderness.     Comments: Bilateral nasal polyposis with approximately 75% obstruction bilaterally.    Mouth/Throat:     Mouth: Mucous membranes are not pale and not dry.     Pharynx: Uvula midline.     Comments: Cobblestoning in the posterior oropharynx. Eyes:     General:        Right eye: No discharge.        Left eye: No discharge.     Conjunctiva/sclera: Conjunctivae normal.     Right eye: Right conjunctiva is not injected. No chemosis.    Left eye: Left conjunctiva is not injected. No chemosis.    Pupils: Pupils are equal, round, and reactive to light.  Cardiovascular:     Rate and Rhythm: Normal rate and regular rhythm.     Heart sounds: Normal heart sounds.  Pulmonary:     Effort: Pulmonary effort is  normal. No tachypnea, accessory muscle  usage or respiratory distress.     Breath sounds: Normal breath sounds. No wheezing, rhonchi or rales.     Comments: Moving air well in all lung fields.  No increased work of breathing. Chest:     Chest wall: No tenderness.  Abdominal:     Tenderness: There is no abdominal tenderness. There is no guarding or rebound.  Lymphadenopathy:     Head:     Right side of head: No submandibular, tonsillar or occipital adenopathy.     Left side of head: No submandibular, tonsillar or occipital adenopathy.     Cervical: No cervical adenopathy.  Skin:    General: Skin is warm.     Capillary Refill: Capillary refill takes less than 2 seconds.     Coloration: Skin is not pale.     Findings: No abrasion, erythema, petechiae or rash. Rash is not papular, urticarial or vesicular.     Comments: No eczematous or urticarial lesions noted.  Neurological:     Mental Status: She is alert.  Psychiatric:        Behavior: Behavior is cooperative.     Diagnostic studies:   Spirometry: results normal (FEV1: 1.54/74%, FVC: 2.10/78%, FEV1/FVC: 73%).    Spirometry consistent with normal pattern.   Allergy Studies: Negative to the entire panel, but the histamine was nonreactive as well.  I also put a strong histamine on and this was nonreactive.  Routine  1. Control-Buffer 50% Glycerol Negative      2. Control-Histamine 1 mg/ml Negative  STRONG HISTAMINE: NON REACTIVE      3. Albumin saline Negative      Grasses  4. Le Grand Negative      5. [place] Negative      6. Johnson Negative      7. Baldwin Blue Negative      8. Meadow Fescue Negative      9. Perennial Rye Negative      10. Sweet Vernal Negative      11. Timothy Negative      Weeds  12. Cocklebur Negative      13. Burweed Marshelder Negative      14. Ragweed, short Negative      15. Ragweed, Giant Negative      16. Plantain,  English Negative      17. Lamb's Quarters Negative      18. Sheep Sorrell  Negative      19. Rough Pigweed Negative      20. Marsh Elder, Rough Negative      21. Mugwort, Common Negative      Trees  22. Ash mix Negative      23. Birch mix Negative      24. Beech American Negative      25. Box, Elder Negative      26. Cedar, red Negative      27. Cottonwood, [place] Negative      28. Elm mix Negative      29. Hickory Negative      30. Maple mix Negative      31. Oak, [place] mix Negative      32. Pecan Pollen Negative      33. Pine mix Negative      34. Sycamore Eastern Negative      35. Walnut, Black Pollen Negative      Major Molds Mix (seasonal) 1  36. Alternaria alternata Negative      37. Cladosporium Herbarum Negative      Major Molds Mix (  perennial ) 2  38. Aspergillus mix Negative      39. Penicillium mix Negative      Minor Mold Mix (seasonal) 3  40. Bipolaris sorokiniana (Helminthosporium) Negative      41. Drechslera spicifera (Curvularia) Negative      42. Mucor plumbeus Negative      Minor Molds Mix (perennial ) 4  43. Fusarium moniliforme Negative      44. Aureobasidium pullulans (pullulara) Negative      45. Rhizopus oryzae Negative      Other Molds  46. Botrytis cinera Negative      47. Epicoccum nigrum Negative      48. Phoma betae Negative      49. Candida Albicans Negative      50. Trichophyton mentagrophytes Negative      Inhalants  51. Mite, D Farinae  5,000 AU/ml Negative      52. Mite, D Pteronyssinus  5,000 AU/ml Negative      53. Cat Hair 10,000 BAU/ml Negative      54.  Dog Epithelia Negative      55. Mixed Feathers Negative      56. Horse Epithelia Negative      57. Cockroach, German Negative      58. Mouse Negative      59. Tobacco Leaf Negative              Salvatore Marvel, MD [facility]

## 2021-04-14 NOTE — Patient Instructions (Addendum)
1. Asthma-COPD overlap syndrome ([place]) - Lung testing looked fairly good. - Continue with the Breztri per Dr. Melvyn Novas. - We are not going to make any changes at this time.   2. Chronic rhinitis - Testing was negative to the entire panel. - Histamine was non- reactive as well. - We are going to get blood work instead.  - Continue with the loratadine 10mg  daily. - Add on Atrovent (ipratropium) one spray per nostril daily (can use up to 3 times daily if needed, but this can be overdrying).  2. Return in about 8 weeks (around 06/09/2021).    Please inform us of any Emergency Department visits, hospitalizations, or changes in symptoms. Call us before going to the ED for breathing or allergy symptoms since we might be able to fit you in for a sick visit. Feel free to contact us anytime with any questions, problems, or concerns.  It was a pleasure to meet you today!  Websites that have reliable patient information: 1. American Academy of Asthma, Allergy, and Immunology: www.aaaai.org 2. Food Allergy Research and Education (FARE): foodallergy.org 3. Mothers of Asthmatics: http://www.asthmacommunitynetwork.org 4. American College of Allergy, Asthma, and Immunology: www.acaai.org   COVID-19 Vaccine Information can be found at: ShippingScam.co.uk For questions related to vaccine distribution or appointments, please email [email or web address] or call [phone].   We realize that you might be concerned about having an allergic reaction to the COVID19 vaccines. To help with that concern, WE ARE OFFERING THE COVID19 VACCINES IN OUR OFFICE! Ask the front desk for dates!     "Like" [place] on Facebook and Instagram for our latest updates!      A healthy democracy works best when [employer] participate! Make sure you are registered to vote! If you have moved or changed any of your contact information, you will need to get this updated before  voting!  In some cases, you MAY be able to register to vote online: [email or web address]    EARLY VOTING HAS STARTED! If you still need to register to vote, you can do this and cast a ballot at any of the early voting locations!

## 2021-04-18 ENCOUNTER — Telehealth: Payer: Self-pay | Admitting: Internal Medicine

## 2021-04-18 ENCOUNTER — Encounter: Payer: Self-pay | Admitting: Allergy & Immunology

## 2021-04-18 ENCOUNTER — Telehealth: Payer: Self-pay | Admitting: *Deleted

## 2021-04-18 NOTE — Telephone Encounter (Signed)
-----   Message from Valentina Shaggy, MD sent at 04/18/2021  8:04 AM EDT ----- Hi there!  I was thinking of starting [place] with her.  I vaguely brought up additional medications to help her asthma, but I had not reviewed her labs at that point.  She does have an absolute eosinophil count of 400 as recently as June.  I tried to call her today to discuss [place], but her mailbox was full.  Just putting this on your radar.  Maybe you can reach out to her today sometime.

## 2021-04-18 NOTE — Telephone Encounter (Signed)
Tried to reach out to patient to discuss PAP for Dupixent but mailbox full

## 2021-04-19 ENCOUNTER — Telehealth: Payer: Self-pay | Admitting: Internal Medicine

## 2021-04-19 NOTE — Telephone Encounter (Signed)
Patient was asking if she could get bloodwork done after the appt on Friday with Dr. Melvyn Novas since it was ordered through [facility] from allergy specialist. Advised patient that would be okay and to call if she has any more questions.   Nothing further needed at this time.

## 2021-04-19 NOTE — Telephone Encounter (Signed)
error 

## 2021-04-21 ENCOUNTER — Ambulatory Visit: Payer: Medicare HMO | Admitting: Internal Medicine

## 2021-04-21 NOTE — Progress Notes (Deleted)
Emily Humphrey, female    DOB: Nov 05, 1950, 70 y.o.   MRN: [id number]   Brief patient profile:  47  yowf   quit smoking 1996   self-referred to pulmonary clinic in [place]  02/17/2021 for copd with symptoms starting 2 m p quit smoking with initial dx asthma/ sinus dz at wt = 140 and daily symptoms on prednisone ever since.   Sinus surgery = dx polyps / recurrent    History of Present Illness  02/17/2021  Pulmonary/ 1st office eval/ [facility] Dershem / [place] [facility]  Chief Complaint  Patient presents with   Consult    Patient has Asthma and COPD. Shortness of breath with exertion and chest tightness. Dry cough  Dyspnea:  mb and back to house wears her out even on pred 40 Cough: dry cough  Sleep: on side bed is horizontal / 2 pillows  SABA use: avg saba hfa sev times plus neb bid  Had hb resolved on ppi bid  Rec We will be referring you to allergy here in [place]   Pantoprazole (protonix) 40 mg  Take  30-60 min before first meal of the day and Pepcid (famotidine)  20 mg after supper until return to office  GERD  rx Plan A = Automatic = Always=     Try breztri Take 2 puffs first thing in am and then another 2 puffs about 12 hours later.  Plan B = Backup (to supplement plan A, not to replace it) Only use your albuterol inhaler as a rescue medication  Plan C = Crisis (instead of Plan B but only if Plan B stops working) - only use your albuterol nebulizer if you first try [place] to try albuterol 15 min before an activity (on alternating days)  that you know would usually make you short of breath Plan D = Deltasone = prednisone >>> double the dose you usually take until better then one daily with breafast   04/21/2021  f/u ov/[place] office/Iam [place] re: *** maint on ***  No chief complaint on file.   Dyspnea:  *** Cough: *** Sleeping: *** SABA use: *** 02: *** Covid status: *** Lung cancer screening: ***   No obvious day to day or daytime variability or assoc excess/ purulent  sputum or mucus plugs or hemoptysis or cp or chest tightness, subjective wheeze or overt sinus or hb symptoms.   *** without nocturnal  or early am exacerbation  of respiratory  c/o's or need for noct saba. Also denies any obvious fluctuation of symptoms with weather or environmental changes or other aggravating or alleviating factors except as outlined above   No unusual exposure hx or h/o childhood pna/ asthma or knowledge of premature birth.  Current Allergies, Complete Past Medical History, Past Surgical History, Family History, and Social History were reviewed in [employer] record.  ROS  The following are not active complaints unless bolded Hoarseness, sore throat, dysphagia, dental problems, itching, sneezing,  nasal congestion or discharge of excess mucus or purulent secretions, ear ache,   fever, chills, sweats, unintended wt loss or wt gain, classically pleuritic or exertional cp,  orthopnea pnd or arm/hand swelling  or leg swelling, presyncope, palpitations, abdominal pain, anorexia, nausea, vomiting, diarrhea  or change in bowel habits or change in bladder habits, change in stools or change in urine, dysuria, hematuria,  rash, arthralgias, visual complaints, headache, numbness, weakness or ataxia or problems with walking or coordination,  change in mood or  memory.  No outpatient medications have been marked as taking for the 04/21/21 encounter (Appointment) with Tanda Rockers, MD.              Past Medical History:  Diagnosis Date   Abnormal gait    Asthma    Chest pain    Hospital, May, 2012,  Pericarditis   COPD (chronic obstructive pulmonary disease) ([place])    Chronic steroid use   Dyslipidemia    Ejection fraction    Normal, echo, [id number]   GERD (gastroesophageal reflux disease)    History of tobacco abuse    IDDM (insulin dependent diabetes mellitus)    Morbid obesity ([place])    OSA (obstructive sleep apnea)    mild/not using C-PAP     Pericardial effusion    Small, echo, circumferential, May, 2012   Pericarditis    Hospitalization, May, 2012   Pneumonia 2010   Tremor         Objective:       Wt Readings from Last 3 Encounters:  04/14/21 168 lb 9.6 oz (76.5 kg)  02/17/21 165 lb (74.8 kg)  09/06/20 175 lb (79.4 kg)      Vital signs reviewed  04/21/2021  - Note at rest 02 sats  ***% on ***   General appearance:    ***     Min bar ***          Assessment

## 2021-04-24 NOTE — Telephone Encounter (Signed)
Tried to reach patient but mailbox full

## 2021-04-25 ENCOUNTER — Telehealth: Payer: Self-pay | Admitting: Internal Medicine

## 2021-04-25 NOTE — Telephone Encounter (Signed)
Notify  pt:  Reviewed cxr and no acute change so no change in recommendations made at ov  I have called the pt and she is aware of results.  She stated that once she is back home from out of town she will call to set up appt.

## 2021-06-02 ENCOUNTER — Ambulatory Visit: Payer: Medicare HMO | Admitting: Family

## 2021-06-12 DIAGNOSIS — Z1329 Encounter for screening for other suspected endocrine disorder: Secondary | ICD-10-CM | POA: Diagnosis not present

## 2021-06-12 DIAGNOSIS — R911 Solitary pulmonary nodule: Secondary | ICD-10-CM | POA: Diagnosis not present

## 2021-06-12 DIAGNOSIS — E1140 Type 2 diabetes mellitus with diabetic neuropathy, unspecified: Secondary | ICD-10-CM | POA: Diagnosis not present

## 2021-06-12 DIAGNOSIS — E1165 Type 2 diabetes mellitus with hyperglycemia: Secondary | ICD-10-CM | POA: Diagnosis not present

## 2021-06-12 DIAGNOSIS — E782 Mixed hyperlipidemia: Secondary | ICD-10-CM | POA: Diagnosis not present

## 2021-06-12 DIAGNOSIS — E7849 Other hyperlipidemia: Secondary | ICD-10-CM | POA: Diagnosis not present

## 2021-06-12 DIAGNOSIS — I10 Essential (primary) hypertension: Secondary | ICD-10-CM | POA: Diagnosis not present

## 2021-06-23 DIAGNOSIS — Z6828 Body mass index (BMI) 28.0-28.9, adult: Secondary | ICD-10-CM | POA: Diagnosis not present

## 2021-06-23 DIAGNOSIS — F419 Anxiety disorder, unspecified: Secondary | ICD-10-CM | POA: Diagnosis not present

## 2021-06-23 DIAGNOSIS — D72829 Elevated white blood cell count, unspecified: Secondary | ICD-10-CM | POA: Diagnosis not present

## 2021-06-23 DIAGNOSIS — Z0001 Encounter for general adult medical examination with abnormal findings: Secondary | ICD-10-CM | POA: Diagnosis not present

## 2021-06-23 DIAGNOSIS — G629 Polyneuropathy, unspecified: Secondary | ICD-10-CM | POA: Diagnosis not present

## 2021-06-23 DIAGNOSIS — E7849 Other hyperlipidemia: Secondary | ICD-10-CM | POA: Diagnosis not present

## 2021-06-23 DIAGNOSIS — E1165 Type 2 diabetes mellitus with hyperglycemia: Secondary | ICD-10-CM | POA: Diagnosis not present

## 2021-06-23 DIAGNOSIS — I10 Essential (primary) hypertension: Secondary | ICD-10-CM | POA: Diagnosis not present

## 2021-07-06 DIAGNOSIS — J45909 Unspecified asthma, uncomplicated: Secondary | ICD-10-CM | POA: Diagnosis not present

## 2021-07-06 DIAGNOSIS — E7800 Pure hypercholesterolemia, unspecified: Secondary | ICD-10-CM | POA: Diagnosis not present

## 2021-07-06 DIAGNOSIS — R809 Proteinuria, unspecified: Secondary | ICD-10-CM | POA: Diagnosis not present

## 2021-07-06 DIAGNOSIS — G629 Polyneuropathy, unspecified: Secondary | ICD-10-CM | POA: Diagnosis not present

## 2021-07-06 DIAGNOSIS — I10 Essential (primary) hypertension: Secondary | ICD-10-CM | POA: Diagnosis not present

## 2021-07-06 DIAGNOSIS — Z7952 Long term (current) use of systemic steroids: Secondary | ICD-10-CM | POA: Diagnosis not present

## 2021-07-06 DIAGNOSIS — E1165 Type 2 diabetes mellitus with hyperglycemia: Secondary | ICD-10-CM | POA: Diagnosis not present

## 2021-07-07 ENCOUNTER — Telehealth: Payer: Self-pay | Admitting: Internal Medicine

## 2021-07-07 ENCOUNTER — Ambulatory Visit: Payer: Medicare HMO | Admitting: Adult Health

## 2021-07-07 ENCOUNTER — Other Ambulatory Visit: Payer: Self-pay

## 2021-07-07 MED ORDER — PREDNISONE 10 MG PO TABS: 10.0000 mg | ORAL_TABLET | Freq: Every day | ORAL | 0 refills | Status: DC

## 2021-07-07 NOTE — Telephone Encounter (Signed)
Double prednisone dose until better then back to prior dose - be sure has f/u asap and using albuterol as needed up to every 4 hours in meantime

## 2021-07-07 NOTE — Telephone Encounter (Signed)
Patient voiced understanding of recommendations. She would like to know if we can send in more prednisone for her as she is running low.   Dr. Melvyn Novas please advise if this is okay

## 2021-07-07 NOTE — Telephone Encounter (Signed)
Called and notified patient. Prednisone sent to [facility]. Nothing further needed.

## 2021-07-07 NOTE — Telephone Encounter (Signed)
Primary Pulmonologist: Dr. Melvyn Novas Last office visit and with whom: 02/17/2021 What do we see them for (pulmonary problems): DOE  Last OV assessment/plan: SEE BELOW   Was appointment offered to patient (explain)?  Patient had an appt in [place] this afternoon but states she was not told appt was in [place] office came to [facility] office instead but was not able to be seen and didn't have enough time to go to [place].   Has an appt with MW on 08/07/2021, first available    Reason for call: Patient is have an increase in SOB, states she is having to use her rescue inhaler more frequently. States she is coughing up white mucus. No fevers or wheezing.  Has not taken a covid or flu test.  States the SOB is bothering her the most.   Allergies  Allergen Reactions   Atorvastatin     Other reaction(s): Muscle Pain   Penicillins Itching    hives   Sulfa Antibiotics Itching    rash    Immunization History  Administered Date(s) Administered   Influenza,inj,Quad PF,6+ Mos 03/04/2017   Influenza-Unspecified 04/27/2015, 03/27/2016   PFIZER(Purple Top)SARS-COV-2 Vaccination 08/13/2019, 09/03/2019, 05/03/2020   Pneumococcal Polysaccharide-23 05/13/2000, 05/07/2010, 04/23/2016   Tdap 06/07/2011     Assessment & Plan Note by Tanda Rockers, MD at 02/17/2021 1:33 PM  Author: Tanda Rockers, MD Author Type: Physician Filed: 02/17/2021  3:53 PM  Note Status: [place] List: Cosign Not Required Encounter Date: 02/17/2021  Problem: DOE (dyspnea on exertion)  Editor: Tanda Rockers, MD (Physician)      Prior Versions: 1. Tanda Rockers, MD (Physician) at 02/17/2021  3:53 PM - Alison Stalling   2. Tanda Rockers, MD (Physician) at 02/17/2021  1:40 PM - Edited   3. Tanda Rockers, MD (Physician) at 02/17/2021  1:37 PM - Written  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  - referred to Allergy 02/17/2021  - 02/17/2021   Walked on RA x  2  lap(s) =  approx 300 @ slow pace, stopped due to sob with lowest 02 sats  100%  - 02/17/2021  After extensive coaching inhaler device,  effectiveness =    75% > try breztri in place of trelegy x 2 week samples and fill rx if helping    Symptoms are markedly disproportionate to objective findings and not clear to what extent this is actually a pulmonary  problem but pt does appear to have difficult to sort out respiratory symptoms of unknown origin for which  DDX  = almost all start with A and  include Adherence, Ace Inhibitors, Acid Reflux, Active Sinus Disease, Alpha 1 Antitripsin deficiency, Anxiety masquerading as Airways dz,  ABPA,  Allergy(esp in young), Aspiration (esp in elderly), Adverse effects of meds,  Active smoking or Vaping, A bunch of PE's/clot burden (a few small clots can't cause this syndrome unless there is already severe underlying pulm or vascular dz with poor reserve),  Anemia or thyroid disorder, plus two Bs  = Bronchiectasis and Beta blocker use..and one C= CHF      Adherence is always the initial "prime suspect" and is a multilayered concern that requires a "trust but verify" approach in every patient - starting with knowing how to use medications, especially inhalers, correctly, keeping up with refills and understanding the fundamental difference between maintenance and prns vs those medications only taken for a very short course and then stopped and not refilled.  - see hfa teaching -  return with all meds in hand using a trust but verify approach to confirm accurate Medication  Reconciliation The principal here is that until we are certain that the  patients are doing what we've asked, it makes no sense to ask them to do more.      ? Adverse effects of meds > try off trelegy and on hfa for now   ? Allergy/ asthma with polyposis > check labs and refer to allergy and double pred until better then back to baseline ? 10 mg / day x years    ? Acute sinus dz/ polyposis ? Candidate for dupixent or other biologic > defer to allergy   ? Alpha one AT def >  check phenotype   ? Anxiety > usually at the bottom of this list of usual suspects but  may interfere with adherence and also interpretation of response or lack thereof to symptom management which can be quite subjective.   ? Anemia/ thyroid dz > check labs   ? A bunch of Pes > check d dimer  - comment:  while a normal  or high normal value (seen commonly in the elderly or chronically ill)  may miss small peripheral pe, the clot burden with sob is moderately high and the d dimer  has a very high neg pred value if used in this setting.    ? chf > check  Bnp/ cxr does not support chf    >>> f/u in 6 weeks with pfts on return     Each maintenance medication was reviewed in detail including emphasizing most importantly the difference between maintenance and prns and under what circumstances the prns are to be triggered using an action plan format where appropriate.   Total time for H and P, chart review, counseling, reviewing hfa  device(s) , directly observing portions of ambulatory 02 saturation study/ and generating customized AVS unique to this office visit / same day charting = 65 min

## 2021-07-08 DIAGNOSIS — R1030 Lower abdominal pain, unspecified: Secondary | ICD-10-CM | POA: Diagnosis not present

## 2021-07-08 DIAGNOSIS — Z6827 Body mass index (BMI) 27.0-27.9, adult: Secondary | ICD-10-CM | POA: Diagnosis not present

## 2021-07-08 DIAGNOSIS — R300 Dysuria: Secondary | ICD-10-CM | POA: Diagnosis not present

## 2021-07-10 DIAGNOSIS — G629 Polyneuropathy, unspecified: Secondary | ICD-10-CM | POA: Diagnosis not present

## 2021-07-31 DIAGNOSIS — H02206 Unspecified lagophthalmos left eye, unspecified eyelid: Secondary | ICD-10-CM | POA: Diagnosis not present

## 2021-07-31 DIAGNOSIS — H02831 Dermatochalasis of right upper eyelid: Secondary | ICD-10-CM | POA: Diagnosis not present

## 2021-07-31 DIAGNOSIS — L2089 Other atopic dermatitis: Secondary | ICD-10-CM | POA: Diagnosis not present

## 2021-07-31 DIAGNOSIS — H02203 Unspecified lagophthalmos right eye, unspecified eyelid: Secondary | ICD-10-CM | POA: Diagnosis not present

## 2021-07-31 DIAGNOSIS — H16223 Keratoconjunctivitis sicca, not specified as Sjogren's, bilateral: Secondary | ICD-10-CM | POA: Diagnosis not present

## 2021-07-31 DIAGNOSIS — H0102B Squamous blepharitis left eye, upper and lower eyelids: Secondary | ICD-10-CM | POA: Diagnosis not present

## 2021-07-31 DIAGNOSIS — H11823 Conjunctivochalasis, bilateral: Secondary | ICD-10-CM | POA: Diagnosis not present

## 2021-07-31 DIAGNOSIS — H0102A Squamous blepharitis right eye, upper and lower eyelids: Secondary | ICD-10-CM | POA: Diagnosis not present

## 2021-07-31 DIAGNOSIS — H02834 Dermatochalasis of left upper eyelid: Secondary | ICD-10-CM | POA: Diagnosis not present

## 2021-08-07 ENCOUNTER — Ambulatory Visit: Payer: Medicare HMO | Admitting: Internal Medicine

## 2021-08-09 ENCOUNTER — Encounter: Payer: Self-pay | Admitting: Internal Medicine

## 2021-08-09 ENCOUNTER — Ambulatory Visit: Payer: Medicare HMO | Admitting: Internal Medicine

## 2021-08-09 ENCOUNTER — Other Ambulatory Visit: Payer: Self-pay

## 2021-08-09 DIAGNOSIS — R0609 Other forms of dyspnea: Secondary | ICD-10-CM | POA: Diagnosis not present

## 2021-08-09 NOTE — Progress Notes (Signed)
Emily Humphrey, female    DOB: October 12, 1950, 71 y.o.   MRN: [id number]   Brief patient profile:  38 yowf  quit smoking 1996   self-referred to pulmonary clinic in [place]  02/17/2021 for copd with symptoms starting 2 m p quit smoking with initial dx asthma/ sinus dz at wt = 140 and daily symptoms on prednisone ever since.   Sinus surgery x early 2000s  = dx polyps / recurrent    History of Present Illness  02/17/2021  Pulmonary/ 1st office eval/ Emily Humphrey / [place] [facility]  Chief Complaint  Patient presents with   Consult    Patient has Asthma and COPD. Shortness of breath with exertion and chest tightness. Dry cough  Dyspnea:  mb and back to house wears her out even on pred 40 Cough: dry cough  Sleep: on side bed is horizontal / 2 pillows  SABA use: avg saba hfa sev times plus neb bid  Had hb resolved on ppi bid  Rec We will be referring you to allergy here in [place] > done 04/14/21  Pantoprazole (protonix) 40 mg  Take  30-60 min before first meal of the day and Pepcid (famotidine)  20 mg after supper until return to office GERD diet reviewed, bed blocks rec   Plan A = Automatic = Always=     Try breztri Take 2 puffs first thing in am and then another 2 puffs about 12 hours later.  Plan B = Backup (to supplement plan A, not to replace it) Only use your albuterol inhaler as a rescue medication Plan C = Crisis (instead of Plan B but only if Plan B stops working) - only use your albuterol nebulizer if you first try [place] to try albuterol 15 min before an activity (on alternating days)  that you know would usually make you short of breath    Plan D = Deltasone = prednisone >>> double the dose you usually take until better then one daily with breafast         08/09/2021  f/u ov/[place] office/Emily Humphrey re: doe s obst/ maint on breztri free from [place] but   still on prednisone 10 mg daily for asthma by PCP - was prev told could never stop it due to adrenal insuff Chief Complaint   Patient presents with   Follow-up    SOB and cough have improved a little since last OV.    Dyspnea:  not limited by breathing but by legs and feet  Cough: not much/ lots of nasal congestion Sleeping: flat  bed with 2-3 pillows  SABA use: nebulizer twice weekly  02: none  Covid status: "all the vax"      No obvious day to day or daytime variability or assoc excess/ purulent sputum or mucus plugs or hemoptysis or cp or chest tightness, subjective wheeze or overt  hb symptoms.   Sleeping  without nocturnal  or early am exacerbation  of respiratory  c/o's or need for noct saba. Also denies any obvious fluctuation of symptoms with weather or environmental changes or other aggravating or alleviating factors except as outlined above   No unusual exposure hx or h/o childhood pna/ asthma or knowledge of premature birth.  Current Allergies, Complete Past Medical History, Past Surgical History, Family History, and Social History were reviewed in [employer] record.  ROS  The following are not active complaints unless bolded Hoarseness, sore throat, dysphagia, dental problems, itching, sneezing,  nasal congestion or  discharge of excess mucus or purulent secretions, ear ache,   fever, chills, sweats, unintended wt loss or wt gain, classically pleuritic or exertional cp,  orthopnea pnd or arm/hand swelling  or leg swelling, presyncope, palpitations, abdominal pain, anorexia, nausea, vomiting, diarrhea  or change in bowel habits or change in bladder habits, change in stools or change in urine, dysuria, hematuria,  rash, arthralgias, visual complaints, headache, numbness, weakness or ataxia or problems with walking or coordination,  change in mood or  memory.        Current Meds  Medication Sig   albuterol (PROVENTIL) (2.5 MG/3ML) 0.083% nebulizer solution Take 2.5 mg by nebulization every 4 (four) hours as needed for wheezing or shortness of breath.   albuterol (VENTOLIN HFA)  108 (90 Base) MCG/ACT inhaler Inhale 2 puffs into the lungs every 4 (four) hours as needed.   Budeson-Glycopyrrol-Formoterol (BREZTRI AEROSPHERE) 160-9-4.8 MCG/ACT AERO Inhale 2 puffs into the lungs 2 (two) times daily.   Budeson-Glycopyrrol-Formoterol (BREZTRI AEROSPHERE) 160-9-4.8 MCG/ACT AERO Inhale 2 puffs into the lungs in the morning and at bedtime.   CALCIUM PO Take 1 tablet by mouth daily.   diazepam (VALIUM) 10 MG tablet Take 1 tablet by mouth at bedtime.   dicyclomine (BENTYL) 20 MG tablet Take 20 mg by mouth 3 (three) times daily as needed for spasms.   donepezil (ARICEPT) 10 MG tablet Take 1 tablet (10 mg total) by mouth at bedtime.   DULoxetine (CYMBALTA) 60 MG capsule    famotidine (PEPCID) 20 MG tablet    gabapentin (NEURONTIN) 600 MG tablet Take 600 mg by mouth 3 (three) times daily.   insulin NPH-regular Human (70-30) 100 UNIT/ML injection Inject 50 Units into the skin daily with breakfast. Plus 20 u in evening   ipratropium (ATROVENT) 0.03 % nasal spray Place 2 sprays into both nostrils 3 (three) times daily.   LORazepam (ATIVAN) 0.5 MG tablet Take 1 tablet by mouth as needed.   metFORMIN (GLUMETZA) 500 MG (MOD) 24 hr tablet Take 1,000 mg by mouth daily.   pantoprazole (PROTONIX) 40 MG tablet Take 1 tablet by mouth Daily.   predniSONE (DELTASONE) 10 MG tablet Take 10 mg by mouth daily.   predniSONE (DELTASONE) 10 MG tablet Take 1 tablet (10 mg total) by mouth daily with breakfast for 100 doses.   simvastatin (ZOCOR) 20 MG tablet Take 20 mg by mouth daily.   traMADol (ULTRAM) 50 MG tablet Take 50 mg by mouth 3 (three) times daily as needed.   VITAMIN D, CHOLECALCIFEROL, PO Take by mouth.               Past Medical History:  Diagnosis Date   Abnormal gait    Asthma    Chest pain    Hospital, May, 2012,  Pericarditis   COPD (chronic obstructive pulmonary disease) ([place])    Chronic steroid use   Dyslipidemia    Ejection fraction    Normal, echo, [id number]   GERD  (gastroesophageal reflux disease)    History of tobacco abuse    IDDM (insulin dependent diabetes mellitus)    Morbid obesity ([place])    OSA (obstructive sleep apnea)    mild/not using C-PAP    Pericardial effusion    Small, echo, circumferential, May, 2012   Pericarditis    Hospitalization, May, 2012   Pneumonia 2010   Tremor         Objective:      Wt Readings from Last 3 Encounters:  04/14/21 168 lb 9.6  oz (76.5 kg)  02/17/21 165 lb (74.8 kg)  09/06/20 175 lb (79.4 kg)      Vital signs reviewed  08/09/2021  - Note at rest 02 sats  98% on RA   General appearance:    slt cushingnoid pleasant amb wf nad     HEENT : pt wearing mask not removed for exam due to covid - 19 concerns.   NECK :  without JVD/Nodes/TM/ nl carotid upstrokes bilaterally   LUNGS: no acc muscle use,  Min barrel  contour chest wall with bilateral  slightly decreased bs s audible wheeze and  without cough on insp or exp maneuvers and min  Hyperresonant  to  percussion bilaterally     CV:  RRR  no s3 or murmur or increase in P2, and no edema   ABD:  obese soft and nontender with pos end  insp Hoover's  in the supine position. No bruits or organomegaly appreciated, bowel sounds nl  MS:   Nl gait/  ext warm without deformities, calf tenderness, cyanosis or clubbing No obvious joint restrictions   SKIN: warm and dry without lesions    NEURO:  alert, approp, nl sensorium with  no motor or cerebellar deficits apparent.            Assessment

## 2021-08-09 NOTE — Assessment & Plan Note (Addendum)
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  - referred to Allergy 02/17/2021  With Eos 0.4  IGE 403>  seen 04/14/21  - 02/17/2021   Walked on RA x  2  lap(s) =  approx 300 @ slow pace, stopped due to sob with lowest 02 sats 100%  - 02/17/2021  After extensive coaching inhaler device,  effectiveness =    75% > try breztri in place of trelegy x 2 week samples and fill rx if helping  -  02/17/21     alpha one AT phenotype  MM  level 142 -  04/07/21  Nl "V/Q"  - 04/14/21 spirometry s obst p am breztri c/w fully reversed asthma, albeit on prednisone 10 mg daily   Presently all goals of chronic asthma control met including optimal function and elimination of symptoms with minimal need for rescue therapy.  Contingencies discussed in full including contacting this office immediately if not controlling the symptoms using the rule of two's.     The problem is ongoing nasal congestion which has been attributed to recurrent nasal polyps per pt.  Encouraged to return to Allergy for consideration of biologics to get off prednisone or at least reduce to physiologic levels          Each maintenance medication was reviewed in detail including emphasizing most importantly the difference between maintenance and prns and under what circumstances the prns are to be triggered using an action plan format where appropriate.  Total time for H and P, chart review, counseling, reviewing hfa device(s) and generating customized AVS unique to this summary final  office visit / same day charting = 25 min

## 2021-08-09 NOTE — Patient Instructions (Addendum)
No change in medications  See Dr Ernst Bowler for dupixent injections  or alternative to reduce your need for prednisoe for asthma and polyps   If you are satisfied with your treatment plan,  let your doctor know and he/she can either refill your medications or you can return here when your prescription runs out.     If in any way you are not 100% satisfied,  please tell us.  If 100% better, tell your friends!  Pulmonary follow up is as needed

## 2021-08-10 ENCOUNTER — Encounter: Payer: Self-pay | Admitting: Internal Medicine

## 2021-08-15 DIAGNOSIS — E1165 Type 2 diabetes mellitus with hyperglycemia: Secondary | ICD-10-CM | POA: Diagnosis not present

## 2021-08-15 DIAGNOSIS — I10 Essential (primary) hypertension: Secondary | ICD-10-CM | POA: Diagnosis not present

## 2021-08-18 ENCOUNTER — Other Ambulatory Visit: Payer: Self-pay | Admitting: Allergy & Immunology

## 2021-08-21 DIAGNOSIS — G4700 Insomnia, unspecified: Secondary | ICD-10-CM | POA: Diagnosis not present

## 2021-08-21 DIAGNOSIS — E538 Deficiency of other specified B group vitamins: Secondary | ICD-10-CM | POA: Diagnosis not present

## 2021-08-21 DIAGNOSIS — I700 Atherosclerosis of aorta: Secondary | ICD-10-CM | POA: Diagnosis not present

## 2021-08-21 DIAGNOSIS — M25552 Pain in left hip: Secondary | ICD-10-CM | POA: Diagnosis not present

## 2021-08-21 DIAGNOSIS — M47819 Spondylosis without myelopathy or radiculopathy, site unspecified: Secondary | ICD-10-CM | POA: Diagnosis not present

## 2021-08-21 DIAGNOSIS — E559 Vitamin D deficiency, unspecified: Secondary | ICD-10-CM | POA: Diagnosis not present

## 2021-08-21 DIAGNOSIS — Z6828 Body mass index (BMI) 28.0-28.9, adult: Secondary | ICD-10-CM | POA: Diagnosis not present

## 2021-08-21 DIAGNOSIS — R531 Weakness: Secondary | ICD-10-CM | POA: Diagnosis not present

## 2021-08-28 ENCOUNTER — Other Ambulatory Visit: Payer: Self-pay

## 2021-08-28 ENCOUNTER — Telehealth: Payer: Self-pay | Admitting: Internal Medicine

## 2021-08-28 MED ORDER — ALBUTEROL SULFATE HFA 108 (90 BASE) MCG/ACT IN AERS: 2.0000 | INHALATION_SPRAY | RESPIRATORY_TRACT | 6 refills | Status: DC | PRN

## 2021-08-28 NOTE — Telephone Encounter (Signed)
Refill sent to [facility]. Nothing further needed.  ?

## 2021-08-31 ENCOUNTER — Institutional Professional Consult (permissible substitution): Payer: Medicare HMO | Admitting: Neurology

## 2021-09-04 DIAGNOSIS — M4690 Unspecified inflammatory spondylopathy, site unspecified: Secondary | ICD-10-CM | POA: Diagnosis not present

## 2021-09-04 DIAGNOSIS — M5126 Other intervertebral disc displacement, lumbar region: Secondary | ICD-10-CM | POA: Diagnosis not present

## 2021-09-04 DIAGNOSIS — M4316 Spondylolisthesis, lumbar region: Secondary | ICD-10-CM | POA: Diagnosis not present

## 2021-09-04 DIAGNOSIS — R531 Weakness: Secondary | ICD-10-CM | POA: Diagnosis not present

## 2021-09-07 DIAGNOSIS — E1142 Type 2 diabetes mellitus with diabetic polyneuropathy: Secondary | ICD-10-CM | POA: Diagnosis not present

## 2021-09-07 DIAGNOSIS — B351 Tinea unguium: Secondary | ICD-10-CM | POA: Diagnosis not present

## 2021-09-07 DIAGNOSIS — M79676 Pain in unspecified toe(s): Secondary | ICD-10-CM | POA: Diagnosis not present

## 2021-09-07 DIAGNOSIS — L84 Corns and callosities: Secondary | ICD-10-CM | POA: Diagnosis not present

## 2021-09-12 NOTE — Patient Instructions (Addendum)
1. Asthma-COPD overlap syndrome ([place]) ?- Continue with the Breztri per Dr. Melvyn Novas. ?-Continue prednisone 10 mg once a day as per your primary care physician ?-Please reach out to Tammy, our Biologics coordinator, who is tried contacting you about starting Dupixent to help your asthma.  The [place] office number is [phone].  Ask to speak with Tammy ? ?2. Chronic rhinitis (previous testing had non-reactive histamine) ?- Get the lab work that was ordered at your last office visit  ?- Continue with the loratadine '10mg'$  daily. ?- Continue Atrovent (ipratropium) one spray per nostril daily (can use up to 3 times daily if needed, but this can be overdrying). ? ?2. Schedule a follow up appointment in 2-3 months or sooner if needed ? ? ? ? ?

## 2021-09-13 ENCOUNTER — Ambulatory Visit: Payer: Medicare HMO | Admitting: Family

## 2021-09-13 ENCOUNTER — Other Ambulatory Visit: Payer: Self-pay

## 2021-09-13 ENCOUNTER — Encounter: Payer: Self-pay | Admitting: Family

## 2021-09-13 VITALS — BP 120/70 | HR 97 | Temp 97.1°F | Resp 16 | Ht 62.0 in | Wt 157.4 lb

## 2021-09-13 DIAGNOSIS — J449 Chronic obstructive pulmonary disease, unspecified: Secondary | ICD-10-CM | POA: Diagnosis not present

## 2021-09-13 DIAGNOSIS — J310 Chronic rhinitis: Secondary | ICD-10-CM | POA: Diagnosis not present

## 2021-09-13 MED ORDER — IPRATROPIUM BROMIDE 0.03 % NA SOLN: NASAL | 5 refills | Status: DC

## 2021-09-13 NOTE — Progress Notes (Signed)
? ?[address], [place] [address] ?Dept: [phone] ? ?FOLLOW UP NOTE ? ?Patient ID: Emily Humphrey, female    DOB: [birth year]  Age: 71 y.o. MRN: [id number] ?Date of Office Visit: 09/13/2021 ? ?Assessment  ?Chief Complaint: Asthma (Has a nebulizer at home that she has needed to use for a couple of weeks ) and Allergic Rhinitis  (Chest congestion with white clumpy mucus. She also has coughing. Nasal spray has not helped much ) ? ?HPI ?Emily Humphrey is a 71 year old female who presents today for follow-up of asthma-COPD overlap syndrome and chronic rhinitis.  She also has a history of nasal polyposis with surgery several years ago and chronic prednisone use.  Since her last office visit she denies any new diagnosis or surgeries, but reports that she is going to see a neurosurgeon on Friday for her legs.   ? ?Asthma-COPD overlap syndrome is reported as not well controlled with Breztri 2 puffs twice a day, prednisone 10 mg once a day from her primary care physician, and albuterol as needed.  She reports a productive cough with white sticky nasty sputum, sporadic wheezing, sporadic tightness in her chest, a lot of shortness of breath, and not a lot of nocturnal awakenings due to breathing problems.  She denies fever or chills.  Since her last office visit she has not required any trips to the emergency room or urgent care due to breathing problems.  About a month ago she called her primary care physician due to breathing problems and she was given doxycycline and increased her prednisone to 30 mg once a day and then decreased back down.  She reports that her shortness of breath is no worse since her last office visit but mentions that when she walks down the hallway she will get short of breath and sometimes it will go away on its own or other times she has to sit down for the shortness of breath go away.  She last saw her pulmonologist, Dr. Melvyn Novas, on August 09, 2021.  She is interested in starting  [place] depending on the cost.  Instructed her that Tammy had tried reaching out to her but her mailbox was full.  She reports that something is wrong with her phone in the phone that she wants is $1000 and she cannot afford this.  She has used her albuterol 3-4 times in the past 4 to 6 weeks. ? ?Chronic rhinitis is reported as moderately controlled with an over-the-counter [employer] brand antihistamine in Atrovent 1 spray each nostril once a day.  She reports a lot of clear rhinorrhea and denies nasal congestion postnasal drip.  She has not had any sinus infections since we last saw her.  She has not tried increasing her ipratropium bromide to see if that helps with the clear rhinorrhea. ? ? ?Drug Allergies:  ?Allergies  ?Allergen Reactions  ? Atorvastatin   ?  Other reaction(s): Muscle Pain  ? Penicillins Itching  ?  hives  ? Sulfa Antibiotics Itching  ?  rash  ? ? ?Review of Systems: ?Review of Systems  ?Constitutional:  Negative for chills and fever.  ?HENT:    ?     Reports a lot of clear rhinorrhea and denies nasal congestion and postnasal drip  ?Eyes:   ?     Denies itchy watery eyes  ?Respiratory:  Positive for cough, shortness of breath and wheezing.   ?     Reports productive cough with sticky white nasty sputum, sporadic wheezing, sporadic  tightness in her chest, a lot of shortness of breath, and not a lot of nocturnal awakenings due to breathing problems  ?Cardiovascular:  Negative for chest pain and palpitations.  ?Gastrointestinal:   ?     Denies heartburn or reflux symptoms  ?Skin:  Negative for itching and rash.  ?Neurological:  Negative for headaches.  ?Endo/Heme/Allergies:  Negative for environmental allergies.  ? ? ?Physical Exam: ?BP 120/70   Pulse 97   Temp (!) 97.1 ?F (36.2 ?C)   Resp 16   Ht '5\' 2"'$  (1.575 m)   Wt 157 lb 6.4 oz (71.4 kg)   SpO2 97%   BMI 28.79 kg/m?   ? ?Physical Exam ?Constitutional:   ?   Appearance: Normal appearance.  ?HENT:  ?   Head: Normocephalic and atraumatic.  ?    Comments: Pharynx normal, eyes normal, ears: Unable to see left tympanic membrane due to cerumen.  Right ear normal, nose: Bilateral lower turbinates mildly edematous and slightly erythematous with clear drainage noted. ?   Right Ear: Tympanic membrane, ear canal and external ear normal.  ?   Left Ear: Ear canal and external ear normal.  ?   Mouth/Throat:  ?   Mouth: Mucous membranes are moist.  ?   Pharynx: Oropharynx is clear.  ?Eyes:  ?   Conjunctiva/sclera: Conjunctivae normal.  ?Cardiovascular:  ?   Rate and Rhythm: Regular rhythm.  ?   Heart sounds: Normal heart sounds.  ?Pulmonary:  ?   Effort: Pulmonary effort is normal.  ?   Breath sounds: Normal breath sounds.  ?   Comments: Lungs clear to auscultation ?Musculoskeletal:  ?   Cervical back: Neck supple.  ?Skin: ?   General: Skin is warm.  ?Neurological:  ?   Mental Status: She is alert and oriented to person, place, and time.  ?Psychiatric:     ?   Mood and Affect: Mood normal.     ?   Behavior: Behavior normal.     ?   Thought Content: Thought content normal.     ?   Judgment: Judgment normal.  ? ? ?Diagnostics: ?FVC 1.17 L (44%).  FEV1 1.39 L (67%).  Predicted FVC 2.67 L, predicted FEV1 2.08 L.  Spirometry indicates possible moderate restriction. ? ?Assessment and Plan: ?1. Asthma-COPD overlap syndrome ([place])   ?2. Chronic rhinitis   ? ? ?Meds ordered this encounter  ?Medications  ? ipratropium (ATROVENT) 0.03 % nasal spray  ?  Sig: Place 1 spray in each nostril daily (can use up to 3 times daily if needed, but this can be over drying)  ?  Dispense:  30 mL  ?  Refill:  5  ? ? ?Patient Instructions  ?1. Asthma-COPD overlap syndrome ([place]) ?- Continue with the Breztri per Dr. Melvyn Novas. ?-Continue prednisone 10 mg once a day as per your primary care physician ?-Please reach out to Tammy, our Biologics coordinator, who is tried contacting you about starting Dupixent to help your asthma.  The [place] office number is [phone].  Ask to speak with Tammy ? ?2.  Chronic rhinitis (previous testing had non-reactive histamine) ?- Get the lab work that was ordered at your last office visit  ?- Continue with the loratadine '10mg'$  daily. ?- Continue Atrovent (ipratropium) one spray per nostril daily (can use up to 3 times daily if needed, but this can be overdrying). ? ?2. Schedule a follow up appointment in 2-3 months or sooner if needed ? ? ? ? ? ?Return in  about 3 months (around 12/14/2021), or if symptoms worsen or fail to improve. ?  ? ?Thank you for the opportunity to care for this patient.  Please do not hesitate to contact me with questions. ? ?Althea Charon, FNP ?[facility] ? ? ? ? ?

## 2021-09-14 ENCOUNTER — Telehealth: Payer: Self-pay | Admitting: *Deleted

## 2021-09-14 NOTE — Telephone Encounter (Signed)
Patient reached out to me to inquire how to get [place]. I advised that due to her MCR ins she will need to go thru patient assistance program and will mail app for her to return to me. I will f/u with Dupixent My way once submitted and touch bases with patient ?

## 2021-09-14 NOTE — Telephone Encounter (Signed)
Thank you :)

## 2021-09-15 DIAGNOSIS — I739 Peripheral vascular disease, unspecified: Secondary | ICD-10-CM | POA: Diagnosis not present

## 2021-09-15 LAB — ALLERGENS W/COMP RFLX AREA 2
Alternaria Alternata IgE: <0.1 kU/L
Aspergillus Fumigatus IgE: <0.1 kU/L
Bermuda Grass IgE: <0.1 kU/L
Cedar, Mountain IgE: <0.1 kU/L
Cladosporium Herbarum IgE: <0.1 kU/L
Cockroach, German IgE: <0.1 kU/L
Common Silver Birch IgE: <0.1 kU/L
Cottonwood IgE: <0.1 kU/L
D Farinae IgE: <0.1 kU/L
D Pteronyssinus IgE: <0.1 kU/L
E001-IgE Cat Dander: <0.1 kU/L
E005-IgE Dog Dander: <0.1 kU/L
Elm, American IgE: <0.1 kU/L
IgE (Immunoglobulin E), Serum: 431 IU/mL (ref 6–495)
Johnson Grass IgE: <0.1 kU/L
Maple/Box Elder IgE: <0.1 kU/L
Mouse Urine IgE: <0.1 kU/L
Oak, White IgE: <0.1 kU/L
Pecan, Hickory IgE: <0.1 kU/L
Penicillium Chrysogen IgE: <0.1 kU/L
Pigweed, Rough IgE: <0.1 kU/L
Ragweed, Short IgE: <0.1 kU/L
Sheep Sorrel IgE Qn: <0.1 kU/L
Timothy Grass IgE: <0.1 kU/L
White Mulberry IgE: <0.1 kU/L

## 2021-09-15 LAB — ALLERGEN PROFILE, MOLD
Aureobasidi Pullulans IgE: <0.1 kU/L
Candida Albicans IgE: 0.14 kU/L — AB
M009-IgE Fusarium proliferatum: <0.1 kU/L
M014-IgE Epicoccum purpur: <0.1 kU/L
Mucor Racemosus IgE: <0.1 kU/L
Phoma Betae IgE: <0.1 kU/L
Setomelanomma Rostrat: <0.1 kU/L
Stemphylium Herbarum IgE: 0.11 kU/L — AB

## 2021-09-18 DIAGNOSIS — Z13220 Encounter for screening for lipoid disorders: Secondary | ICD-10-CM | POA: Diagnosis not present

## 2021-09-18 DIAGNOSIS — E782 Mixed hyperlipidemia: Secondary | ICD-10-CM | POA: Diagnosis not present

## 2021-09-18 DIAGNOSIS — E1165 Type 2 diabetes mellitus with hyperglycemia: Secondary | ICD-10-CM | POA: Diagnosis not present

## 2021-09-18 DIAGNOSIS — R5383 Other fatigue: Secondary | ICD-10-CM | POA: Diagnosis not present

## 2021-09-18 DIAGNOSIS — Z1329 Encounter for screening for other suspected endocrine disorder: Secondary | ICD-10-CM | POA: Diagnosis not present

## 2021-09-18 DIAGNOSIS — K219 Gastro-esophageal reflux disease without esophagitis: Secondary | ICD-10-CM | POA: Diagnosis not present

## 2021-09-18 DIAGNOSIS — E7849 Other hyperlipidemia: Secondary | ICD-10-CM | POA: Diagnosis not present

## 2021-09-18 DIAGNOSIS — I10 Essential (primary) hypertension: Secondary | ICD-10-CM | POA: Diagnosis not present

## 2021-09-26 ENCOUNTER — Other Ambulatory Visit: Payer: Self-pay | Admitting: Internal Medicine

## 2021-09-26 NOTE — Telephone Encounter (Signed)
Pt's inhaler was refilled 3/13. Nothing further needed. ?

## 2021-09-27 ENCOUNTER — Telehealth: Payer: Self-pay | Admitting: Internal Medicine

## 2021-09-27 MED ORDER — BREZTRI AEROSPHERE 160-9-4.8 MCG/ACT IN AERO: 2.0000 | INHALATION_SPRAY | Freq: Two times a day (BID) | RESPIRATORY_TRACT | 11 refills | Status: DC

## 2021-09-27 NOTE — Telephone Encounter (Signed)
Rx printed and placed In Dr. Morrison Old folder for him to sign on Friday.  ?Will update encounter once signed and faxed to [facility]  ?

## 2021-09-29 DIAGNOSIS — I10 Essential (primary) hypertension: Secondary | ICD-10-CM | POA: Diagnosis not present

## 2021-09-29 DIAGNOSIS — E7849 Other hyperlipidemia: Secondary | ICD-10-CM | POA: Diagnosis not present

## 2021-09-29 DIAGNOSIS — R0989 Other specified symptoms and signs involving the circulatory and respiratory systems: Secondary | ICD-10-CM | POA: Diagnosis not present

## 2021-09-29 DIAGNOSIS — E875 Hyperkalemia: Secondary | ICD-10-CM | POA: Diagnosis not present

## 2021-09-29 DIAGNOSIS — E1165 Type 2 diabetes mellitus with hyperglycemia: Secondary | ICD-10-CM | POA: Diagnosis not present

## 2021-09-29 DIAGNOSIS — E1140 Type 2 diabetes mellitus with diabetic neuropathy, unspecified: Secondary | ICD-10-CM | POA: Diagnosis not present

## 2021-09-29 DIAGNOSIS — Z6827 Body mass index (BMI) 27.0-27.9, adult: Secondary | ICD-10-CM | POA: Diagnosis not present

## 2021-09-29 NOTE — Telephone Encounter (Signed)
RX signed and faxed to [facility].  ?ATC patient to notify.  ?No answer and VM is full.  ? ?

## 2021-10-02 DIAGNOSIS — H02206 Unspecified lagophthalmos left eye, unspecified eyelid: Secondary | ICD-10-CM | POA: Diagnosis not present

## 2021-10-02 DIAGNOSIS — H16223 Keratoconjunctivitis sicca, not specified as Sjogren's, bilateral: Secondary | ICD-10-CM | POA: Diagnosis not present

## 2021-10-02 DIAGNOSIS — H0102A Squamous blepharitis right eye, upper and lower eyelids: Secondary | ICD-10-CM | POA: Diagnosis not present

## 2021-10-02 DIAGNOSIS — H0102B Squamous blepharitis left eye, upper and lower eyelids: Secondary | ICD-10-CM | POA: Diagnosis not present

## 2021-10-02 DIAGNOSIS — H11823 Conjunctivochalasis, bilateral: Secondary | ICD-10-CM | POA: Diagnosis not present

## 2021-10-02 DIAGNOSIS — H02834 Dermatochalasis of left upper eyelid: Secondary | ICD-10-CM | POA: Diagnosis not present

## 2021-10-02 DIAGNOSIS — H02831 Dermatochalasis of right upper eyelid: Secondary | ICD-10-CM | POA: Diagnosis not present

## 2021-10-02 DIAGNOSIS — H16211 Exposure keratoconjunctivitis, right eye: Secondary | ICD-10-CM | POA: Diagnosis not present

## 2021-10-02 DIAGNOSIS — H02203 Unspecified lagophthalmos right eye, unspecified eyelid: Secondary | ICD-10-CM | POA: Diagnosis not present

## 2021-10-14 DIAGNOSIS — R350 Frequency of micturition: Secondary | ICD-10-CM | POA: Diagnosis not present

## 2021-10-14 DIAGNOSIS — I10 Essential (primary) hypertension: Secondary | ICD-10-CM | POA: Diagnosis not present

## 2021-10-14 DIAGNOSIS — Z6828 Body mass index (BMI) 28.0-28.9, adult: Secondary | ICD-10-CM | POA: Diagnosis not present

## 2021-10-14 DIAGNOSIS — E875 Hyperkalemia: Secondary | ICD-10-CM | POA: Diagnosis not present

## 2021-10-27 ENCOUNTER — Ambulatory Visit (HOSPITAL_COMMUNITY): Payer: Medicare HMO | Attending: Neurosurgery | Admitting: Physical Therapy

## 2021-10-27 DIAGNOSIS — M6281 Muscle weakness (generalized): Secondary | ICD-10-CM

## 2021-10-27 DIAGNOSIS — R262 Difficulty in walking, not elsewhere classified: Secondary | ICD-10-CM

## 2021-10-27 DIAGNOSIS — M79605 Pain in left leg: Secondary | ICD-10-CM | POA: Diagnosis not present

## 2021-10-27 DIAGNOSIS — M79604 Pain in right leg: Secondary | ICD-10-CM | POA: Diagnosis not present

## 2021-10-27 DIAGNOSIS — M5459 Other low back pain: Secondary | ICD-10-CM | POA: Diagnosis not present

## 2021-10-27 NOTE — Therapy (Signed)
?OUTPATIENT PHYSICAL THERAPY LOWER EXTREMITY EVALUATION ? ? ?Patient Name: Emily Humphrey ?MRN: [id number] ?DOB:Apr 24, 1951, 71 y.o., female ?Today's Date: 10/27/2021 ? ? PT End of Session - 10/27/21 1524   ? ? Visit Number 1   ? Number of Visits 12   ? Date for PT Re-Evaluation 12/08/21   ? Authorization Type [employer]   ? Progress Note Due on Visit 10   ? PT Start Time 1320   ? PT Stop Time 1400   ? PT Time Calculation (min) 40 min   ? Activity Tolerance Patient tolerated treatment well   ? Behavior During Therapy [facility] for tasks assessed/performed   ? ?  ?  ? ?  ? ? ?Past Medical History:  ?Diagnosis Date  ? Abnormal gait   ? Asthma   ? Chest pain   ? Hospital, May, 2012,  Pericarditis  ? COPD (chronic obstructive pulmonary disease) ([place])   ? Chronic steroid use  ? Dyslipidemia   ? Ejection fraction   ? Normal, echo, [facility],3790  ? GERD (gastroesophageal reflux disease)   ? History of tobacco abuse   ? IDDM (insulin dependent diabetes mellitus)   ? Morbid obesity ([place])   ? OSA (obstructive sleep apnea)   ? mild/not using C-PAP   ? Pericardial effusion   ? Small, echo, circumferential, May, 2012  ? Pericarditis   ? Hospitalization, May, 2012  ? Pneumonia 2010  ? Tremor   ? ?Past Surgical History:  ?Procedure Laterality Date  ? ABDOMINAL HYSTERECTOMY    ? NASAL SINUS SURGERY    ? Removal of throat nodules    ? vocal cored nodules  ? ?Patient Active Problem List  ? Diagnosis Date Noted  ? DOE (dyspnea on exertion) 02/17/2021  ? Cerebral vascular disease 09/06/2020  ? Mild cognitive impairment 09/06/2020  ? Abnormal finding on MRI of brain 05/10/2020  ? Gait abnormality 05/10/2020  ? Essential hypertension, benign 08/01/2015  ? Type 2 diabetes mellitus, uncontrolled ([place])   ? Hyperlipidemia   ? COPD (chronic obstructive pulmonary disease) ([place])   ? History of tobacco abuse   ? Asthma   ? GERD (gastroesophageal reflux disease)   ? Morbid obesity ([place])   ? Chest pain   ? Pericardial effusion   ? Ejection fraction   ? Pericarditis    ? ? ?PCP:  Lanelle Bal ? ?REFERRING PROVIDER: Vallarie Mare, MD ? ?REFERRING DIAG: B LE claudication  ? ?THERAPY DIAG:  ?Pain in right leg ? ?Pain in left leg ? ?Muscle weakness (generalized) ? ?Other low back pain ? ?Difficulty in walking, not elsewhere classified ? ?ONSET DATE:  Pt states her back has been bothering her for a year.  Her leg pain has been going on for about six months.   ? ?SUBJECTIVE:  ? ?SUBJECTIVE STATEMENT:   ?PT states that the completed an MRI which showed a lot of inflammation.  She went to the neurologist who stated that he could give her shots but he would prefer her to come to therapy.  She states that she is having a lot of difficulty walking.  She states that if she walks greater than  ?Ten minutes with the pain medication.  PT states that some days she can barely get from her porch to her mailbox which is 30 ft away.  She feels that her legs are going to give out.  She does have an appointment with a vascular MD.  ? ? ?PERTINENT HISTORY: ?Gait abnormality, DM, COPD ? ?  PAIN:  ?Are you having pain? No   pt has taken pain medication.  Highest back pain:  7/10; highest leg pain 7/10 .  Leg pain is the whole leg  ? ?PRECAUTIONS: Fall ? ?WEIGHT BEARING RESTRICTIONS No ? ?FALLS:  ?Has patient fallen in last 6 months? Yes. Number of falls-2 ? ?LIVING ENVIRONMENT: ?Lives with: lives alone ?Lives in: House/apartment ?Stairs: No ?Has following equipment at home: has a cane but does not use  ? ?OCCUPATION:  retired ? ?PLOF: Independent ? ?PATIENT GOALS  To be able to get out and walk in the malls and shopping and have less pain.   To be able to work in her flower garden.  ? ? ?OBJECTIVE:  ? ?COGNITION: ? Overall cognitive status: Within functional limits for tasks assessed   ?  ?SENSATION: ?Not tested ? ?POSTURE:  ?Pt has increased Kyphosis, decrease lordosis ?Back ROM: ?                 Flex: ?                 Ext: Functional reps cause no sx changes ?                 ? ?LE MMT: ? ?MMT  Right ?10/27/2021 Left ?10/27/2021  ?Hip flexion 3/5 3/5  ?Hip extension 3/5 3/5  ?Hip abduction 3/5 3+/5  ?Hip adduction    ?Hip internal rotation    ?Hip external rotation    ?Knee flexion 3+/5 3+/5  ?Knee extension 5/5 5/5  ?Ankle dorsiflexion 4+/5 4/5  ?Ankle plantarflexion    ?Ankle inversion    ?Ankle eversion    ? (Blank rows = not tested) ? ?FUNCTIONAL TESTS:  ?30 seconds chair stand test:  10 in 30 seconds below average for age.  ? Single leg stance: Lt 4 sec, RT 1 sec  ?2 minute walking test: 342 ft.  Pt walks with posterior lean, flat footed ? ? ? ? ?TODAY'S TREATMENT: ?Ab set x 5 ?Knee to chest x 3  ?B LE hip abduction x 5 ?Bridge x 5  ? ? ?PATIENT EDUCATION:  ?Education details: HEp  ?Person educated: Patient ?Education method: Explanation, Tactile cues, Verbal cues, and Handouts ?Education comprehension: verbalized understanding ? ? ?HOME EXERCISE PROGRAM: ?Ab set x 5 ?B LE hip abduction x 5 ?Bridge x 5  ?            Knee to chest x 3  ? ?ASSESSMENT: ? ?CLINICAL IMPRESSION: ?Patient is a 71 y.o. female who was seen today for physical therapy evaluation and treatment for Bilateral leg pain. Evaluation demonstrates decreased strength, decreased balance, postural dysfunction, increased pain and abnormal gait.  [name] will benefit from skilled PT to address these issues and maximize her functional ability ? ? ?OBJECTIVE IMPAIRMENTS Abnormal gait, decreased activity tolerance, decreased balance, difficulty walking, decreased strength, postural dysfunction, and pain.  ? ?ACTIVITY LIMITATIONS cleaning, community activity, meal prep, laundry, yard work, shopping, and yard work.  ? ?PERSONAL FACTORS Fitness and Time since onset of injury/illness/exacerbation are also affecting patient's functional outcome.  ? ? ?REHAB POTENTIAL: Good ? ?CLINICAL DECISION MAKING: Evolving/moderate complexity ? ?EVALUATION COMPLEXITY: Moderate ? ? ?GOALS: ?Goals reviewed with patient? No ? ?SHORT TERM GOALS: Target date: 11/17/2021    ? ?Pt to be I with HEP in order for her pain to greater  no greater than a 5/10 to be able to walk for 15 minutes with confidence. ?Baseline: ?Goal status: INITIAL ? ?  2.  Pt mm strength to be increased 1/2 grade to allow pt to come sit to stand 12 x in 30"  ?Baseline:  ?Goal status: INITIAL ? ?3.  Pt to be able to single leg stance for 10 second on each leg to reduce risk of falling  ?Baseline:  ?Goal status: INITIAL ? ?LONG TERM GOALS: Target date: 12/08/2021   ? ?PT to be I in an advanced HEP in order to allow pt to state that she is able to walk for 25 minutes with confidence  ?Baseline:  ?Goal status: INITIAL ? ?2.  Pt pain level to be no greater than a 3/10  ?Baseline:  ?Goal status: INITIAL ? ?3.  PT to be walking without exhibiting a posterior lean  ?Baseline:  ?Goal status: INITIAL ? ?4.  PT mm strength to be increased one grade to be able to go up and down  4steps in a reciprocal manner  ?Baseline:  ?Goal status: INITIAL ? ? ? ? ?PLAN: ?PT FREQUENCY: 2x/week ? ?PT DURATION: 6 weeks ? ?PLANNED INTERVENTIONS: Therapeutic exercises, Therapeutic activity, Neuromuscular re-education, Balance training, Gait training, and Patient/Family education ? ?PLAN FOR NEXT SESSION: Begin heel raises/toe raises, functional squats, side steps, Single leg stance.  Continue to work on balance and strengthening.  Make sure pt has started a home walking program.  ? ?Rayetta Humphrey, PT CLT ?[phone]  ?10/27/2021, 3:48 PM  ?

## 2021-10-31 ENCOUNTER — Ambulatory Visit (HOSPITAL_COMMUNITY): Payer: Medicare HMO

## 2021-10-31 ENCOUNTER — Encounter (HOSPITAL_COMMUNITY): Payer: Self-pay

## 2021-10-31 DIAGNOSIS — M79604 Pain in right leg: Secondary | ICD-10-CM | POA: Diagnosis not present

## 2021-10-31 DIAGNOSIS — M5459 Other low back pain: Secondary | ICD-10-CM

## 2021-10-31 DIAGNOSIS — M79605 Pain in left leg: Secondary | ICD-10-CM | POA: Diagnosis not present

## 2021-10-31 DIAGNOSIS — R262 Difficulty in walking, not elsewhere classified: Secondary | ICD-10-CM

## 2021-10-31 DIAGNOSIS — M6281 Muscle weakness (generalized): Secondary | ICD-10-CM

## 2021-10-31 NOTE — Therapy (Signed)
?OUTPATIENT PHYSICAL THERAPY LOWER EXTREMITY EVALUATION ? ? ?Patient Name: Emily Humphrey ?MRN: [id number] ?DOB:1950/10/14, 71 y.o., female ?Today's Date: 10/31/2021 ? ? PT End of Session - 10/31/21 0945   ? ? Visit Number 2   ? Number of Visits 12   ? Date for PT Re-Evaluation 12/08/21   ? Authorization Type [employer]   ? Progress Note Due on Visit 10   ? PT Start Time 3976   ? PT Stop Time 1025   ? PT Time Calculation (min) 40 min   ? Activity Tolerance Patient tolerated treatment well   ? Behavior During Therapy [facility] for tasks assessed/performed   ? ?  ?  ? ?  ? ? ?Past Medical History:  ?Diagnosis Date  ? Abnormal gait   ? Asthma   ? Chest pain   ? Hospital, May, 2012,  Pericarditis  ? COPD (chronic obstructive pulmonary disease) ([place])   ? Chronic steroid use  ? Dyslipidemia   ? Ejection fraction   ? Normal, echo, [facility],1937  ? GERD (gastroesophageal reflux disease)   ? History of tobacco abuse   ? IDDM (insulin dependent diabetes mellitus)   ? Morbid obesity ([place])   ? OSA (obstructive sleep apnea)   ? mild/not using C-PAP   ? Pericardial effusion   ? Small, echo, circumferential, May, 2012  ? Pericarditis   ? Hospitalization, May, 2012  ? Pneumonia 2010  ? Tremor   ? ?Past Surgical History:  ?Procedure Laterality Date  ? ABDOMINAL HYSTERECTOMY    ? NASAL SINUS SURGERY    ? Removal of throat nodules    ? vocal cored nodules  ? ?Patient Active Problem List  ? Diagnosis Date Noted  ? DOE (dyspnea on exertion) 02/17/2021  ? Cerebral vascular disease 09/06/2020  ? Mild cognitive impairment 09/06/2020  ? Abnormal finding on MRI of brain 05/10/2020  ? Gait abnormality 05/10/2020  ? Essential hypertension, benign 08/01/2015  ? Type 2 diabetes mellitus, uncontrolled ([place])   ? Hyperlipidemia   ? COPD (chronic obstructive pulmonary disease) ([place])   ? History of tobacco abuse   ? Asthma   ? GERD (gastroesophageal reflux disease)   ? Morbid obesity ([place])   ? Chest pain   ? Pericardial effusion   ? Ejection fraction   ? Pericarditis    ? ? ?PCP:  Lanelle Bal ? ?REFERRING PROVIDER: Vallarie Mare, MD ? ?REFERRING DIAG: B LE claudication  ? ?THERAPY DIAG:  ?Pain in right leg ? ?Pain in left leg ? ?Other low back pain ? ?Muscle weakness (generalized) ? ?Difficulty in walking, not elsewhere classified ? ?ONSET DATE:  Pt states her back has been bothering her for a year.  Her leg pain has been going on for about six months.   ? ?SUBJECTIVE:  ? ?SUBJECTIVE STATEMENT:   ?Patient states that her back is doing pretty good this morning. She hasnt used her legs too much this morning so no pain. Patient reports that she is working on setting up vascular MD appointment.  ?  ? ? ?PERTINENT HISTORY: ?Gait abnormality, DM, COPD ? ?PAIN:  ?PAIN:  ?Are you having pain? Yes: NPRS scale: 2/10 ?Pain location: back ?Pain description: dull, ache, cramping ?Aggravating factors: walking ?Relieving factors: sitting   ? ?PRECAUTIONS: Fall ? ?WEIGHT BEARING RESTRICTIONS No ? ?FALLS:  ?Has patient fallen in last 6 months? Yes. Number of falls-2 ? ?LIVING ENVIRONMENT: ?Lives with: lives alone ?Lives in: House/apartment ?Stairs: No ?Has following equipment at home: has a  cane but does not use  ? ?OCCUPATION:  retired ? ?PLOF: Independent ? ?PATIENT GOALS  To be able to get out and walk in the malls and shopping and have less pain.   To be able to work in her flower garden.  ? ? ?OBJECTIVE:  ? ?COGNITION: ? Overall cognitive status: Within functional limits for tasks assessed   ?  ?SENSATION: ?Not tested ? ?POSTURE:  ?Pt has increased Kyphosis, decrease lordosis ?Back ROM: ?                 Flex: ?                 Ext: Functional reps cause no sx changes ?                 ? ?LE MMT: ? ?MMT Right ?10/31/2021 Left ?10/31/2021  ?Hip flexion 3/5 3/5  ?Hip extension 3/5 3/5  ?Hip abduction 3/5 3+/5  ?Hip adduction    ?Hip internal rotation    ?Hip external rotation    ?Knee flexion 3+/5 3+/5  ?Knee extension 5/5 5/5  ?Ankle dorsiflexion 4+/5 4/5  ?Ankle plantarflexion    ?Ankle  inversion    ?Ankle eversion    ? (Blank rows = not tested) ? ?FUNCTIONAL TESTS:  ?30 seconds chair stand test:  10 in 30 seconds below average for age.  ? Single leg stance: Lt 4 sec, RT 1 sec  ?2 minute walking test: 342 ft.  Pt walks with posterior lean, flat footed ? ? ? ? ?TODAY'S TREATMENT: ? 10/31/21 ? Nustep 4 mins x level 3 ? HR/TR 2x10 ?  Chair squat 2x10 ? Side steps with leg lifts 10'x6 ? Educated and discussed walking program ? LTR with Pbx30 ? PB DKTC x20 ? Modified deadbug/iso abdominal 2x10 ? Seated PB forward rolls-> then lateral rolls x20 each  ? All at SBA ?  ? ?  ? ?Ab set x 5 ?Knee to chest x 3  ?B LE hip abduction x 5 ?Bridge x 5  ? ? ?PATIENT EDUCATION:  ?Education details: HEp  ?Person educated: Patient ?Education method: Explanation, Tactile cues, Verbal cues, and Handouts ?Education comprehension: verbalized understanding ? ? ?HOME EXERCISE PROGRAM: ?Ab set x 5 ?B LE hip abduction x 5 ?Bridge x 5  ?            Knee to chest x 3  ? ?ASSESSMENT: ? ?CLINICAL IMPRESSION: ?Patient with minimal reports of Bilateral leg pain throughout session, reports she feels the muscles working but it is not pain. Patient reports low back stiffness with LTR and DKTC, reports improvement post PB rolls forward and lateral. Pt will continue to benefit from skilled PT to address issues of pain, rom and decreased endurance and maximize her functional ability ? ? ?OBJECTIVE IMPAIRMENTS Abnormal gait, decreased activity tolerance, decreased balance, difficulty walking, decreased strength, postural dysfunction, and pain.  ? ?ACTIVITY LIMITATIONS cleaning, community activity, meal prep, laundry, yard work, shopping, and yard work.  ? ?PERSONAL FACTORS Fitness and Time since onset of injury/illness/exacerbation are also affecting patient's functional outcome.  ? ? ?REHAB POTENTIAL: Good ? ?CLINICAL DECISION MAKING: Evolving/moderate complexity ? ?EVALUATION COMPLEXITY: Moderate ? ? ?GOALS: ?Goals reviewed with patient?  No ? ?SHORT TERM GOALS: Target date: 11/17/2021   ? ?Pt to be I with HEP in order for her pain to greater  no greater than a 5/10 to be able to walk for 15 minutes with confidence. ?Baseline: ?Goal status: INITIAL ? ?  2.  Pt mm strength to be increased 1/2 grade to allow pt to come sit to stand 12 x in 30"  ?Baseline:  ?Goal status: INITIAL ? ?3.  Pt to be able to single leg stance for 10 second on each leg to reduce risk of falling  ?Baseline:  ?Goal status: INITIAL ? ?LONG TERM GOALS: Target date: 12/08/2021   ? ?PT to be I in an advanced HEP in order to allow pt to state that she is able to walk for 25 minutes with confidence  ?Baseline:  ?Goal status: INITIAL ? ?2.  Pt pain level to be no greater than a 3/10  ?Baseline:  ?Goal status: INITIAL ? ?3.  PT to be walking without exhibiting a posterior lean  ?Baseline:  ?Goal status: INITIAL ? ?4.  PT mm strength to be increased one grade to be able to go up and down  4steps in a reciprocal manner  ?Baseline:  ?Goal status: INITIAL ? ? ? ? ?PLAN: ?PT FREQUENCY: 2x/week ? ?PT DURATION: 6 weeks ? ?PLANNED INTERVENTIONS: Therapeutic exercises, Therapeutic activity, Neuromuscular re-education, Balance training, Gait training, and Patient/Family education ? ?PLAN FOR NEXT SESSION: Begin incorporating Single leg stance balance  Continue to work on standing/exercise tolerance and incorporating back strength and mobility.   ? ?Lux Skilton PT, DPT  ?10/31/2021, 10:43 AM  ?

## 2021-11-06 ENCOUNTER — Ambulatory Visit (INDEPENDENT_AMBULATORY_CARE_PROVIDER_SITE_OTHER): Payer: Medicare HMO

## 2021-11-06 DIAGNOSIS — J4540 Moderate persistent asthma, uncomplicated: Secondary | ICD-10-CM | POA: Diagnosis not present

## 2021-11-06 MED ORDER — DUPILUMAB 300 MG/2ML ~~LOC~~ SOSY
300.0000 mg | PREFILLED_SYRINGE | SUBCUTANEOUS | Status: AC
  Administered 2021-11-06: 300 mg via SUBCUTANEOUS

## 2021-11-06 NOTE — Progress Notes (Signed)
Immunotherapy   Patient Details  Name: Emily Humphrey MRN: [id number] Date of Birth: 10-Apr-1951  11/06/2021  [facility] started injections for  Dupixent for at home administration.   Frequency: Every two weeks.  Epi-Pen:Not needed.  Consent signed and patient instructions given in office. Patient expressed that she gives herself insulin injections daily. I taught her to to properly use Dupixent and watched her during training. After she was comfortable she injected herself with two two three hundred mg auto injectables with no issue. Patient waited in the office for thirty minutes without an issue.    Julius Bowels 11/06/2021, 5:13 PM

## 2021-11-09 ENCOUNTER — Encounter (HOSPITAL_COMMUNITY): Payer: Self-pay

## 2021-11-09 ENCOUNTER — Ambulatory Visit (HOSPITAL_COMMUNITY): Payer: Medicare HMO

## 2021-11-09 DIAGNOSIS — M79605 Pain in left leg: Secondary | ICD-10-CM | POA: Diagnosis not present

## 2021-11-09 DIAGNOSIS — R262 Difficulty in walking, not elsewhere classified: Secondary | ICD-10-CM

## 2021-11-09 DIAGNOSIS — M6281 Muscle weakness (generalized): Secondary | ICD-10-CM

## 2021-11-09 DIAGNOSIS — M5459 Other low back pain: Secondary | ICD-10-CM

## 2021-11-09 DIAGNOSIS — M79604 Pain in right leg: Secondary | ICD-10-CM

## 2021-11-09 NOTE — Therapy (Signed)
OUTPATIENT PHYSICAL THERAPY LOWER EXTREMITY EVALUATION   Patient Name: Emily Humphrey MRN: [id number] DOB:1950/08/29, 71 y.o., female Today's Date: 11/09/2021   PT End of Session - 11/09/21 0943     Visit Number 3    Number of Visits 12    Date for PT Re-Evaluation 12/08/21    Authorization Type [employer]    Progress Note Due on Visit 10    PT Start Time 0945    PT Stop Time 1025    PT Time Calculation (min) 40 min    Activity Tolerance Patient tolerated treatment well    Behavior During Therapy [facility] for tasks assessed/performed             Past Medical History:  Diagnosis Date   Abnormal gait    Asthma    Chest pain    Hospital, May, 2012,  Pericarditis   COPD (chronic obstructive pulmonary disease) ([place])    Chronic steroid use   Dyslipidemia    Ejection fraction    Normal, echo, [facility]U,2025   GERD (gastroesophageal reflux disease)    History of tobacco abuse    IDDM (insulin dependent diabetes mellitus)    Morbid obesity ([place])    OSA (obstructive sleep apnea)    mild/not using C-PAP    Pericardial effusion    Small, echo, circumferential, May, 2012   Pericarditis    Hospitalization, May, 2012   Pneumonia 2010   Tremor    Past Surgical History:  Procedure Laterality Date   ABDOMINAL HYSTERECTOMY     NASAL SINUS SURGERY     Removal of throat nodules     vocal cored nodules   Patient Active Problem List   Diagnosis Date Noted   DOE (dyspnea on exertion) 02/17/2021   Cerebral vascular disease 09/06/2020   Mild cognitive impairment 09/06/2020   Abnormal finding on MRI of brain 05/10/2020   Gait abnormality 05/10/2020   Essential hypertension, benign 08/01/2015   Type 2 diabetes mellitus, uncontrolled ([place])    Hyperlipidemia    COPD (chronic obstructive pulmonary disease) ([place])    History of tobacco abuse    Asthma    GERD (gastroesophageal reflux disease)    Morbid obesity ([place])    Chest pain    Pericardial effusion    Ejection fraction    Pericarditis      PCP:  Lanelle Bal  REFERRING PROVIDER: Vallarie Mare, MD  REFERRING DIAG: B LE claudication   THERAPY DIAG:  Pain in right leg  Pain in left leg  Other low back pain  Muscle weakness (generalized)  Difficulty in walking, not elsewhere classified  ONSET DATE:  Pt states her back has been bothering her for a year.  Her leg pain has been going on for about six months.    SUBJECTIVE:   SUBJECTIVE STATEMENT:   Patient states that she has been working on HEP and feels sore after. Pt reports that she has taken medications that are causing her to experience some nausea and digestive issues this morning and request that we take the session a bit easier.    PERTINENT HISTORY: Gait abnormality, DM, COPD  PAIN:  PAIN:  Are you having pain? Yes: NPRS scale: 2/10 Pain location: back Pain description: dull, ache, cramping Aggravating factors: walking Relieving factors: sitting    PRECAUTIONS: Fall  WEIGHT BEARING RESTRICTIONS No  FALLS:  Has patient fallen in last 6 months? Yes. Number of falls-2  LIVING ENVIRONMENT: Lives with: lives alone Lives in: House/apartment Stairs: No Has  following equipment at home: has a cane but does not use   OCCUPATION:  retired  PLOF: Independent  PATIENT GOALS  To be able to get out and walk in the malls and shopping and have less pain.   To be able to work in her flower garden.    OBJECTIVE:   COGNITION:  Overall cognitive status: Within functional limits for tasks assessed     SENSATION: Not tested  POSTURE:  Pt has increased Kyphosis, decrease lordosis Back ROM:                  Flex:                  Ext: Functional reps cause no sx changes                   LE MMT:  MMT Right 11/09/2021 Left 11/09/2021  Hip flexion 3/5 3/5  Hip extension 3/5 3/5  Hip abduction 3/5 3+/5  Hip adduction    Hip internal rotation    Hip external rotation    Knee flexion 3+/5 3+/5  Knee extension 5/5 5/5  Ankle dorsiflexion  4+/5 4/5  Ankle plantarflexion    Ankle inversion    Ankle eversion     (Blank rows = not tested)  FUNCTIONAL TESTS:  30 seconds chair stand test:  10 in 30 seconds below average for age.   Single leg stance: Lt 4 sec, RT 1 sec  2 minute walking test: 342 ft.  Pt walks with posterior lean, flat footed     TODAY'S TREATMENT:  11/09/21  Nustep level 3 x5 mins  PB seated forward rolls x15  Seated hamstring stretch 10 sec hold x5 b/l   Seated piriformis stretch Stair stretch x5 with 5 sec holds performed b/l DKTC with ppt for low ab engagement 3x10 Clamshell red tband  hooklying Shoulder extensions green tband x30 Shoulder flexion x15     10/31/21  Nustep 4 mins x level 3  HR/TR 2x10   Chair squat 2x10  Side steps with leg lifts 10'x6  Educated and discussed walking program  LTR with Pbx30  PB DKTC x20  Modified deadbug/iso abdominal 2x10  Seated PB forward rolls-> then lateral rolls x20 each   All at SBA       Ab set x 5 Knee to chest x 3  B LE hip abduction x 5 Bridge x 5    PATIENT EDUCATION:  Education details: HEp  Person educated: Patient Education method: Explanation, Corporate treasurer cues, Verbal cues, and Handouts Education comprehension: verbalized understanding   HOME EXERCISE PROGRAM: Access Code: [email or web address] URL: [email or web address] Date: 11/09/2021 Prepared by: Leota Jacobsen  Exercises - Seated Hamstring Stretch (BKA)  - 1 x daily - 7 x weekly - 3 sets - 10 reps - Seated Piriformis Stretch with Trunk Bend  - 1 x daily - 7 x weekly - 3 sets - 10 reps - Supine 90/90 Abdominal Bracing  - 1 x daily - 7 x weekly - 3 sets - 10 reps - Hooklying Clamshell with Resistance  - 1 x daily - 7 x weekly - 3 sets - 10 reps   Ab set x 5 B LE hip abduction x 5 Bridge x 5              Knee to chest x 3   ASSESSMENT:  CLINICAL IMPRESSION: Patient tolerates session well. Updated HEP performed and given to patient without any questions. Patient  reports  back feels good performing seated PB forward rolls.  Patient reoprts she has not yet called to make appointment for vascular MD. Pt will continue to benefit from skilled PT to address issues of pain, rom and decreased endurance and maximize her functional ability   OBJECTIVE IMPAIRMENTS Abnormal gait, decreased activity tolerance, decreased balance, difficulty walking, decreased strength, postural dysfunction, and pain.   ACTIVITY LIMITATIONS cleaning, community activity, meal prep, laundry, yard work, shopping, and yard work.   PERSONAL FACTORS Fitness and Time since onset of injury/illness/exacerbation are also affecting patient's functional outcome.    REHAB POTENTIAL: Good  CLINICAL DECISION MAKING: Evolving/moderate complexity  EVALUATION COMPLEXITY: Moderate   GOALS: Goals reviewed with patient? No  SHORT TERM GOALS: Target date: 11/17/2021    Pt to be I with HEP in order for her pain to greater  no greater than a 5/10 to be able to walk for 15 minutes with confidence. Baseline: Goal status: INITIAL  2.  Pt mm strength to be increased 1/2 grade to allow pt to come sit to stand 12 x in 30"  Baseline:  Goal status: INITIAL  3.  Pt to be able to single leg stance for 10 second on each leg to reduce risk of falling  Baseline:  Goal status: INITIAL  LONG TERM GOALS: Target date: 12/08/2021    PT to be I in an advanced HEP in order to allow pt to state that she is able to walk for 25 minutes with confidence  Baseline:  Goal status: INITIAL  2.  Pt pain level to be no greater than a 3/10  Baseline:  Goal status: INITIAL  3.  PT to be walking without exhibiting a posterior lean  Baseline:  Goal status: INITIAL  4.  PT mm strength to be increased one grade to be able to go up and down  4steps in a reciprocal manner  Baseline:  Goal status: INITIAL     PLAN: PT FREQUENCY: 2x/week  PT DURATION: 6 weeks  PLANNED INTERVENTIONS: Therapeutic exercises,  Therapeutic activity, Neuromuscular re-education, Balance training, Gait training, and Patient/Family education  PLAN FOR NEXT SESSION: Begin incorporating Single leg stance balance  Continue to work on standing/exercise tolerance and incorporating back strength and mobility.    Melodye Swor PT, DPT  11/09/2021, 12:16 PM

## 2021-11-10 ENCOUNTER — Encounter: Payer: Self-pay | Admitting: Allergy & Immunology

## 2021-11-10 ENCOUNTER — Ambulatory Visit (HOSPITAL_COMMUNITY): Payer: Medicare HMO

## 2021-11-10 ENCOUNTER — Ambulatory Visit (INDEPENDENT_AMBULATORY_CARE_PROVIDER_SITE_OTHER): Payer: Medicare HMO | Admitting: Allergy & Immunology

## 2021-11-10 VITALS — BP 126/70 | HR 99 | Temp 98.4°F | Resp 18

## 2021-11-10 DIAGNOSIS — B349 Viral infection, unspecified: Secondary | ICD-10-CM | POA: Diagnosis not present

## 2021-11-10 DIAGNOSIS — T50905D Adverse effect of unspecified drugs, medicaments and biological substances, subsequent encounter: Secondary | ICD-10-CM

## 2021-11-10 NOTE — Patient Instructions (Addendum)
Viral syndrome - Your symptoms sound more consistent with a viral process.  - COVID testing was negative today. - This is not the typical reaction of Dupixent, so I do not think that it is related. - However, we will give you a prednisone pack to start just in case. - Take Tylenol over the weekend regularly to stay ahead of the pain.  - We have a doctor on call 24/7, just call the office to find out who is on call.   2. Follow up in 4 weeks or earlier if needed.    Please inform us of any Emergency Department visits, hospitalizations, or changes in symptoms. Call us before going to the ED for breathing or allergy symptoms since we might be able to fit you in for a sick visit. Feel free to contact us anytime with any questions, problems, or concerns.  It was a pleasure to see you again today!  Websites that have reliable patient information: 1. American Academy of Asthma, Allergy, and Immunology: www.aaaai.org 2. Food Allergy Research and Education (FARE): foodallergy.org 3. Mothers of Asthmatics: http://www.asthmacommunitynetwork.org 4. American College of Allergy, Asthma, and Immunology: www.acaai.org   COVID-19 Vaccine Information can be found at: ShippingScam.co.uk For questions related to vaccine distribution or appointments, please email [email or web address] or call [phone].   We realize that you might be concerned about having an allergic reaction to the COVID19 vaccines. To help with that concern, WE ARE OFFERING THE COVID19 VACCINES IN OUR OFFICE! Ask the front desk for dates!     "Like" [place] on Facebook and Instagram for our latest updates!      A healthy democracy works best when [employer] participate! Make sure you are registered to vote! If you have moved or changed any of your contact information, you will need to get this updated before voting!  In some cases, you MAY be able to register to vote online:  [email or web address]

## 2021-11-10 NOTE — Progress Notes (Signed)
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  - COVID testing was negative today. - This is not the typical reaction of Dupixent, so I do not think that it is related. - However, we will give you a prednisone pack to start just in case. - Take Tylenol over the weekend regularly to stay ahead of the pain.  - We have a doctor on call 24/7, just call the office to find out who is on call.   2. Follow up in 4 weeks or earlier if needed.   Subjective:   Emily Humphrey is a 71 y.o. female presenting today for follow up of  Chief Complaint  Patient presents with   Allergic Reaction    She concerned she is having an allergic reaction to the Dupixent injection she received on Monday 11/06/21. She received a loading dose Wednesday- Diarrhea, Thursday- Achy, Friday- lymph nodes swollen and soar throat.      Emily Humphrey has a history of the following: Patient Active Problem List   Diagnosis Date Noted   DOE (dyspnea on exertion) 02/17/2021   Cerebral vascular disease 09/06/2020   Mild cognitive impairment 09/06/2020   Abnormal finding on MRI of brain 05/10/2020   Gait abnormality 05/10/2020   Essential hypertension, benign 08/01/2015   Type 2 diabetes mellitus, uncontrolled (Emily Humphrey)    Hyperlipidemia    COPD (chronic obstructive pulmonary disease) ([facility])    History of tobacco abuse    Asthma    GERD (gastroesophageal reflux disease)    Morbid obesity ([facility])    Chest pain    Pericardial effusion    Ejection fraction    Pericarditis     History obtained from: chart review and patient.  [name] is a 71 y.o. female presenting  for a follow up visit.  She was last seen in March 2023 by Emily Humphrey.  At that time, we continue with breast tree 2 puffs twice daily which was prescribed by Dr. Work.  She also continued on prednisone 10 mg daily, which she has been on for a number of years.  We finally convinced her to start [place].  For her chronic rhinitis, we obtained some lab work.  We continue with loratadine as well as Atrovent.  In the interim, she started on Dupixent on May 22.  She received her injection at the Emily Humphrey office.  Since last visit, she has had a hard time. She had bad diarrhea on Tuesday. She has this sometimes anyway and did not think anything about it. Wednesday was a good day. Then on Thursday she was going to physical therapy for management of her arthritis in her legs and her back. She did not feel great during the physical therapy. This is her third week of physical therapy. She felt dizzy.   She had some plans after her PT and she canceled them and went home and laid down. Last night, she felt achey and she woke up and could hardly swallow. She also reports that her glands are swollen and she felt bad. She was supposed to go to therapy and she called and she was not going to come.  She has not had rash or itching with this at all.   She has had no fever. Her daughter works in [employer] and she came home early to take care of her. She felt very bad when she first called. She is now feeling better. She HAS not tested for COVID19. She has been having some tongue pain. But her teeth which is a bridge, fell out. Where the bridge was, there are two sharp pieces of the tooth sticking down and irritating her tongue.   She remains motivated to get off of her prednisone. She wants the Dupxient to work.  Otherwise, there have been no changes to her past medical history, surgical history, family history, or social history.    Review of Systems  Constitutional:  Positive for chills and malaise/fatigue.  Negative for fever and weight loss.  HENT:  Positive for congestion. Negative for ear discharge and ear pain.   Eyes:  Negative for pain, discharge and redness.  Respiratory:  Positive for shortness of breath. Negative for cough, sputum production and wheezing.   Cardiovascular: Negative.  Negative for chest pain and palpitations.  Gastrointestinal:  Positive for diarrhea. Negative for abdominal pain, heartburn, nausea and vomiting.  Skin: Negative.  Negative for itching and rash.  Neurological:  Positive for dizziness. Negative for headaches.  Endo/Heme/Allergies:  Positive for environmental allergies. Does not bruise/bleed easily.      Objective:   Blood pressure 126/70, pulse 99, temperature 98.4 F (36.9 C), temperature source Temporal, resp. rate 18, SpO2 95 %. There is no height or weight on file to calculate BMI.    Physical Exam Vitals reviewed.  Constitutional:      Appearance: She is well-developed. She is obese.     Comments: Very pleasant.  Cooperative with the exam.  HENT:     Head: Normocephalic and atraumatic.     Right Ear: Tympanic membrane, ear canal and external ear normal. No drainage, swelling or tenderness. Tympanic membrane is not injected, scarred, erythematous, retracted or bulging.     Left Ear: Tympanic membrane, ear canal and external ear normal. No drainage, swelling or tenderness. Tympanic membrane is not injected, scarred, erythematous, retracted or bulging.     Nose: No nasal deformity, septal deviation, mucosal edema or rhinorrhea.     Right Turbinates: Enlarged, swollen and pale.     Left Turbinates: Enlarged, swollen and pale.     Right Sinus: No maxillary sinus tenderness or frontal sinus tenderness.     Left Sinus: No maxillary sinus tenderness or frontal sinus tenderness.     Comments: Bilateral nasal polyposis with approximately 75% obstruction bilaterally.    Mouth/Throat:     Mouth: Mucous membranes are not pale and not dry.     Pharynx:  Uvula midline.     Comments: Cobblestoning in the posterior oropharynx. Eyes:     General: Lids are normal. Allergic shiner present.        Right eye: No discharge.        Left eye: No discharge.     Conjunctiva/sclera: Conjunctivae normal.     Right eye: Right conjunctiva is not injected. No chemosis.    Left eye: Left conjunctiva is not injected. No chemosis.    Pupils: Pupils are equal, round, and reactive to light.  Cardiovascular:     Rate and Rhythm: Normal rate and regular rhythm.     Heart sounds: Normal heart sounds.  Pulmonary:     Effort: Pulmonary effort is normal. No tachypnea, accessory muscle  usage or respiratory distress.     Breath sounds: Normal breath sounds. No wheezing, rhonchi or rales.     Comments: Moving air well in all lung fields.  No increased work of breathing. Chest:     Chest wall: No tenderness.  Abdominal:     Tenderness: There is no abdominal tenderness. There is no guarding or rebound.  Lymphadenopathy:     Head:     Right side of head: No submandibular, tonsillar or occipital adenopathy.     Left side of head: No submandibular, tonsillar or occipital adenopathy.     Cervical: Cervical adenopathy present.  Skin:    General: Skin is warm.     Capillary Refill: Capillary refill takes less than 2 seconds.     Coloration: Skin is not pale.     Findings: No abrasion, erythema, petechiae or rash. Rash is not papular, urticarial or vesicular.     Comments: No eczematous or urticarial lesions noted.  Neurological:     Mental Status: She is alert.  Psychiatric:        Behavior: Behavior is cooperative.     Diagnostic studies:   COVID Rapid testing: negative with adequate control     Salvatore Marvel, MD  [facility] [place] [facility] [place]

## 2021-11-15 ENCOUNTER — Encounter (HOSPITAL_COMMUNITY): Payer: Self-pay | Admitting: Physical Therapy

## 2021-11-15 ENCOUNTER — Ambulatory Visit (HOSPITAL_COMMUNITY): Payer: Medicare HMO | Attending: Family Medicine | Admitting: Physical Therapy

## 2021-11-15 DIAGNOSIS — M5459 Other low back pain: Secondary | ICD-10-CM

## 2021-11-15 DIAGNOSIS — M79604 Pain in right leg: Secondary | ICD-10-CM

## 2021-11-15 DIAGNOSIS — M79605 Pain in left leg: Secondary | ICD-10-CM | POA: Diagnosis not present

## 2021-11-15 DIAGNOSIS — M6281 Muscle weakness (generalized): Secondary | ICD-10-CM

## 2021-11-15 DIAGNOSIS — R262 Difficulty in walking, not elsewhere classified: Secondary | ICD-10-CM | POA: Diagnosis not present

## 2021-11-15 DIAGNOSIS — E1165 Type 2 diabetes mellitus with hyperglycemia: Secondary | ICD-10-CM | POA: Diagnosis not present

## 2021-11-15 NOTE — Therapy (Signed)
OUTPATIENT PHYSICAL THERAPY LOWER EXTREMITY EVALUATION   Patient Name: Emily Humphrey MRN: [id number] DOB:01/13/1951, 71 y.o., female Today's Date: 11/15/2021   PT End of Session - 11/15/21 1048     Visit Number 4    Number of Visits 12    Date for PT Re-Evaluation 12/08/21    Authorization Type [employer]    Authorization Time Period 12 visits approved 10/26/21 - 6/23    Authorization - Visit Number 4    Authorization - Number of Visits 12    Progress Note Due on Visit 10    PT Start Time 1607    PT Stop Time 3710    PT Time Calculation (min) 38 min    Activity Tolerance Patient tolerated treatment well    Behavior During Therapy [facility] for tasks assessed/performed             Past Medical History:  Diagnosis Date   Abnormal gait    Asthma    Chest pain    Hospital, May, 2012,  Pericarditis   COPD (chronic obstructive pulmonary disease) ([place])    Chronic steroid use   Dyslipidemia    Ejection fraction    Normal, echo, [id number]   GERD (gastroesophageal reflux disease)    History of tobacco abuse    IDDM (insulin dependent diabetes mellitus)    Morbid obesity ([place])    OSA (obstructive sleep apnea)    mild/not using C-PAP    Pericardial effusion    Small, echo, circumferential, May, 2012   Pericarditis    Hospitalization, May, 2012   Pneumonia 2010   Tremor    Past Surgical History:  Procedure Laterality Date   ABDOMINAL HYSTERECTOMY     NASAL SINUS SURGERY     Removal of throat nodules     vocal cored nodules   Patient Active Problem List   Diagnosis Date Noted   DOE (dyspnea on exertion) 02/17/2021   Cerebral vascular disease 09/06/2020   Mild cognitive impairment 09/06/2020   Abnormal finding on MRI of brain 05/10/2020   Gait abnormality 05/10/2020   Essential hypertension, benign 08/01/2015   Type 2 diabetes mellitus, uncontrolled ([place])    Hyperlipidemia    COPD (chronic obstructive pulmonary disease) ([place])    History of tobacco abuse    Asthma    GERD  (gastroesophageal reflux disease)    Morbid obesity ([place])    Chest pain    Pericardial effusion    Ejection fraction    Pericarditis     PCP:  Lanelle Bal  REFERRING PROVIDER: Vallarie Mare, MD  REFERRING DIAG: B LE claudication   THERAPY DIAG:  Pain in right leg  Pain in left leg  Other low back pain  Muscle weakness (generalized)  Difficulty in walking, not elsewhere classified  ONSET DATE:  Pt states her back has been bothering her for a year.  Her leg pain has been going on for about six months.    SUBJECTIVE:   SUBJECTIVE STATEMENT:   Patient states things haven't been going great. Wasn't able to come last week due to not feeling well. Has appointment with vascular MD.   PERTINENT HISTORY: Gait abnormality, DM, COPD  PAIN:  PAIN:  Are you having pain? Yes: NPRS scale: 2/10 Pain location: back Pain description: dull, ache, cramping Aggravating factors: walking Relieving factors: sitting    PRECAUTIONS: Fall  WEIGHT BEARING RESTRICTIONS No  FALLS:  Has patient fallen in last 6 months? Yes. Number of falls-2  LIVING ENVIRONMENT: Lives with:  lives alone Lives in: House/apartment Stairs: No Has following equipment at home: has a cane but does not use   OCCUPATION:  retired  PLOF: Independent  PATIENT GOALS  To be able to get out and walk in the malls and shopping and have less pain.   To be able to work in her flower garden.    OBJECTIVE:   COGNITION:  Overall cognitive status: Within functional limits for tasks assessed     SENSATION: Not tested  POSTURE:  Pt has increased Kyphosis, decrease lordosis Back ROM:                  Flex:                  Ext: Functional reps cause no sx changes                   LE MMT:  MMT Right 10/31/2021 Left 10/31/2021  Hip flexion 3/5 3/5  Hip extension 3/5 3/5  Hip abduction 3/5 3+/5  Hip adduction    Hip internal rotation    Hip external rotation    Knee flexion 3+/5 3+/5  Knee  extension 5/5 5/5  Ankle dorsiflexion 4+/5 4/5  Ankle plantarflexion    Ankle inversion    Ankle eversion     (Blank rows = not tested)  FUNCTIONAL TESTS:  30 seconds chair stand test:  10 in 30 seconds below average for age.   Single leg stance: Lt 4 sec, RT 1 sec  2 minute walking test: 342 ft.  Pt walks with posterior lean, flat footed     TODAY'S TREATMENT: 11/15/21 Nustep level 3 x5 mins Trunk flexion stretch with large blue ball 3 way- 5x 10 second holds each STS 2x 10 without UE support Standing Alternating march 2x 20 bilateral  Standing Hip abduction 2x 10 bilateral     11/09/21  Nustep level 3 x5 mins  PB seated forward rolls x15  Seated hamstring stretch 10 sec hold x5 b/l   Seated piriformis stretch Stair stretch x5 with 5 sec holds performed b/l DKTC with ppt for low ab engagement 3x10 Clamshell red tband  hooklying Shoulder extensions green tband x30 Shoulder flexion x15     10/31/21  Nustep 4 mins x level 3  HR/TR 2x10   Chair squat 2x10  Side steps with leg lifts 10'x6  Educated and discussed walking program  LTR with Pbx30  PB DKTC x20  Modified deadbug/iso abdominal 2x10  Seated PB forward rolls-> then lateral rolls x20 each   All at SBA       Ab set x 5 Knee to chest x 3  B LE hip abduction x 5 Bridge x 5    PATIENT EDUCATION:  Education details: HEp  Person educated: Patient Education method: Explanation, Corporate treasurer cues, Verbal cues, and Handouts Education comprehension: verbalized understanding   HOME EXERCISE PROGRAM: 11/15/21- Sit to Stand with Arms Crossed  - 1 x daily - 7 x weekly - 3 sets - 10 reps  Access Code: [email or web address] URL: [email or web address] Date: 11/09/2021 Prepared by: Leota Jacobsen  Exercises - Seated Hamstring Stretch (BKA)  - 1 x daily - 7 x weekly - 3 sets - 10 reps - Seated Piriformis Stretch with Trunk Bend  - 1 x daily - 7 x weekly - 3 sets - 10 reps - Supine 90/90 Abdominal Bracing  - 1 x  daily - 7 x weekly - 3 sets - 10 reps - Hooklying  Clamshell with Resistance  - 1 x daily - 7 x weekly - 3 sets - 10 reps   Ab set x 5 B LE hip abduction x 5 Bridge x 5              Knee to chest x 3   ASSESSMENT:  CLINICAL IMPRESSION: Began session with nu step for dynamic warm up and conditioning. Continued with spinal mobility and core strengthening exercises which are tolerated well. She requires intermittent short rest breaks for fatigue/SOB. Patient given intermittent cueing for slow, controlled exercises. Patient will continue to benefit from physical therapy in order to improve function and reduce impairment.    OBJECTIVE IMPAIRMENTS Abnormal gait, decreased activity tolerance, decreased balance, difficulty walking, decreased strength, postural dysfunction, and pain.   ACTIVITY LIMITATIONS cleaning, community activity, meal prep, laundry, yard work, shopping, and yard work.   PERSONAL FACTORS Fitness and Time since onset of injury/illness/exacerbation are also affecting patient's functional outcome.    REHAB POTENTIAL: Good  CLINICAL DECISION MAKING: Evolving/moderate complexity  EVALUATION COMPLEXITY: Moderate   GOALS: Goals reviewed with patient? No  SHORT TERM GOALS: Target date: 11/17/2021    Pt to be I with HEP in order for her pain to greater  no greater than a 5/10 to be able to walk for 15 minutes with confidence. Baseline: Goal status: IN PROGRESS  2.  Pt mm strength to be increased 1/2 grade to allow pt to come sit to stand 12 x in 30"  Baseline:  Goal status: IN PROGRESS  3.  Pt to be able to single leg stance for 10 second on each leg to reduce risk of falling  Baseline:  Goal status: IN PROGRESS  LONG TERM GOALS: Target date: 12/08/2021    PT to be I in an advanced HEP in order to allow pt to state that she is able to walk for 25 minutes with confidence  Baseline:  Goal status: IN PROGRESS  2.  Pt pain level to be no greater than a 3/10   Baseline:  Goal status: IN PROGRESS  3.  PT to be walking without exhibiting a posterior lean  Baseline:  Goal status: IN PROGRESS  4.  PT mm strength to be increased one grade to be able to go up and down  4steps in a reciprocal manner  Baseline:  Goal status: IN PROGRESS     PLAN: PT FREQUENCY: 2x/week  PT DURATION: 6 weeks  PLANNED INTERVENTIONS: Therapeutic exercises, Therapeutic activity, Neuromuscular re-education, Balance training, Gait training, and Patient/Family education  PLAN FOR NEXT SESSION: Begin incorporating Single leg stance balance  Continue to work on standing/exercise tolerance and incorporating back strength and mobility.    11:25 AM, 11/15/21 Mearl Latin PT, DPT Physical Therapist at [facility]

## 2021-11-16 DIAGNOSIS — Z1231 Encounter for screening mammogram for malignant neoplasm of breast: Secondary | ICD-10-CM | POA: Diagnosis not present

## 2021-11-17 ENCOUNTER — Encounter (HOSPITAL_COMMUNITY): Payer: Self-pay

## 2021-11-17 ENCOUNTER — Ambulatory Visit (HOSPITAL_COMMUNITY): Payer: Medicare HMO | Attending: Family Medicine

## 2021-11-17 DIAGNOSIS — M79605 Pain in left leg: Secondary | ICD-10-CM | POA: Diagnosis not present

## 2021-11-17 DIAGNOSIS — I739 Peripheral vascular disease, unspecified: Secondary | ICD-10-CM | POA: Diagnosis not present

## 2021-11-17 DIAGNOSIS — M79604 Pain in right leg: Secondary | ICD-10-CM | POA: Insufficient documentation

## 2021-11-17 DIAGNOSIS — M549 Dorsalgia, unspecified: Secondary | ICD-10-CM | POA: Insufficient documentation

## 2021-11-17 DIAGNOSIS — M5459 Other low back pain: Secondary | ICD-10-CM

## 2021-11-17 DIAGNOSIS — M6281 Muscle weakness (generalized): Secondary | ICD-10-CM | POA: Insufficient documentation

## 2021-11-17 DIAGNOSIS — R262 Difficulty in walking, not elsewhere classified: Secondary | ICD-10-CM

## 2021-11-17 NOTE — Therapy (Signed)
OUTPATIENT PHYSICAL THERAPY LOWER EXTREMITY EVALUATION   Patient Name: Emily Humphrey MRN: [id number] DOB:1950/12/06, 71 y.o., female Today's Date: 11/17/2021   PT End of Session - 11/17/21 1430     Visit Number 5    Number of Visits 12    Date for PT Re-Evaluation 12/08/21    Authorization Type [employer]    Authorization Time Period 12 visits approved 10/26/21 - 6/23    Authorization - Visit Number 5    Authorization - Number of Visits 12    Progress Note Due on Visit 10    PT Start Time 1430    PT Stop Time 1510    PT Time Calculation (min) 40 min    Activity Tolerance Patient tolerated treatment well    Behavior During Therapy [facility] for tasks assessed/performed             Past Medical History:  Diagnosis Date   Abnormal gait    Asthma    Chest pain    Hospital, May, 2012,  Pericarditis   COPD (chronic obstructive pulmonary disease) ([place])    Chronic steroid use   Dyslipidemia    Ejection fraction    Normal, echo, [id number]   GERD (gastroesophageal reflux disease)    History of tobacco abuse    IDDM (insulin dependent diabetes mellitus)    Morbid obesity ([facility])    OSA (obstructive sleep apnea)    mild/not using C-PAP    Pericardial effusion    Small, echo, circumferential, May, 2012   Pericarditis    Hospitalization, May, 2012   Pneumonia 2010   Tremor    Past Surgical History:  Procedure Laterality Date   ABDOMINAL HYSTERECTOMY     NASAL SINUS SURGERY     Removal of throat nodules     vocal cored nodules   Patient Active Problem List   Diagnosis Date Noted   DOE (dyspnea on exertion) 02/17/2021   Cerebral vascular disease 09/06/2020   Mild cognitive impairment 09/06/2020   Abnormal finding on MRI of brain 05/10/2020   Gait abnormality 05/10/2020   Essential hypertension, benign 08/01/2015   Type 2 diabetes mellitus, uncontrolled ([place])    Hyperlipidemia    COPD (chronic obstructive pulmonary disease) ([place])    History of tobacco abuse    Asthma    GERD  (gastroesophageal reflux disease)    Morbid obesity ([place])    Chest pain    Pericardial effusion    Ejection fraction    Pericarditis     PCP:  Lanelle Bal  REFERRING PROVIDER: Vallarie Mare, MD  REFERRING DIAG: B LE claudication   THERAPY DIAG:  Pain in right leg  Pain in left leg  Other low back pain  Difficulty in walking, not elsewhere classified  Muscle weakness (generalized)  ONSET DATE:  Pt states her back has been bothering her for a year.  Her leg pain has been going on for about six months.    SUBJECTIVE:   SUBJECTIVE STATEMENT:   Patient reports that she has been getting injections as her asthma has been bad.pt reports that she feels her legs hurting but continues to try and keep herself moving.    PERTINENT HISTORY: Gait abnormality, DM, COPD  PAIN:  PAIN:  Are you having pain? Yes: NPRS scale: 4/10 Pain location: back Pain description: dull, ache, cramping Aggravating factors: walking Relieving factors: sitting    PRECAUTIONS: Fall  WEIGHT BEARING RESTRICTIONS No  FALLS:  Has patient fallen in last 6 months? Yes. Number  of falls-2  LIVING ENVIRONMENT: Lives with: lives alone Lives in: House/apartment Stairs: No Has following equipment at home: has a cane but does not use   OCCUPATION:  retired  PLOF: Independent  PATIENT GOALS  To be able to get out and walk in the malls and shopping and have less pain.   To be able to work in her flower garden.    OBJECTIVE:   COGNITION:  Overall cognitive status: Within functional limits for tasks assessed     SENSATION: Not tested  POSTURE:  Pt has increased Kyphosis, decrease lordosis Back ROM:                  Flex:                  Ext: Functional reps cause no sx changes                   LE MMT:  MMT Right 10/31/2021 Left 10/31/2021  Hip flexion 3/5 3/5  Hip extension 3/5 3/5  Hip abduction 3/5 3+/5  Hip adduction    Hip internal rotation    Hip external rotation    Knee  flexion 3+/5 3+/5  Knee extension 5/5 5/5  Ankle dorsiflexion 4+/5 4/5  Ankle plantarflexion    Ankle inversion    Ankle eversion     (Blank rows = not tested)  FUNCTIONAL TESTS:  30 seconds chair stand test:  10 in 30 seconds below average for age.   Single leg stance: Lt 4 sec, RT 1 sec  2 minute walking test: 342 ft.  Pt walks with posterior lean, flat footed     TODAY'S TREATMENT:   11/17/21   Nustep level 4 x5 mins   Manual stretch of hamstrings, glutes, hip adductors, hip flexor, quad , b/l   Seated piriformis stretch b/l  Seated physioball forward roles 5 sec holds x10 rolls Lateral physioball rolls lateral x10 b/l Standing shoulder extensions blue Standing rows blue Lifts and rotation with red tband x10 b/l Resisted cable column walkout at hips x3 plates anterior and retro Quadruped cat/camel x4 Standing manual cueing apt/ppt x5       11/15/21 Nustep level 3 x5 mins Trunk flexion stretch with large blue ball 3 way- 5x 10 second holds each STS 2x 10 without UE support Standing Alternating march 2x 20 bilateral  Standing Hip abduction 2x 10 bilateral     11/09/21  Nustep level 3 x5 mins  PB seated forward rolls x15  Seated hamstring stretch 10 sec hold x5 b/l   Seated piriformis stretch Stair stretch x5 with 5 sec holds performed b/l DKTC with ppt for low ab engagement 3x10 Clamshell red tband  hooklying Shoulder extensions green tband x30 Shoulder flexion x15     10/31/21  Nustep 4 mins x level 3  HR/TR 2x10   Chair squat 2x10  Side steps with leg lifts 10'x6  Educated and discussed walking program  LTR with Pbx30  PB DKTC x20  Modified deadbug/iso abdominal 2x10  Seated PB forward rolls-> then lateral rolls x20 each   All at SBA       Ab set x 5 Knee to chest x 3  B LE hip abduction x 5 Bridge x 5    PATIENT EDUCATION:  Education details: HEp  Person educated: Patient Education method: Explanation, Corporate treasurer cues, Verbal cues, and  Handouts Education comprehension: verbalized understanding   HOME EXERCISE PROGRAM: 11/15/21- Sit to Stand with Arms Crossed  -  1 x daily - 7 x weekly - 3 sets - 10 reps  Access Code: [email or web address] URL: [email or web address] Date: 11/09/2021 Prepared by: Leota Jacobsen  Exercises - Seated Hamstring Stretch (BKA)  - 1 x daily - 7 x weekly - 3 sets - 10 reps - Seated Piriformis Stretch with Trunk Bend  - 1 x daily - 7 x weekly - 3 sets - 10 reps - Supine 90/90 Abdominal Bracing  - 1 x daily - 7 x weekly - 3 sets - 10 reps - Hooklying Clamshell with Resistance  - 1 x daily - 7 x weekly - 3 sets - 10 reps   Ab set x 5 B LE hip abduction x 5 Bridge x 5              Knee to chest x 3   ASSESSMENT:  CLINICAL IMPRESSION: Patient reports that stretching her legs and back helped eleviate the pain she was experiencing. Patient only able to tolerate quadruped cat/camel x4 reps secondary to UE weakness. Patient demos better pelvic activation and is able to get standing anterior pelvic tilt and posterior pelvic tilt with manual cueing, this motion is tough for patient to continue on her own but states it helps relieve the pressure in her back. On retro walkouts patient with difficulty returning back into cable column, pt with poor core activation and loses control of hip extensors. Patient will continue to benefit from physical therapy in order to improve function and reduce impairment.    OBJECTIVE IMPAIRMENTS Abnormal gait, decreased activity tolerance, decreased balance, difficulty walking, decreased strength, postural dysfunction, and pain.   ACTIVITY LIMITATIONS cleaning, community activity, meal prep, laundry, yard work, shopping, and yard work.   PERSONAL FACTORS Fitness and Time since onset of injury/illness/exacerbation are also affecting patient's functional outcome.    REHAB POTENTIAL: Good  CLINICAL DECISION MAKING: Evolving/moderate complexity  EVALUATION COMPLEXITY:  Moderate   GOALS: Goals reviewed with patient? No  SHORT TERM GOALS: Target date: 11/17/2021    Pt to be I with HEP in order for her pain to greater  no greater than a 5/10 to be able to walk for 15 minutes with confidence. Baseline: Goal status: IN PROGRESS  2.  Pt mm strength to be increased 1/2 grade to allow pt to come sit to stand 12 x in 30"  Baseline:  Goal status: IN PROGRESS  3.  Pt to be able to single leg stance for 10 second on each leg to reduce risk of falling  Baseline:  Goal status: IN PROGRESS  LONG TERM GOALS: Target date: 12/08/2021    PT to be I in an advanced HEP in order to allow pt to state that she is able to walk for 25 minutes with confidence  Baseline:  Goal status: IN PROGRESS  2.  Pt pain level to be no greater than a 3/10  Baseline:  Goal status: IN PROGRESS  3.  PT to be walking without exhibiting a posterior lean  Baseline:  Goal status: IN PROGRESS  4.  PT mm strength to be increased one grade to be able to go up and down  4steps in a reciprocal manner  Baseline:  Goal status: IN PROGRESS     PLAN: PT FREQUENCY: 2x/week  PT DURATION: 6 weeks  PLANNED INTERVENTIONS: Therapeutic exercises, Therapeutic activity, Neuromuscular re-education, Balance training, Gait training, and Patient/Family education  PLAN FOR NEXT SESSION: Begin incorporating Single leg stance balance  Continue to work on standing/exercise tolerance and incorporating  back strength and mobility.    4:03 PM, 11/17/21 Vihaan Gloss PT, DPT

## 2021-11-20 ENCOUNTER — Encounter (HOSPITAL_COMMUNITY): Payer: Self-pay

## 2021-11-20 ENCOUNTER — Ambulatory Visit (HOSPITAL_COMMUNITY): Payer: Medicare HMO | Attending: Neurosurgery

## 2021-11-20 DIAGNOSIS — M79604 Pain in right leg: Secondary | ICD-10-CM | POA: Diagnosis not present

## 2021-11-20 DIAGNOSIS — M6281 Muscle weakness (generalized): Secondary | ICD-10-CM | POA: Diagnosis not present

## 2021-11-20 DIAGNOSIS — M79605 Pain in left leg: Secondary | ICD-10-CM | POA: Insufficient documentation

## 2021-11-20 DIAGNOSIS — R262 Difficulty in walking, not elsewhere classified: Secondary | ICD-10-CM | POA: Diagnosis not present

## 2021-11-20 DIAGNOSIS — M5459 Other low back pain: Secondary | ICD-10-CM | POA: Diagnosis not present

## 2021-11-20 NOTE — Therapy (Signed)
OUTPATIENT PHYSICAL THERAPY LOWER EXTREMITY EVALUATION   Patient Name: Emily Humphrey MRN: [id number] DOB:10-Feb-1951, 71 y.o., female Today's Date: 11/20/2021   PT End of Session - 11/20/21 0902     Visit Number 6    Number of Visits 12    Date for PT Re-Evaluation 12/08/21    Authorization Type [employer]    Authorization Time Period 12 visits approved 10/26/21 - 6/23    Authorization - Visit Number 6    Authorization - Number of Visits 12    Progress Note Due on Visit 10    PT Start Time 0902    PT Stop Time 0940    PT Time Calculation (min) 38 min    Activity Tolerance Patient tolerated treatment well    Behavior During Therapy [facility] for tasks assessed/performed             Past Medical History:  Diagnosis Date   Abnormal gait    Asthma    Chest pain    Hospital, May, 2012,  Pericarditis   COPD (chronic obstructive pulmonary disease) ([place])    Chronic steroid use   Dyslipidemia    Ejection fraction    Normal, echo, DJT,7017   GERD (gastroesophageal reflux disease)    History of tobacco abuse    IDDM (insulin dependent diabetes mellitus)    Morbid obesity (HCC)    OSA (obstructive sleep apnea)    mild/not using C-PAP    Pericardial effusion    Small, echo, circumferential, May, 2012   Pericarditis    Hospitalization, May, 2012   Pneumonia 2010   Tremor    Past Surgical History:  Procedure Laterality Date   ABDOMINAL HYSTERECTOMY     NASAL SINUS SURGERY     Removal of throat nodules     vocal cored nodules   Patient Active Problem List   Diagnosis Date Noted   DOE (dyspnea on exertion) 02/17/2021   Cerebral vascular disease 09/06/2020   Mild cognitive impairment 09/06/2020   Abnormal finding on MRI of brain 05/10/2020   Gait abnormality 05/10/2020   Essential hypertension, benign 08/01/2015   Type 2 diabetes mellitus, uncontrolled ([place])    Hyperlipidemia    COPD (chronic obstructive pulmonary disease) ([place])    History of tobacco abuse    Asthma    GERD  (gastroesophageal reflux disease)    Morbid obesity ([place])    Chest pain    Pericardial effusion    Ejection fraction    Pericarditis     PCP:  Lanelle Bal  REFERRING PROVIDER: Vallarie Mare, MD  REFERRING DIAG: B LE claudication   THERAPY DIAG:  Pain in right leg  Pain in left leg  Other low back pain  Difficulty in walking, not elsewhere classified  Muscle weakness (generalized)  ONSET DATE:  Pt states her back has been bothering her for a year.  Her leg pain has been going on for about six months.    SUBJECTIVE:   SUBJECTIVE STATEMENT:   Patient reports that she  woke up in more pain this morning. Entire body aching, reports she had to take pain medication. Reports that she feels the pain came on after doing her HEP.    PERTINENT HISTORY: Gait abnormality, DM, COPD  PAIN:  PAIN:  Are you having pain? Yes: NPRS scale: 4/10 Pain location: back Pain description: dull, ache, cramping Aggravating factors: walking Relieving factors: sitting    PRECAUTIONS: Fall  WEIGHT BEARING RESTRICTIONS No  FALLS:  Has patient fallen in  last 6 months? Yes. Number of falls-2  LIVING ENVIRONMENT: Lives with: lives alone Lives in: House/apartment Stairs: No Has following equipment at home: has a cane but does not use   OCCUPATION:  retired  PLOF: Independent  PATIENT GOALS  To be able to get out and walk in the malls and shopping and have less pain.   To be able to work in her flower garden.    OBJECTIVE:   COGNITION:  Overall cognitive status: Within functional limits for tasks assessed     SENSATION: Not tested  POSTURE:  Pt has increased Kyphosis, decrease lordosis Back ROM:                  Flex:                  Ext: Functional reps cause no sx changes                   LE MMT:  MMT Right 10/31/2021 Left 10/31/2021  Hip flexion 3/5 3/5  Hip extension 3/5 3/5  Hip abduction 3/5 3+/5  Hip adduction    Hip internal rotation    Hip external  rotation    Knee flexion 3+/5 3+/5  Knee extension 5/5 5/5  Ankle dorsiflexion 4+/5 4/5  Ankle plantarflexion    Ankle inversion    Ankle eversion     (Blank rows = not tested)  FUNCTIONAL TESTS:  30 seconds chair stand test:  10 in 30 seconds below average for age.   Single leg stance: Lt 4 sec, RT 1 sec  2 minute walking test: 342 ft.  Pt walks with posterior lean, flat footed     TODAY'S TREATMENT:  11/20/21 Nustep level 4 x5 mins Review of HEP including Seated hamstring stretch Seated piriformis stretch Sit to stands with verbal cueing for knee control Ppt/ ab set with manual cueing to try increase abdominal activation Seated physioball forward roles 10 sec holds x 10 reps Seated physioball lateral roles 10 sec holds x 10 reps Pallof press red theraband x10 b/l Seated UE press with red theraband under feet x10 Seated march with 3 second hold, alternating x20    11/17/21   Nustep level 4 x5 mins   Manual stretch of hamstrings, glutes, hip adductors, hip flexor, quad , b/l   Seated piriformis stretch b/l  Seated physioball forward roles 5 sec holds x10 rolls Lateral physioball rolls lateral x10 b/l Standing shoulder extensions blue Standing rows blue Lifts and rotation with red tband x10 b/l Resisted cable column walkout at hips x3 plates anterior and retro Quadruped cat/camel x4 Standing manual cueing apt/ppt x5       11/15/21 Nustep level 3 x5 mins Trunk flexion stretch with large blue ball 3 way- 5x 10 second holds each STS 2x 10 without UE support Standing Alternating march 2x 20 bilateral  Standing Hip abduction 2x 10 bilateral     11/09/21  Nustep level 3 x5 mins  PB seated forward rolls x15  Seated hamstring stretch 10 sec hold x5 b/l   Seated piriformis stretch Stair stretch x5 with 5 sec holds performed b/l DKTC with ppt for low ab engagement 3x10 Clamshell red tband  hooklying Shoulder extensions green tband x30 Shoulder flexion x15      10/31/21  Nustep 4 mins x level 3  HR/TR 2x10   Chair squat 2x10  Side steps with leg lifts 10'x6  Educated and discussed walking program  LTR with Pbx30  PB DKTC x20  Modified deadbug/iso abdominal 2x10  Seated PB forward rolls-> then lateral rolls x20 each   All at SBA       Ab set x 5 Knee to chest x 3  B LE hip abduction x 5 Bridge x 5    PATIENT EDUCATION:  Education details: HEp updated 11/20/21 Person educated: Patient Education method: Explanation, Tactile cues, Verbal cues, and Handouts Education comprehension: verbalized understanding   HOME EXERCISE PROGRAM: Access Code: [email or web address] URL: [email or web address] Date: 11/20/2021 Prepared by: Leota Jacobsen  Exercises - Seated Hip Flexion March with Ankle Weights  - 1 x daily - 7 x weekly - 3 sets - 10 reps  11/15/21- Sit to Stand with Arms Crossed  - 1 x daily - 7 x weekly - 3 sets - 10 reps  Access Code: [email or web address] URL: [email or web address] Date: 11/09/2021 Prepared by: Leota Jacobsen  Exercises - Seated Hamstring Stretch (BKA)  - 1 x daily - 7 x weekly - 3 sets - 10 reps - Seated Piriformis Stretch with Trunk Bend  - 1 x daily - 7 x weekly - 3 sets - 10 reps - Supine 90/90 Abdominal Bracing  - 1 x daily - 7 x weekly - 3 sets - 10 reps - Hooklying Clamshell with Resistance  - 1 x daily - 7 x weekly - 3 sets - 10 reps   Ab set x 5 B LE hip abduction x 5 Bridge x 5              Knee to chest x 3   ASSESSMENT:  CLINICAL IMPRESSION: Patient with increased left side mid/low back pain. Patient with great difficulty activating core and performing pelvic tilts. Thus making ab set, bridge and exercises with knee to chest more difficult to perform. Modified patients way of doing hooklying clamshells to more isometric at smaller rom. Pt more aware of hip/knee valgus on sit to stands and understands how to correct. Patient often reverts to holding breath and throwing self into  lumber lordosis when activating hip extensors. Added in core strength through use of Ues today as pt had difficulty creating stabilization laying down. Patient will continue to benefit from physical therapy in order to improve function and reduce impairment.    OBJECTIVE IMPAIRMENTS Abnormal gait, decreased activity tolerance, decreased balance, difficulty walking, decreased strength, postural dysfunction, and pain.   ACTIVITY LIMITATIONS cleaning, community activity, meal prep, laundry, yard work, shopping, and yard work.   PERSONAL FACTORS Fitness and Time since onset of injury/illness/exacerbation are also affecting patient's functional outcome.    REHAB POTENTIAL: Good  CLINICAL DECISION MAKING: Evolving/moderate complexity  EVALUATION COMPLEXITY: Moderate   GOALS: Goals reviewed with patient? No  SHORT TERM GOALS: Target date: 11/17/2021    Pt to be I with HEP in order for her pain to greater  no greater than a 5/10 to be able to walk for 15 minutes with confidence. Baseline: Goal status: IN PROGRESS  2.  Pt mm strength to be increased 1/2 grade to allow pt to come sit to stand 12 x in 30"  Baseline:  Goal status: IN PROGRESS  3.  Pt to be able to single leg stance for 10 second on each leg to reduce risk of falling  Baseline:  Goal status: IN PROGRESS  LONG TERM GOALS: Target date: 12/08/2021    PT to be I in an advanced HEP in order to allow pt to state that she is able to walk for 25 minutes with  confidence  Baseline:  Goal status: IN PROGRESS  2.  Pt pain level to be no greater than a 3/10  Baseline:  Goal status: IN PROGRESS  3.  PT to be walking without exhibiting a posterior lean  Baseline:  Goal status: IN PROGRESS  4.  PT mm strength to be increased one grade to be able to go up and down  4steps in a reciprocal manner  Baseline:  Goal status: IN PROGRESS     PLAN: PT FREQUENCY: 2x/week  PT DURATION: 6 weeks  PLANNED INTERVENTIONS: Therapeutic  exercises, Therapeutic activity, Neuromuscular re-education, Balance training, Gait training, and Patient/Family education  PLAN FOR NEXT SESSION: continue to drive increased core activation and pelvic stabilization with mobility.   9:52 AM, 11/20/21 Iyana Topor PT, DPT

## 2021-11-24 ENCOUNTER — Encounter (HOSPITAL_COMMUNITY): Payer: Self-pay

## 2021-11-24 ENCOUNTER — Ambulatory Visit (HOSPITAL_COMMUNITY): Payer: Medicare HMO | Attending: Family Medicine

## 2021-11-24 DIAGNOSIS — M79605 Pain in left leg: Secondary | ICD-10-CM | POA: Diagnosis not present

## 2021-11-24 DIAGNOSIS — M79604 Pain in right leg: Secondary | ICD-10-CM | POA: Diagnosis not present

## 2021-11-24 DIAGNOSIS — R269 Unspecified abnormalities of gait and mobility: Secondary | ICD-10-CM | POA: Insufficient documentation

## 2021-11-24 DIAGNOSIS — M5459 Other low back pain: Secondary | ICD-10-CM

## 2021-11-24 DIAGNOSIS — R262 Difficulty in walking, not elsewhere classified: Secondary | ICD-10-CM

## 2021-11-24 DIAGNOSIS — J449 Chronic obstructive pulmonary disease, unspecified: Secondary | ICD-10-CM | POA: Diagnosis not present

## 2021-11-24 DIAGNOSIS — E119 Type 2 diabetes mellitus without complications: Secondary | ICD-10-CM | POA: Diagnosis not present

## 2021-11-24 DIAGNOSIS — M6281 Muscle weakness (generalized): Secondary | ICD-10-CM | POA: Diagnosis not present

## 2021-11-24 NOTE — Therapy (Signed)
OUTPATIENT PHYSICAL THERAPY LOWER EXTREMITY EVALUATION   Patient Name: Emily Humphrey MRN: [id number] DOB:[birth year], 71 y.o., female Today's Date: 11/24/2021   PT End of Session - 11/24/21 1435     Visit Number 7    Number of Visits 12    Date for PT Re-Evaluation 12/08/21    Authorization Type [employer]    Authorization Time Period 12 visits approved 10/26/21 - 6/23    Authorization - Visit Number 7    Authorization - Number of Visits 12    Progress Note Due on Visit 10    PT Start Time 1435    PT Stop Time 1515    PT Time Calculation (min) 40 min    Activity Tolerance Patient tolerated treatment well    Behavior During Therapy [facility] for tasks assessed/performed             Past Medical History:  Diagnosis Date   Abnormal gait    Asthma    Chest pain    Hospital, May, 2012,  Pericarditis   COPD (chronic obstructive pulmonary disease) ([place])    Chronic steroid use   Dyslipidemia    Ejection fraction    Normal, echo, ZTI,4580   GERD (gastroesophageal reflux disease)    History of tobacco abuse    IDDM (insulin dependent diabetes mellitus)    Morbid obesity (HCC)    OSA (obstructive sleep apnea)    mild/not using C-PAP    Pericardial effusion    Small, echo, circumferential, May, 2012   Pericarditis    Hospitalization, May, 2012   Pneumonia 2010   Tremor    Past Surgical History:  Procedure Laterality Date   ABDOMINAL HYSTERECTOMY     NASAL SINUS SURGERY     Removal of throat nodules     vocal cored nodules   Patient Active Problem List   Diagnosis Date Noted   DOE (dyspnea on exertion) 02/17/2021   Cerebral vascular disease 09/06/2020   Mild cognitive impairment 09/06/2020   Abnormal finding on MRI of brain 05/10/2020   Gait abnormality 05/10/2020   Essential hypertension, benign 08/01/2015   Type 2 diabetes mellitus, uncontrolled ([place])    Hyperlipidemia    COPD (chronic obstructive pulmonary disease) ([place])    History of tobacco abuse    Asthma    GERD  (gastroesophageal reflux disease)    Morbid obesity ([place])    Chest pain    Pericardial effusion    Ejection fraction    Pericarditis     PCP:  Lanelle Bal  REFERRING PROVIDER: Vallarie Mare, MD  REFERRING DIAG: B LE claudication   THERAPY DIAG:  Pain in right leg  Pain in left leg  Other low back pain  Difficulty in walking, not elsewhere classified  Muscle weakness (generalized)  ONSET DATE:  Pt states her back has been bothering her for a year.  Her leg pain has been going on for about six months.    SUBJECTIVE:   SUBJECTIVE STATEMENT:   Patient reports that her back is doing pretty good today, 3/10 pain but that she has been experiencing more muscle cramping in right calf.    PERTINENT HISTORY: Gait abnormality, DM, COPD  PAIN:  PAIN:  Are you having pain? Yes: NPRS scale: 3/10 Pain location: back Pain description: dull, ache, cramping Aggravating factors: walking Relieving factors: sitting    PRECAUTIONS: Fall  WEIGHT BEARING RESTRICTIONS No  FALLS:  Has patient fallen in last 6 months? Yes. Number of falls-2  LIVING ENVIRONMENT:  Lives with: lives alone Lives in: House/apartment Stairs: No Has following equipment at home: has a cane but does not use   OCCUPATION:  retired  PLOF: Independent  PATIENT GOALS  To be able to get out and walk in the malls and shopping and have less pain.   To be able to work in her flower garden.    OBJECTIVE:   COGNITION:  Overall cognitive status: Within functional limits for tasks assessed     SENSATION: Not tested  POSTURE:  Pt has increased Kyphosis, decrease lordosis Back ROM:                  Flex:                  Ext: Functional reps cause no sx changes                   LE MMT:  MMT Right 10/31/2021 Left 10/31/2021  Hip flexion 3/5 3/5  Hip extension 3/5 3/5  Hip abduction 3/5 3+/5  Hip adduction    Hip internal rotation    Hip external rotation    Knee flexion 3+/5 3+/5  Knee  extension 5/5 5/5  Ankle dorsiflexion 4+/5 4/5  Ankle plantarflexion    Ankle inversion    Ankle eversion     (Blank rows = not tested)  FUNCTIONAL TESTS:  30 seconds chair stand test:  10 in 30 seconds below average for age.   Single leg stance: Lt 4 sec, RT 1 sec  2 minute walking test: 342 ft.  Pt walks with posterior lean, flat footed     TODAY'S TREATMENT: 11/24/21 Nustep level 4 x5 mins Slant board stretch isometric hold-> pedal left and right foot up/down Bent leg lifts x15 Seated slight lean back and iso hold 4 seconds x10 Tap to opposite knee with iso metric resistance from contralateral UE 2 x5 b/l Wedge supported mini crunch-> mini crunch and rotation to reach therapist hand x10 each Seated physioball lateral stretch x5 b/l  11/20/21 Nustep level 4 x5 mins Review of HEP including Seated hamstring stretch Seated piriformis stretch Sit to stands with verbal cueing for knee control Ppt/ ab set with manual cueing to try increase abdominal activation Seated physioball forward roles 10 sec holds x 10 reps Seated physioball lateral roles 10 sec holds x 10 reps Pallof press red theraband x10 b/l Seated UE press with red theraband under feet x10 Seated march with 3 second hold, alternating x20    11/17/21   Nustep level 4 x5 mins   Manual stretch of hamstrings, glutes, hip adductors, hip flexor, quad , b/l   Seated piriformis stretch b/l  Seated physioball forward roles 5 sec holds x10 rolls Lateral physioball rolls lateral x10 b/l Standing shoulder extensions blue Standing rows blue Lifts and rotation with red tband x10 b/l Resisted cable column walkout at hips x3 plates anterior and retro Quadruped cat/camel x4 Standing manual cueing apt/ppt x5       11/15/21 Nustep level 3 x5 mins Trunk flexion stretch with large blue ball 3 way- 5x 10 second holds each STS 2x 10 without UE support Standing Alternating march 2x 20 bilateral  Standing Hip abduction 2x 10  bilateral     11/09/21  Nustep level 3 x5 mins  PB seated forward rolls x15  Seated hamstring stretch 10 sec hold x5 b/l   Seated piriformis stretch Stair stretch x5 with 5 sec holds performed b/l DKTC with ppt for  low ab engagement 3x10 Clamshell red tband  hooklying Shoulder extensions green tband x30 Shoulder flexion x15     10/31/21  Nustep 4 mins x level 3  HR/TR 2x10   Chair squat 2x10  Side steps with leg lifts 10'x6  Educated and discussed walking program  LTR with Pbx30  PB DKTC x20  Modified deadbug/iso abdominal 2x10  Seated PB forward rolls-> then lateral rolls x20 each   All at SBA       Ab set x 5 Knee to chest x 3  B LE hip abduction x 5 Bridge x 5    PATIENT EDUCATION:  Education details: HEp updated 11/20/21 Person educated: Patient Education method: Explanation, Tactile cues, Verbal cues, and Handouts Education comprehension: verbalized understanding   HOME EXERCISE PROGRAM: Access Code: [email or web address] URL: [email or web address] Date: 11/20/2021 Prepared by: Leota Jacobsen  Exercises - Seated Hip Flexion March with Ankle Weights  - 1 x daily - 7 x weekly - 3 sets - 10 reps  11/15/21- Sit to Stand with Arms Crossed  - 1 x daily - 7 x weekly - 3 sets - 10 reps  Access Code: [email or web address] URL: [email or web address] Date: 11/09/2021 Prepared by: Leota Jacobsen  Exercises - Seated Hamstring Stretch (BKA)  - 1 x daily - 7 x weekly - 3 sets - 10 reps - Seated Piriformis Stretch with Trunk Bend  - 1 x daily - 7 x weekly - 3 sets - 10 reps - Supine 90/90 Abdominal Bracing  - 1 x daily - 7 x weekly - 3 sets - 10 reps - Hooklying Clamshell with Resistance  - 1 x daily - 7 x weekly - 3 sets - 10 reps   Ab set x 5 B LE hip abduction x 5 Bridge x 5              Knee to chest x 3   ASSESSMENT:  CLINICAL IMPRESSION: Patient with increased left side mid/low back pain. Patient with great difficulty activating core. Pt able to get  some engagement with upper core exercises with mini sit ups from wedge. When trying lower core patient is hip flexor dominant and doesn't feel any work in belly. Patient needed constant cueing to slow down and focus on breath, other wise pt moves very quickly through exercise with no ability to stabilize. Patient often reverts to holding breath and throwing self into lumber lordosis when activating hip extensors. Patient mentions difficulty with balancing and walking, follow up with next session.  Patient will continue to benefit from physical therapy in order to improve function and reduce impairment.    OBJECTIVE IMPAIRMENTS Abnormal gait, decreased activity tolerance, decreased balance, difficulty walking, decreased strength, postural dysfunction, and pain.   ACTIVITY LIMITATIONS cleaning, community activity, meal prep, laundry, yard work, shopping, and yard work.   PERSONAL FACTORS Fitness and Time since onset of injury/illness/exacerbation are also affecting patient's functional outcome.    REHAB POTENTIAL: Good  CLINICAL DECISION MAKING: Evolving/moderate complexity  EVALUATION COMPLEXITY: Moderate   GOALS: Goals reviewed with patient? No  SHORT TERM GOALS: Target date: 11/17/2021    Pt to be I with HEP in order for her pain to greater  no greater than a 5/10 to be able to walk for 15 minutes with confidence. Baseline: Goal status: IN PROGRESS  2.  Pt mm strength to be increased 1/2 grade to allow pt to come sit to stand 12 x in 30"  Baseline:  Goal status: IN PROGRESS  3.  Pt  to be able to single leg stance for 10 second on each leg to reduce risk of falling  Baseline:  Goal status: IN PROGRESS  LONG TERM GOALS: Target date: 12/08/2021    PT to be I in an advanced HEP in order to allow pt to state that she is able to walk for 25 minutes with confidence  Baseline:  Goal status: IN PROGRESS  2.  Pt pain level to be no greater than a 3/10  Baseline:  Goal status: IN  PROGRESS  3.  PT to be walking without exhibiting a posterior lean  Baseline:  Goal status: IN PROGRESS  4.  PT mm strength to be increased one grade to be able to go up and down  4steps in a reciprocal manner  Baseline:  Goal status: IN PROGRESS     PLAN: PT FREQUENCY: 2x/week  PT DURATION: 6 weeks  PLANNED INTERVENTIONS: Therapeutic exercises, Therapeutic activity, Neuromuscular re-education, Balance training, Gait training, and Patient/Family education  PLAN FOR NEXT SESSION: continue to drive increased core activation and pelvic stabilization with mobility. Follow up with patients reports of decreased balance.   2:36 PM, 11/24/21 Raylan Troiani PT, DPT

## 2021-12-01 ENCOUNTER — Other Ambulatory Visit: Payer: Self-pay | Admitting: *Deleted

## 2021-12-01 DIAGNOSIS — R0989 Other specified symptoms and signs involving the circulatory and respiratory systems: Secondary | ICD-10-CM

## 2021-12-05 DIAGNOSIS — R197 Diarrhea, unspecified: Secondary | ICD-10-CM | POA: Diagnosis not present

## 2021-12-05 DIAGNOSIS — Z6827 Body mass index (BMI) 27.0-27.9, adult: Secondary | ICD-10-CM | POA: Diagnosis not present

## 2021-12-05 DIAGNOSIS — R5383 Other fatigue: Secondary | ICD-10-CM | POA: Diagnosis not present

## 2021-12-05 DIAGNOSIS — R110 Nausea: Secondary | ICD-10-CM | POA: Diagnosis not present

## 2021-12-05 DIAGNOSIS — R531 Weakness: Secondary | ICD-10-CM | POA: Diagnosis not present

## 2021-12-05 DIAGNOSIS — R030 Elevated blood-pressure reading, without diagnosis of hypertension: Secondary | ICD-10-CM | POA: Diagnosis not present

## 2021-12-06 ENCOUNTER — Encounter: Payer: Medicare HMO | Admitting: Vascular Surgery

## 2021-12-06 ENCOUNTER — Encounter (HOSPITAL_COMMUNITY): Payer: Medicare HMO

## 2021-12-08 ENCOUNTER — Encounter (HOSPITAL_COMMUNITY): Payer: Medicare HMO

## 2021-12-08 DIAGNOSIS — E875 Hyperkalemia: Secondary | ICD-10-CM | POA: Diagnosis not present

## 2021-12-08 DIAGNOSIS — J209 Acute bronchitis, unspecified: Secondary | ICD-10-CM | POA: Diagnosis not present

## 2021-12-08 DIAGNOSIS — J208 Acute bronchitis due to other specified organisms: Secondary | ICD-10-CM | POA: Diagnosis not present

## 2021-12-08 DIAGNOSIS — E7800 Pure hypercholesterolemia, unspecified: Secondary | ICD-10-CM | POA: Diagnosis not present

## 2021-12-08 DIAGNOSIS — R531 Weakness: Secondary | ICD-10-CM | POA: Diagnosis not present

## 2021-12-08 DIAGNOSIS — D849 Immunodeficiency, unspecified: Secondary | ICD-10-CM | POA: Diagnosis not present

## 2021-12-08 DIAGNOSIS — E119 Type 2 diabetes mellitus without complications: Secondary | ICD-10-CM | POA: Diagnosis not present

## 2021-12-08 DIAGNOSIS — J189 Pneumonia, unspecified organism: Secondary | ICD-10-CM | POA: Diagnosis not present

## 2021-12-08 DIAGNOSIS — Z87891 Personal history of nicotine dependence: Secondary | ICD-10-CM | POA: Diagnosis not present

## 2021-12-08 DIAGNOSIS — B9689 Other specified bacterial agents as the cause of diseases classified elsewhere: Secondary | ICD-10-CM | POA: Diagnosis not present

## 2021-12-08 DIAGNOSIS — J449 Chronic obstructive pulmonary disease, unspecified: Secondary | ICD-10-CM | POA: Diagnosis not present

## 2021-12-08 DIAGNOSIS — Z20822 Contact with and (suspected) exposure to covid-19: Secondary | ICD-10-CM | POA: Diagnosis not present

## 2021-12-08 DIAGNOSIS — I10 Essential (primary) hypertension: Secondary | ICD-10-CM | POA: Diagnosis not present

## 2021-12-11 ENCOUNTER — Ambulatory Visit: Payer: Medicare HMO | Admitting: Family Medicine

## 2021-12-13 ENCOUNTER — Ambulatory Visit: Payer: Medicare HMO | Admitting: Vascular Surgery

## 2021-12-13 ENCOUNTER — Encounter: Payer: Self-pay | Admitting: Vascular Surgery

## 2021-12-13 ENCOUNTER — Ambulatory Visit (INDEPENDENT_AMBULATORY_CARE_PROVIDER_SITE_OTHER): Payer: Medicare HMO

## 2021-12-13 VITALS — BP 115/72 | HR 100 | Temp 99.3°F | Resp 18 | Ht 62.0 in | Wt 157.0 lb

## 2021-12-13 DIAGNOSIS — R0989 Other specified symptoms and signs involving the circulatory and respiratory systems: Secondary | ICD-10-CM

## 2021-12-13 DIAGNOSIS — M25562 Pain in left knee: Secondary | ICD-10-CM

## 2021-12-13 DIAGNOSIS — M25561 Pain in right knee: Secondary | ICD-10-CM | POA: Diagnosis not present

## 2021-12-13 NOTE — Progress Notes (Signed)
[facility] [place]  Patient name: Emily Humphrey MRN: [id number] DOB: [birth year] Sex: female  REASON FOR CONSULT: Evaluate lower extremity discomfort and rule out arterial insufficiency  HPI: Emily Humphrey is a 71 y.o. female, who is here today for evaluation of lower extremity discomfort.  She reports that she has pain in both hips and thighs.  This is generally worse when she is walking.  She also reports unsteady gait feeling as though she will fall and that her legs will "give out".  She has no history of stroke.  She has no history of lower extremity tissue loss or rest pain.  She has undergone neurologic and neurosurgical evaluation with no clear-cut cause for her complaints.  Past Medical History:  Diagnosis Date   Abnormal gait    Asthma    Chest pain    Hospital, May, 2012,  Pericarditis   COPD (chronic obstructive pulmonary disease) ([place])    Chronic steroid use   Dyslipidemia    Ejection fraction    Normal, echo, ASN,0539   GERD (gastroesophageal reflux disease)    History of tobacco abuse    IDDM (insulin dependent diabetes mellitus)    Morbid obesity (HCC)    OSA (obstructive sleep apnea)    mild/not using C-PAP    Pericardial effusion    Small, echo, circumferential, May, 2012   Pericarditis    Hospitalization, May, 2012   Pneumonia 2010   Tremor     Family History  Problem Relation Age of Onset   Heart attack Mother        deceased at age 72   Asthma Mother    Other Father        deceased at age 82   Diabetes Sister     SOCIAL HISTORY: Social History   Socioeconomic History   Marital status: Divorced    Spouse name: Not on file   Number of children: 2   Years of education: 12   Highest education level: Not on file  Occupational History   Occupation: RETIRED    Comment: use to work at [employer]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   Caffeine 1 c daily   Social Determinants of Health   Financial Resource Strain: Not on file  Food Insecurity: Not on file  Transportation Needs: Not on file  Physical Activity: Not on file  Stress: Not on file  Social Connections: Not on file  Intimate Partner Violence: Not on file    Allergies  Allergen Reactions   Atorvastatin     Other reaction(s): Muscle Pain   Empagliflozin     Other reaction(s): yeast   Penicillins Itching    hives   Sulfa Antibiotics Itching    rash    Current Outpatient Medications  Medication Sig Dispense Refill   albuterol (PROVENTIL) (2.5 MG/3ML) 0.083% nebulizer solution  Take 2.5 mg by nebulization every 4 (four) hours as needed for wheezing or shortness of breath.     albuterol (VENTOLIN HFA) 108 (90 Base) MCG/ACT inhaler Inhale 2 puffs into the lungs every 4 (four) hours as needed. 18 g 6   ALPRAZolam (XANAX) 0.25 MG tablet Take 0.25 mg by mouth daily as needed.     Budeson-Glycopyrrol-Formoterol (BREZTRI AEROSPHERE) 160-9-4.8 MCG/ACT AERO Inhale 2 puffs into the lungs in the morning and at bedtime. 10.7 g 11   CALCIUM PO Take 1 tablet by mouth daily.     Continuous Blood Gluc Receiver (FREESTYLE LIBRE 2 READER) DEVI See admin instructions.     dicyclomine (BENTYL) 20 MG tablet Take 20 mg by mouth 3 (three) times daily as needed for spasms.     donepezil (ARICEPT) 10 MG tablet Take 1 tablet (10 mg total) by mouth at bedtime. 30 tablet 11   DULoxetine (CYMBALTA) 30 MG capsule Take 90 mg by mouth daily.     DULoxetine  (CYMBALTA) 60 MG capsule      famotidine (PEPCID) 20 MG tablet      gabapentin (NEURONTIN) 600 MG tablet Take 600 mg by mouth 3 (three) times daily.     insulin NPH-regular Human (70-30) 100 UNIT/ML injection Inject 50 Units into the skin daily with breakfast. Plus 20 u in evening     ipratropium (ATROVENT) 0.03 % nasal spray Place 1 spray in each nostril daily (can use up to 3 times daily if needed, but this can be over drying) 30 mL 5   metFORMIN (GLUCOPHAGE-XR) 500 MG 24 hr tablet Take 1,000 mg by mouth 2 (two) times daily.     metFORMIN (GLUMETZA) 500 MG (MOD) 24 hr tablet Take 1,000 mg by mouth daily.     olmesartan (BENICAR) 10 mg TABS tablet Take 10 mg by mouth daily.     pantoprazole (PROTONIX) 40 MG tablet Take 1 tablet by mouth Daily.     Potassium 99 MG TABS Take 1 tablet by mouth as needed.     predniSONE (DELTASONE) 10 MG tablet Take 10 mg by mouth daily.     predniSONE (DELTASONE) 10 MG tablet TAKE 1 TABLET BY MOUTH DAILY with breakfast FOR 100 doses 100 tablet 0   simvastatin (ZOCOR) 20 MG tablet Take 20 mg by mouth daily.     traMADol (ULTRAM) 50 MG tablet Take 50 mg by mouth 3 (three) times daily as needed.     VITAMIN D, CHOLECALCIFEROL, PO Take by mouth.     Budeson-Glycopyrrol-Formoterol (BREZTRI AEROSPHERE) 160-9-4.8 MCG/ACT AERO Inhale 2 puffs into the lungs 2 (two) times daily. 10.7 g 0   cephALEXin (KEFLEX) 500 MG capsule Take 500 mg by mouth 3 (three) times daily.     cyanocobalamin (,VITAMIN B-12,) 1000 MCG/ML injection 1 mL     diazepam (VALIUM) 10 MG tablet Take 1 tablet by mouth at bedtime.     doxycycline (VIBRA-TABS) 100 MG tablet Take 100 mg by mouth 2 (two) times daily.     DULoxetine (CYMBALTA) 30 MG capsule 3 capsules     LORazepam (ATIVAN) 0.5 MG tablet Take 1 tablet by mouth as needed.     MYRBETRIQ 25 MG TB24 tablet Take 25 mg by mouth daily.     OZEMPIC, 0.25 OR 0.5 MG/DOSE, 2 MG/3ML SOPN SMARTSIG:0.25 Milligram(s) SUB-Q Once a Week     Current  Facility-Administered Medications  Medication Dose Route Frequency Provider Last Rate Last Admin   dupilumab (DUPIXENT) prefilled syringe 300  mg  300 mg Subcutaneous Q14 Days Dara Hoyer, FNP   300 mg at 11/06/21 1717    REVIEW OF SYSTEMS:  '[X]'$  denotes positive finding, '[ ]'$  denotes negative finding Cardiac  Comments:  Chest pain or chest pressure:    Shortness of breath upon exertion:    Short of breath when lying flat:    Irregular heart rhythm:        Vascular    Pain in calf, thigh, or hip brought on by ambulation: x   Pain in feet at night that wakes you up from your sleep:  x   Blood clot in your veins:    Leg swelling:         Pulmonary    Oxygen at home:    Productive cough:     Wheezing:         Neurologic    Sudden weakness in arms or legs:     Sudden numbness in arms or legs:     Sudden onset of difficulty speaking or slurred speech:    Temporary loss of vision in one eye:     Problems with dizziness:         Gastrointestinal    Blood in stool:     Vomited blood:         Genitourinary    Burning when urinating:     Blood in urine:        Psychiatric    Major depression:         Hematologic    Bleeding problems:    Problems with blood clotting too easily:        Skin    Rashes or ulcers:        Constitutional    Fever or chills:      PHYSICAL EXAM: Vitals:   12/13/21 0841  BP: 115/72  Pulse: 100  Resp: 18  Temp: 99.3 F (37.4 C)  TempSrc: Temporal  SpO2: 95%  Weight: 157 lb (71.2 kg)  Height: '5\' 2"'$  (1.575 m)    GENERAL: The patient is a well-nourished female, in no acute distress. The vital signs are documented above. CARDIOVASCULAR: 2+ radial pulses bilaterally.  2+ left posterior tibial and 2+ right dorsalis pedis pulse PULMONARY: There is good air exchange  MUSCULOSKELETAL: There are no major deformities or cyanosis. NEUROLOGIC: No focal weakness or paresthesias are detected. SKIN: There are no ulcers or rashes noted. PSYCHIATRIC:  The patient has a normal affect.  DATA:  Noninvasive studies today were reviewed with the patient.  This showed completely normal ankle arm index bilaterally and normal triphasic waveforms bilaterally  MEDICAL ISSUES: I discussed these findings with the patient.  I do not see any evidence of arterial insufficiency to explain her symptoms.  It does appear to be more neurogenic.  She did undergo neurosurgical evaluation with Dr. Marcello Moores to did not see any cause for her symptoms as well.  He had suggested potential return to Dr. Krista Blue with neurology and I discussed this with the patient as well.  She will see [place] again on an as-needed basis   Emily Posner, MD [facility] Tel [phone] Pager [phone]  Note: Portions of this report may have been transcribed using voice recognition software.  Every effort has been made to ensure accuracy; however, inadvertent computerized transcription errors may still be present.

## 2021-12-14 DIAGNOSIS — R110 Nausea: Secondary | ICD-10-CM | POA: Diagnosis not present

## 2021-12-14 DIAGNOSIS — R5383 Other fatigue: Secondary | ICD-10-CM | POA: Diagnosis not present

## 2021-12-14 DIAGNOSIS — Z6827 Body mass index (BMI) 27.0-27.9, adult: Secondary | ICD-10-CM | POA: Diagnosis not present

## 2021-12-16 ENCOUNTER — Other Ambulatory Visit: Payer: Self-pay | Admitting: Internal Medicine

## 2021-12-18 ENCOUNTER — Telehealth (HOSPITAL_COMMUNITY): Payer: Self-pay | Admitting: Physical Therapy

## 2021-12-18 NOTE — Telephone Encounter (Signed)
Patient called to request to be put on hold until she can figure out what is going on with her and reason she can not walk. She will be in touch with [place].

## 2021-12-21 ENCOUNTER — Encounter (HOSPITAL_COMMUNITY): Payer: Medicare HMO | Admitting: Physical Therapy

## 2021-12-21 NOTE — Progress Notes (Deleted)
   [place], [place] [address] Dept: [phone]  FOLLOW UP NOTE  Patient ID: Emily Humphrey, female    DOB: June 08, 1951  Age: 71 y.o. MRN: [id number] Date of Office Visit: 12/22/2021  Assessment  Chief Complaint: No chief complaint on file.  HPI Emily Humphrey is a 71 year old female who presents to the clinic for follow-up visit.  She was last seen in this clinic on 11/10/2021 by Dr. Ernst Bowler for evaluation of asthma COPD overlap syndrome, chronic rhinitis, nasal polyposis, and chronic prednisone use.   Drug Allergies:  Allergies  Allergen Reactions   Atorvastatin     Other reaction(s): Muscle Pain   Empagliflozin     Other reaction(s): yeast   Penicillins Itching    hives   Sulfa Antibiotics Itching    rash    Physical Exam: There were no vitals taken for this visit.   Physical Exam  Diagnostics:    Assessment and Plan: No diagnosis found.  No orders of the defined types were placed in this encounter.   There are no Patient Instructions on file for this visit.  No follow-ups on file.    Thank you for the opportunity to care for this patient.  Please do not hesitate to contact me with questions.  Gareth Morgan, FNP Allergy and [place] [facility] [place]

## 2021-12-22 ENCOUNTER — Ambulatory Visit: Payer: Medicare HMO | Admitting: Family Medicine

## 2021-12-26 ENCOUNTER — Encounter (HOSPITAL_COMMUNITY): Payer: Medicare HMO

## 2021-12-26 DIAGNOSIS — I10 Essential (primary) hypertension: Secondary | ICD-10-CM | POA: Diagnosis not present

## 2021-12-26 DIAGNOSIS — E1165 Type 2 diabetes mellitus with hyperglycemia: Secondary | ICD-10-CM | POA: Diagnosis not present

## 2021-12-26 DIAGNOSIS — E875 Hyperkalemia: Secondary | ICD-10-CM | POA: Diagnosis not present

## 2021-12-26 DIAGNOSIS — K219 Gastro-esophageal reflux disease without esophagitis: Secondary | ICD-10-CM | POA: Diagnosis not present

## 2021-12-26 DIAGNOSIS — R5383 Other fatigue: Secondary | ICD-10-CM | POA: Diagnosis not present

## 2021-12-26 DIAGNOSIS — E7849 Other hyperlipidemia: Secondary | ICD-10-CM | POA: Diagnosis not present

## 2021-12-26 DIAGNOSIS — Z1329 Encounter for screening for other suspected endocrine disorder: Secondary | ICD-10-CM | POA: Diagnosis not present

## 2021-12-29 ENCOUNTER — Encounter (HOSPITAL_COMMUNITY): Payer: Medicare HMO

## 2021-12-29 DIAGNOSIS — D72829 Elevated white blood cell count, unspecified: Secondary | ICD-10-CM | POA: Diagnosis not present

## 2021-12-29 DIAGNOSIS — E1165 Type 2 diabetes mellitus with hyperglycemia: Secondary | ICD-10-CM | POA: Diagnosis not present

## 2021-12-29 DIAGNOSIS — E7849 Other hyperlipidemia: Secondary | ICD-10-CM | POA: Diagnosis not present

## 2021-12-29 DIAGNOSIS — I10 Essential (primary) hypertension: Secondary | ICD-10-CM | POA: Diagnosis not present

## 2021-12-29 DIAGNOSIS — E875 Hyperkalemia: Secondary | ICD-10-CM | POA: Diagnosis not present

## 2021-12-29 DIAGNOSIS — Z6827 Body mass index (BMI) 27.0-27.9, adult: Secondary | ICD-10-CM | POA: Diagnosis not present

## 2021-12-29 DIAGNOSIS — E1140 Type 2 diabetes mellitus with diabetic neuropathy, unspecified: Secondary | ICD-10-CM | POA: Diagnosis not present

## 2022-01-03 ENCOUNTER — Encounter (HOSPITAL_COMMUNITY): Payer: Medicare HMO | Admitting: Physical Therapy

## 2022-01-05 ENCOUNTER — Encounter (HOSPITAL_COMMUNITY): Payer: Medicare HMO

## 2022-01-11 ENCOUNTER — Encounter (HOSPITAL_COMMUNITY): Payer: Medicare HMO | Admitting: Physical Therapy

## 2022-01-12 ENCOUNTER — Ambulatory Visit: Payer: Medicare HMO | Admitting: Family Medicine

## 2022-01-12 ENCOUNTER — Encounter (HOSPITAL_COMMUNITY): Payer: Medicare HMO | Admitting: Physical Therapy

## 2022-01-15 ENCOUNTER — Telehealth: Payer: Self-pay | Admitting: *Deleted

## 2022-01-15 DIAGNOSIS — M549 Dorsalgia, unspecified: Secondary | ICD-10-CM | POA: Diagnosis not present

## 2022-01-15 DIAGNOSIS — R030 Elevated blood-pressure reading, without diagnosis of hypertension: Secondary | ICD-10-CM | POA: Diagnosis not present

## 2022-01-15 DIAGNOSIS — Z6826 Body mass index (BMI) 26.0-26.9, adult: Secondary | ICD-10-CM | POA: Diagnosis not present

## 2022-01-15 DIAGNOSIS — R300 Dysuria: Secondary | ICD-10-CM | POA: Diagnosis not present

## 2022-01-15 DIAGNOSIS — J209 Acute bronchitis, unspecified: Secondary | ICD-10-CM | POA: Diagnosis not present

## 2022-01-15 DIAGNOSIS — R059 Cough, unspecified: Secondary | ICD-10-CM | POA: Diagnosis not present

## 2022-01-15 NOTE — Telephone Encounter (Signed)
Patient called and advised some burning on her back this weekend. Saw her PCP today and she advised no rash but PCP advised that possibly shingles. Patient has had vaccines in past.  She also advised that she is having increased joint pain.  She doesn't know if could be side effects of her Dupixent. She is due for dose today and wants to know if she should take same or wait until her appt for to see Webb Silversmith on 8/11 to discuss continuation

## 2022-01-16 NOTE — Telephone Encounter (Signed)
L/M for patient to contact me

## 2022-01-16 NOTE — Telephone Encounter (Signed)
Goodness - I think Dr. Nelva Bush had a patient with joint pains from the [place]. We could always stop for a couple of doses to see if it helps. She could wait to see Webb Silversmith or stop now.   Salvatore Marvel, MD Allergy and [place] [facility] [place]

## 2022-01-16 NOTE — Telephone Encounter (Signed)
Advised patient of instructions and she is going stop for now until she sees Webb Silversmith next week

## 2022-01-25 NOTE — Progress Notes (Deleted)
   [place], [place] [address] Dept: [phone]  FOLLOW UP NOTE  Patient ID: Emily Humphrey, female    DOB: 1950-09-05  Age: 71 y.o. MRN: [id number] Date of Office Visit: 01/26/2022  Assessment  Chief Complaint: No chief complaint on file.  HPI Emily Humphrey is a 71 year old female who presents to the clinic for follow-up visit.  She was last seen in this clinic on 11/10/2021 for evaluation of asthma/COPD, chronic rhinitis, nasal polyposis, and chronic prednisone use.  Her last environmental allergy skin testing was on 04/14/2021 which was negative to the environmental panel including negative histamine.  On 09/13/2021 she had lab testing to the environmental panel which was negative to the entire panel.   Drug Allergies:  Allergies  Allergen Reactions   Atorvastatin     Other reaction(s): Muscle Pain   Empagliflozin     Other reaction(s): yeast   Penicillins Itching    hives   Sulfa Antibiotics Itching    rash    Physical Exam: There were no vitals taken for this visit.   Physical Exam  Diagnostics:    Assessment and Plan: No diagnosis found.  No orders of the defined types were placed in this encounter.   There are no Patient Instructions on file for this visit.  No follow-ups on file.    Thank you for the opportunity to care for this patient.  Please do not hesitate to contact me with questions.  Gareth Morgan, FNP Allergy and [place] [facility] [place]

## 2022-01-26 ENCOUNTER — Ambulatory Visit: Payer: Medicare HMO | Admitting: Family Medicine

## 2022-01-26 ENCOUNTER — Telehealth: Payer: Self-pay | Admitting: Family Medicine

## 2022-01-26 ENCOUNTER — Telehealth: Payer: Self-pay | Admitting: *Deleted

## 2022-01-26 NOTE — Telephone Encounter (Signed)
-----   Message from Dara Hoyer, FNP sent at 01/26/2022  4:03 PM EDT ----- Can you please call this patient and schedule an appointment for Monday? Thank you

## 2022-01-26 NOTE — Telephone Encounter (Signed)
I was able to contact Emily Humphrey by telephone.  She reports that she had a sore throat earlier today and went to her primary care provider and subsequently missed her appointment at the allergy and asthma clinic.  She will reschedule on Monday.

## 2022-01-26 NOTE — Telephone Encounter (Signed)
Called and spoke with the patient, she has been scheduled to come see you next Friday in [place].

## 2022-01-31 DIAGNOSIS — M545 Low back pain, unspecified: Secondary | ICD-10-CM | POA: Diagnosis not present

## 2022-01-31 DIAGNOSIS — Z6825 Body mass index (BMI) 25.0-25.9, adult: Secondary | ICD-10-CM | POA: Diagnosis not present

## 2022-01-31 DIAGNOSIS — R030 Elevated blood-pressure reading, without diagnosis of hypertension: Secondary | ICD-10-CM | POA: Diagnosis not present

## 2022-01-31 DIAGNOSIS — M25552 Pain in left hip: Secondary | ICD-10-CM | POA: Diagnosis not present

## 2022-02-02 ENCOUNTER — Ambulatory Visit: Payer: Medicare HMO | Admitting: Family Medicine

## 2022-02-07 ENCOUNTER — Ambulatory Visit: Payer: Medicare HMO | Admitting: Family

## 2022-02-07 ENCOUNTER — Other Ambulatory Visit: Payer: Self-pay

## 2022-02-07 ENCOUNTER — Encounter: Payer: Self-pay | Admitting: Family

## 2022-02-07 VITALS — BP 122/74 | HR 97 | Resp 15

## 2022-02-07 DIAGNOSIS — J339 Nasal polyp, unspecified: Secondary | ICD-10-CM

## 2022-02-07 DIAGNOSIS — T50905D Adverse effect of unspecified drugs, medicaments and biological substances, subsequent encounter: Secondary | ICD-10-CM

## 2022-02-07 DIAGNOSIS — J310 Chronic rhinitis: Secondary | ICD-10-CM

## 2022-02-07 DIAGNOSIS — J449 Chronic obstructive pulmonary disease, unspecified: Secondary | ICD-10-CM

## 2022-02-07 MED ORDER — DOXYCYCLINE MONOHYDRATE 100 MG PO TABS: 100.0000 mg | ORAL_TABLET | Freq: Two times a day (BID) | ORAL | 0 refills | Status: DC

## 2022-02-07 NOTE — Progress Notes (Signed)
[place], [place] [address] Dept: [phone]  FOLLOW UP NOTE  Patient ID: Emily Humphrey, female    DOB: 01/27/1951  Age: 71 y.o. MRN: [id number] Date of Office Visit: 02/07/2022  Assessment  Chief Complaint: Asthma  HPI Emily Humphrey is a 71 year old female who presents today for follow up for adverse effect of drug, asthma-copd overlap syndrome,viral syndrome-with negative COVID testing,chronic rhinitis, nasal polyposis, and chronic prednisone use. She was last seen on 11/10/21 by Dr. Ernst Bowler. She denies any new diagnosis or surgeries since her last office visit. She is now off a blood pressure medicine due to her blood pressure being low.  She reports that starting in May she became sick and was sick until mid July. She now feels 100% better. When she was sick she was not eating or drinking. She also had joint aches.During that time she stopped her Dupixent injections in June because her primary care physician felt that it could be the cause of her symptoms. She saw her primary care physician last week and it was decided that she should go back on [place]. She did feel like Dupixent helped with her asthma.   Asthma-COPD overlap syndrome: she is currently taking Breztri 2 puffs twice a day with spacer, prednisone 10 mg once  a day, and albuterol as needed. She reports a productive cough with yellow sputum in the morning. This has been going on for several days. She is not sure if she has post nasal drip. She also reports shortness of breath and tightness in her chest. She denies fever, chills, wheezing, and nocturnal awakenings due to breathing problems. She did feels like her breathing was better with Dupixent and wants to restart Dupixent. She has not used albuterol since starting Dupixent. She did go to the ER in June and was diagnosed with acute bacterial bronchitis. She was given azithromycin. She did have a chest x-ray that showed:" no active cardiopulmonary  disease." She reports that she has been on prednisone 10 mg once a day for years. She has not received any prednisone bursts since her last office visit. She follows with endocrinology in [place].  Chronic rhinitis: She is currently taking Claritin 10 mg once a day and Atrovent nasal spray twice a day. She reports a long time ago she had sinus surgery for nasal polyps, but she his still not able to smell and cannot taste a lot. She reports clear rhinorrhea and forever having nasal congestion and the sensation of her head feeling stopped up.She is not certain if she has post nasal drip. She has not had any sinus infections since we last saw her.   Drug Allergies:  Allergies  Allergen Reactions   Atorvastatin     Other reaction(s): Muscle Pain   Empagliflozin     Other reaction(s): yeast   Penicillins Itching    hives   Sulfa Antibiotics Itching    rash    Review of Systems: Review of Systems  Constitutional:  Negative for chills and fever.  HENT:         Reports clear rhinorrhea, nasal congestion, anosmia, aguesia. She is not certain if she has post nasal drip  Eyes:        Reports watery eyes due to very dry eyes-currently taking prescription eye drops  Respiratory:  Positive for cough. Negative for shortness of breath and wheezing.        Reports cough in the morning that is productive yellow sputum. Also reports some  shortness of breath and tightness in chest. Denies wheeze and nocturnal awakenings due to breathing problems  Cardiovascular:  Negative for chest pain and palpitations.  Gastrointestinal:        Denies heartburn and reflux on pantoprazole  Genitourinary:  Positive for frequency.       Reports increased frequency of urination due to increased intake of fluids  Skin:  Negative for itching and rash.  Neurological:  Negative for headaches.    Physical Exam: BP 122/74   Pulse 97   Resp 15   SpO2 96%    Physical Exam Constitutional:      Appearance: Normal  appearance.  HENT:     Head: Normocephalic and atraumatic.     Comments: Pharynx normal. Eyes normal. Ears normal. Nose: bilateral lower turbinates mildly edematous with no drainage noted.    Right Ear: Tympanic membrane, ear canal and external ear normal.     Left Ear: Tympanic membrane, ear canal and external ear normal.     Mouth/Throat:     Mouth: Mucous membranes are moist.     Pharynx: Oropharynx is clear.  Eyes:     Conjunctiva/sclera: Conjunctivae normal.  Cardiovascular:     Rate and Rhythm: Regular rhythm. Tachycardia present.     Heart sounds: Normal heart sounds.  Pulmonary:     Effort: Pulmonary effort is normal.     Breath sounds: Normal breath sounds.     Comments: Lungs clear to auscultation Musculoskeletal:     Cervical back: Neck supple.  Skin:    General: Skin is warm.  Neurological:     Mental Status: She is alert and oriented to person, place, and time.  Psychiatric:        Mood and Affect: Mood normal.        Behavior: Behavior normal.        Thought Content: Thought content normal.        Judgment: Judgment normal.     Diagnostics: FVC 1.84 L (69%), FEV1 1.46 L (71%).  Predicted FVC 2.66 L, predicted FEV1 2.07 L.  Spirometry indicates possible mild restriction.  Spirometry has improved from previous spirometry.  Assessment and Plan: 1. Asthma-COPD overlap syndrome ([place])   2. Chronic rhinitis   3. Adverse effect of drug, subsequent encounter   4. Nasal polyposis     Meds ordered this encounter  Medications   doxycycline (ADOXA) 100 MG tablet    Sig: Take 1 tablet (100 mg total) by mouth 2 (two) times daily.    Dispense:  14 tablet    Refill:  0    Patient Instructions  1. Asthma-COPD overlap syndrome ([place]) - Continue with the Breztri 2 puffs twice a day with spacer to help prevent cough and wheeze -Continue prednisone 10 mg once a day as per your primary care physician -After speaking with Tammy, our biologics coordinator, you need to call  [facility] at [phone] to start [place] back  -2. Chronic rhinitis (previous testing had non-reactive histamine) - Start doxycycline 100 mg taking 1 tablet twice a day for 7 days - Continue with the loratadine '10mg'$  daily. - Continue Atrovent (ipratropium) one spray per nostril daily (can use up to 3 times daily if needed, but this can be overdrying).  2. Schedule a follow up appointment in 2 months or sooner if needed     Return in about 2 months (around 04/09/2022), or if symptoms worsen or fail to improve.    Thank you for the opportunity to  care for this patient.  Please do not hesitate to contact me with questions.  Althea Charon, FNP Allergy and [place] [facility] [place]

## 2022-02-07 NOTE — Patient Instructions (Addendum)
1. Asthma-COPD overlap syndrome ([place]) - Continue with the Breztri 2 puffs twice a day with spacer to help prevent cough and wheeze -Continue prednisone 10 mg once a day as per your primary care physician -After speaking with Tammy, our biologics coordinator, you need to call [facility] at [phone] to start [place] back  -2. Chronic rhinitis (previous testing had non-reactive histamine) - Start doxycycline 100 mg taking 1 tablet twice a day for 7 days - Continue with the loratadine '10mg'$  daily. - Continue Atrovent (ipratropium) one spray per nostril daily (can use up to 3 times daily if needed, but this can be overdrying).  2. Schedule a follow up appointment in 2 months or sooner if needed

## 2022-02-09 ENCOUNTER — Ambulatory Visit: Payer: Medicare HMO | Admitting: Family Medicine

## 2022-02-13 DIAGNOSIS — M25552 Pain in left hip: Secondary | ICD-10-CM | POA: Diagnosis not present

## 2022-02-13 DIAGNOSIS — M25551 Pain in right hip: Secondary | ICD-10-CM | POA: Diagnosis not present

## 2022-03-14 DIAGNOSIS — F411 Generalized anxiety disorder: Secondary | ICD-10-CM | POA: Diagnosis not present

## 2022-03-14 DIAGNOSIS — R030 Elevated blood-pressure reading, without diagnosis of hypertension: Secondary | ICD-10-CM | POA: Diagnosis not present

## 2022-03-14 DIAGNOSIS — Z6826 Body mass index (BMI) 26.0-26.9, adult: Secondary | ICD-10-CM | POA: Diagnosis not present

## 2022-04-05 ENCOUNTER — Encounter (INDEPENDENT_AMBULATORY_CARE_PROVIDER_SITE_OTHER): Payer: Self-pay | Admitting: *Deleted

## 2022-04-09 NOTE — Patient Instructions (Incomplete)
1. Asthma-COPD overlap syndrome ([place]) - Continue with the Breztri 2 puffs twice a day with spacer to help prevent cough and wheeze -Continue prednisone 10 mg once a day as per your primary care physician -Continue Dupixent injections every 14 days  -2. Chronic rhinitis (previous testing had non-reactive histamine: lab work from 09/13/21 positive to a couple molds) - Continue with the loratadine '10mg'$  daily. - Continue Atrovent (ipratropium) one spray per nostril daily (can use up to 3 times daily if needed, but this can be overdrying).  2. Schedule a follow up appointment in  months or sooner if needed

## 2022-04-11 ENCOUNTER — Ambulatory Visit: Payer: Medicare HMO | Admitting: Family

## 2022-04-18 IMAGING — NM NM PULMONARY PERF PARTICULATE
8 series · 8 of 8 positions shown · non-contrast
Comparison: Chest radiograph 04/07/2021

CLINICAL DATA: Abnormal chest radiograph, question pulmonary
embolism

EXAM:
NUCLEAR MEDICINE PERFUSION LUNG SCAN
TECHNIQUE: Perfusion images were obtained in multiple projections after
intravenous injection of radiopharmaceutical.
Ventilation scans intentionally deferred if perfusion scan and chest
x-ray adequate for interpretation during COVID 19 epidemic.
RADIOPHARMACEUTICALS:  4.2 mCi 8c-CCm MAA IV

[Series 1: ant/post perf · 4.14mm/px · 1 of 1 slices shown (1 of 2)]
[im 1/1]
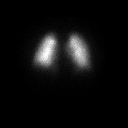

[Series 1: ant/post perf · 4.14mm/px · 1 of 1 slices shown (2 of 2)]
[im 1/1]
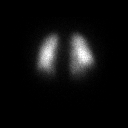

[Series 2: lao/rpo perf · 4.14mm/px · 1 of 1 slices shown (1 of 2)]
[im 1/1]
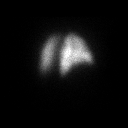

[Series 2: lao/rpo perf · 4.14mm/px · 1 of 1 slices shown (2 of 2)]
[im 1/1]
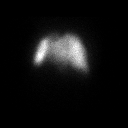

[Series 3: lt lat/rt lat perf · 4.14mm/px · 1 of 1 slices shown (1 of 2)]
[im 1/1]
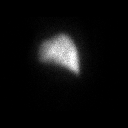

[Series 3: lt lat/rt lat perf · 4.14mm/px · 1 of 1 slices shown (2 of 2)]
[im 1/1]
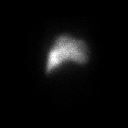

[Series 4: lpo/rao perf · 4.14mm/px · 1 of 1 slices shown (1 of 2)]
[im 1/1]
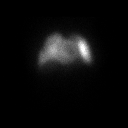

[Series 4: lpo/rao perf · 4.14mm/px · 1 of 1 slices shown (2 of 2)]
[im 1/1]
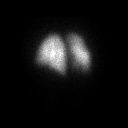

[8 of 8 positions shown; findings below may reference images not displayed]

FINDINGS: Exam made available for interpretation on 08/09/2021.

Normal perfusion lung scan.

No perfusion defects.
IMPRESSION: Normal perfusion lung scan.

## 2022-04-19 DIAGNOSIS — Z6826 Body mass index (BMI) 26.0-26.9, adult: Secondary | ICD-10-CM | POA: Diagnosis not present

## 2022-04-19 DIAGNOSIS — R1032 Left lower quadrant pain: Secondary | ICD-10-CM | POA: Diagnosis not present

## 2022-04-19 DIAGNOSIS — R030 Elevated blood-pressure reading, without diagnosis of hypertension: Secondary | ICD-10-CM | POA: Diagnosis not present

## 2022-04-19 DIAGNOSIS — Z23 Encounter for immunization: Secondary | ICD-10-CM | POA: Diagnosis not present

## 2022-04-19 DIAGNOSIS — F411 Generalized anxiety disorder: Secondary | ICD-10-CM | POA: Diagnosis not present

## 2022-04-19 DIAGNOSIS — R197 Diarrhea, unspecified: Secondary | ICD-10-CM | POA: Diagnosis not present

## 2022-04-26 DIAGNOSIS — K219 Gastro-esophageal reflux disease without esophagitis: Secondary | ICD-10-CM | POA: Diagnosis not present

## 2022-04-26 DIAGNOSIS — I10 Essential (primary) hypertension: Secondary | ICD-10-CM | POA: Diagnosis not present

## 2022-04-26 DIAGNOSIS — E7849 Other hyperlipidemia: Secondary | ICD-10-CM | POA: Diagnosis not present

## 2022-04-26 DIAGNOSIS — E1165 Type 2 diabetes mellitus with hyperglycemia: Secondary | ICD-10-CM | POA: Diagnosis not present

## 2022-04-26 DIAGNOSIS — R5383 Other fatigue: Secondary | ICD-10-CM | POA: Diagnosis not present

## 2022-04-26 DIAGNOSIS — Z1329 Encounter for screening for other suspected endocrine disorder: Secondary | ICD-10-CM | POA: Diagnosis not present

## 2022-04-26 DIAGNOSIS — E782 Mixed hyperlipidemia: Secondary | ICD-10-CM | POA: Diagnosis not present

## 2022-04-30 DIAGNOSIS — Z23 Encounter for immunization: Secondary | ICD-10-CM | POA: Diagnosis not present

## 2022-04-30 DIAGNOSIS — D72829 Elevated white blood cell count, unspecified: Secondary | ICD-10-CM | POA: Diagnosis not present

## 2022-04-30 DIAGNOSIS — E1140 Type 2 diabetes mellitus with diabetic neuropathy, unspecified: Secondary | ICD-10-CM | POA: Diagnosis not present

## 2022-04-30 DIAGNOSIS — E7849 Other hyperlipidemia: Secondary | ICD-10-CM | POA: Diagnosis not present

## 2022-04-30 DIAGNOSIS — E1165 Type 2 diabetes mellitus with hyperglycemia: Secondary | ICD-10-CM | POA: Diagnosis not present

## 2022-04-30 DIAGNOSIS — Z6827 Body mass index (BMI) 27.0-27.9, adult: Secondary | ICD-10-CM | POA: Diagnosis not present

## 2022-04-30 DIAGNOSIS — I10 Essential (primary) hypertension: Secondary | ICD-10-CM | POA: Diagnosis not present

## 2022-05-15 DIAGNOSIS — Z23 Encounter for immunization: Secondary | ICD-10-CM | POA: Diagnosis not present

## 2022-05-17 DIAGNOSIS — R1032 Left lower quadrant pain: Secondary | ICD-10-CM | POA: Diagnosis not present

## 2022-05-17 DIAGNOSIS — R109 Unspecified abdominal pain: Secondary | ICD-10-CM | POA: Diagnosis not present

## 2022-05-17 DIAGNOSIS — R197 Diarrhea, unspecified: Secondary | ICD-10-CM | POA: Diagnosis not present

## 2022-05-17 DIAGNOSIS — I700 Atherosclerosis of aorta: Secondary | ICD-10-CM | POA: Diagnosis not present

## 2022-05-21 ENCOUNTER — Telehealth: Payer: Self-pay | Admitting: Internal Medicine

## 2022-05-22 MED ORDER — BREZTRI AEROSPHERE 160-9-4.8 MCG/ACT IN AERO: 2.0000 | INHALATION_SPRAY | Freq: Two times a day (BID) | RESPIRATORY_TRACT | 11 refills | Status: DC

## 2022-05-22 NOTE — Telephone Encounter (Signed)
RX printed and placed in Dr. Morrison Old folder to sign.  Will update patient and encounter once form has been faxed

## 2022-05-22 NOTE — Telephone Encounter (Signed)
Called and notified patient that rx was faxed to [facility]. Advised her to call if she needed anything further and she expressed understanding. Nothing further needed at this time.

## 2022-06-07 DIAGNOSIS — R197 Diarrhea, unspecified: Secondary | ICD-10-CM | POA: Diagnosis not present

## 2022-06-13 DIAGNOSIS — S80211A Abrasion, right knee, initial encounter: Secondary | ICD-10-CM | POA: Diagnosis not present

## 2022-06-13 DIAGNOSIS — J189 Pneumonia, unspecified organism: Secondary | ICD-10-CM | POA: Diagnosis not present

## 2022-06-13 DIAGNOSIS — R030 Elevated blood-pressure reading, without diagnosis of hypertension: Secondary | ICD-10-CM | POA: Diagnosis not present

## 2022-06-15 DIAGNOSIS — R1111 Vomiting without nausea: Secondary | ICD-10-CM | POA: Diagnosis not present

## 2022-06-15 DIAGNOSIS — Z1152 Encounter for screening for COVID-19: Secondary | ICD-10-CM | POA: Diagnosis not present

## 2022-06-15 DIAGNOSIS — Z87891 Personal history of nicotine dependence: Secondary | ICD-10-CM | POA: Diagnosis not present

## 2022-06-15 DIAGNOSIS — K529 Noninfective gastroenteritis and colitis, unspecified: Secondary | ICD-10-CM | POA: Diagnosis not present

## 2022-06-15 DIAGNOSIS — Z880 Allergy status to penicillin: Secondary | ICD-10-CM | POA: Diagnosis not present

## 2022-06-15 DIAGNOSIS — R059 Cough, unspecified: Secondary | ICD-10-CM | POA: Diagnosis not present

## 2022-06-15 DIAGNOSIS — J189 Pneumonia, unspecified organism: Secondary | ICD-10-CM | POA: Diagnosis not present

## 2022-06-15 DIAGNOSIS — R197 Diarrhea, unspecified: Secondary | ICD-10-CM | POA: Diagnosis not present

## 2022-06-15 DIAGNOSIS — Z7984 Long term (current) use of oral hypoglycemic drugs: Secondary | ICD-10-CM | POA: Diagnosis not present

## 2022-06-15 DIAGNOSIS — I10 Essential (primary) hypertension: Secondary | ICD-10-CM | POA: Diagnosis not present

## 2022-06-15 DIAGNOSIS — J069 Acute upper respiratory infection, unspecified: Secondary | ICD-10-CM | POA: Diagnosis not present

## 2022-06-15 DIAGNOSIS — Z882 Allergy status to sulfonamides status: Secondary | ICD-10-CM | POA: Diagnosis not present

## 2022-06-15 DIAGNOSIS — E860 Dehydration: Secondary | ICD-10-CM | POA: Diagnosis not present

## 2022-06-15 DIAGNOSIS — R110 Nausea: Secondary | ICD-10-CM | POA: Diagnosis not present

## 2022-06-15 DIAGNOSIS — R0602 Shortness of breath: Secondary | ICD-10-CM | POA: Diagnosis not present

## 2022-07-02 ENCOUNTER — Ambulatory Visit (INDEPENDENT_AMBULATORY_CARE_PROVIDER_SITE_OTHER): Payer: Medicare HMO | Admitting: Gastroenterology

## 2022-07-16 ENCOUNTER — Encounter: Payer: Self-pay | Admitting: Internal Medicine

## 2022-07-16 ENCOUNTER — Ambulatory Visit: Payer: Medicare HMO | Admitting: Internal Medicine

## 2022-07-16 VITALS — BP 118/72 | HR 96 | Ht 62.0 in | Wt 156.4 lb

## 2022-07-16 DIAGNOSIS — E875 Hyperkalemia: Secondary | ICD-10-CM | POA: Diagnosis not present

## 2022-07-16 DIAGNOSIS — Z794 Long term (current) use of insulin: Secondary | ICD-10-CM | POA: Diagnosis not present

## 2022-07-16 DIAGNOSIS — E1142 Type 2 diabetes mellitus with diabetic polyneuropathy: Secondary | ICD-10-CM | POA: Diagnosis not present

## 2022-07-16 DIAGNOSIS — R531 Weakness: Secondary | ICD-10-CM | POA: Diagnosis not present

## 2022-07-16 DIAGNOSIS — D72829 Elevated white blood cell count, unspecified: Secondary | ICD-10-CM | POA: Diagnosis not present

## 2022-07-16 DIAGNOSIS — E1165 Type 2 diabetes mellitus with hyperglycemia: Secondary | ICD-10-CM | POA: Diagnosis not present

## 2022-07-16 DIAGNOSIS — R946 Abnormal results of thyroid function studies: Secondary | ICD-10-CM | POA: Diagnosis not present

## 2022-07-16 DIAGNOSIS — R5383 Other fatigue: Secondary | ICD-10-CM | POA: Diagnosis not present

## 2022-07-16 MED ORDER — INSULIN PEN NEEDLE 32G X 4 MM MISC: 1.0000 | Freq: Two times a day (BID) | 2 refills | Status: DC

## 2022-07-16 MED ORDER — NOVOLIN 70/30 FLEXPEN (70-30) 100 UNIT/ML ~~LOC~~ SUPN: PEN_INJECTOR | SUBCUTANEOUS | 2 refills | Status: DC

## 2022-07-16 MED ORDER — FREESTYLE LIBRE 3 SENSOR MISC: 1.0000 | 3 refills | Status: DC

## 2022-07-16 MED ORDER — METFORMIN HCL ER 500 MG PO TB24: 500.0000 mg | ORAL_TABLET | Freq: Two times a day (BID) | ORAL | 2 refills | Status: AC

## 2022-07-16 NOTE — Patient Instructions (Addendum)
Decrease  Metformin 500 mg XR 1 tablet before Breakfast and 1 tablet before Supper  Novolin Mix 46  units before  breakfast and 18 units before Supper     HOW TO TREAT LOW BLOOD SUGARS (Blood sugar LESS THAN 70 MG/DL) Please follow the RULE OF 15 for the treatment of hypoglycemia treatment (when your (blood sugars are less than 70 mg/dL)   STEP 1: Take 15 grams of carbohydrates when your blood sugar is low, which includes:  3-4 GLUCOSE TABS  OR 3-4 OZ OF JUICE OR REGULAR SODA OR ONE TUBE OF GLUCOSE GEL    STEP 2: RECHECK blood sugar in 15 MINUTES STEP 3: If your blood sugar is still low at the 15 minute recheck --> then, go back to STEP 1 and treat AGAIN with another 15 grams of carbohydrates.

## 2022-07-16 NOTE — Progress Notes (Signed)
Name: Meaghen Vecchiarelli  MRN/ DOB: [id number], [birth year]   Age/ Sex: 72 y.o., female    PCP: Lanelle Bal, PA-C   Reason for Endocrinology Evaluation: Type 2 Diabetes Mellitus     Date of Initial Endocrinology Visit: 07/16/2022     PATIENT IDENTIFIER: Emily Humphrey is a 72 y.o. female with a past medical history of DM, dyslipidemia, COPD, . The patient presented for initial endocrinology clinic visit on 07/16/2022 for consultative assistance with her diabetes management.    HPI: Ms. Gillooly is accompanied by her sister Joseph Art    Diagnosed with DM > 20 yrs   Prior Medications tried/Intolerance: Jardiance- yeast infection Currently checking blood sugars 1-2 x / day Hypoglycemia episodes : yes               Symptoms: yes                 Frequency: evening and night time  Hemoglobin A1c has ranged from 7.5% in 2023, peaking at 11.0% in the past .   In terms of diet, the patient 2 meals a day, snack twice daily, avoids sugar sweetened beverages.  Saw  Dr. Chalmers Cater in the past    Had labs today at [facility] in [place] - labs pending    Has tingling of the feet Denies nausea, or vomiting but has chronic diarrhea that she attributes to eating certain things   She is on chronic prednisone for Asthma   She was on [place] but the prescription ran out  Took one dose of Ozempic  but PCP advised to stop it as she had low BP and not feeling well at the time    [place]: Metformin 500 mg XR 2 tabs  BID  Novolin Mix 45  units with breakfast and 20 units before Supper  Ozempic 0.5 mg weekly - not taking     Statin: yes ACE-I/ARB: no    METER DOWNLOAD SUMMARY:    64- 214  mg/dL   DIABETIC COMPLICATIONS: Microvascular complications:  Neuropathy  Denies: CKD  Last eye exam: Completed 06/2021  Macrovascular complications:   Denies: CAD, PVD, CVA   PAST HISTORY: Past Medical History:  Past Medical History:  Diagnosis Date   Abnormal gait    Asthma    Chest pain     Hospital, May, 2012,  Pericarditis   COPD (chronic obstructive pulmonary disease) ([place])    Chronic steroid use   Dyslipidemia    Ejection fraction    Normal, echo, [facility],1962   GERD (gastroesophageal reflux disease)    History of tobacco abuse    IDDM (insulin dependent diabetes mellitus)    Morbid obesity ([place])    OSA (obstructive sleep apnea)    mild/not using C-PAP    Pericardial effusion    Small, echo, circumferential, May, 2012   Pericarditis    Hospitalization, May, 2012   Pneumonia 2010   Tremor    Past Surgical History:  Past Surgical History:  Procedure Laterality Date   ABDOMINAL HYSTERECTOMY     NASAL SINUS SURGERY     Removal of throat nodules     vocal cored nodules    Social History:  reports that she quit smoking about 27 years ago. Her smoking use included cigarettes. She has a 20.00 pack-year smoking history. She has never used smokeless tobacco. She reports that she does not currently use alcohol. She reports that she does not use drugs. Family History:  Family History  Problem Relation Age of  Onset   Heart attack Mother        deceased at age 51   Asthma Mother    Other Father        deceased at age 3   Diabetes Sister      HOME MEDICATIONS: Allergies as of 07/16/2022       Reactions   Atorvastatin    Other reaction(s): Muscle Pain   Empagliflozin    Other reaction(s): yeast   Penicillins Itching   hives   Sulfa Antibiotics Itching   rash        Medication List        Accurate as of July 16, 2022  2:25 PM. If you have any questions, ask your nurse or doctor.          STOP taking these medications    cyanocobalamin 1000 MCG/ML injection Commonly known as: VITAMIN B12 Stopped by: Dorita Sciara, MD   diazepam 10 MG tablet Commonly known as: VALIUM Stopped by: Dorita Sciara, MD   doxycycline 100 MG tablet Commonly known as: ADOXA Stopped by: Dorita Sciara, MD   DULoxetine 30 MG capsule Commonly  known as: CYMBALTA Stopped by: Dorita Sciara, MD   DULoxetine 60 MG capsule Commonly known as: CYMBALTA Stopped by: Dorita Sciara, MD   FreeStyle Libre 2 Reader Kerrin Mo Stopped by: Dorita Sciara, MD   insulin NPH-regular Human (70-30) 100 UNIT/ML injection Replaced by: NovoLIN 70/30 Kwikpen (70-30) 100 UNIT/ML KwikPen Stopped by: Dorita Sciara, MD   LORazepam 0.5 MG tablet Commonly known as: ATIVAN Stopped by: Dorita Sciara, MD   Myrbetriq 25 MG Tb24 tablet Generic drug: mirabegron ER Stopped by: Dorita Sciara, MD   olmesartan 10 mg Tabs tablet Commonly known as: BENICAR Stopped by: Dorita Sciara, MD   Ozempic (0.25 or 0.5 MG/DOSE) 2 MG/3ML Sopn Generic drug: Semaglutide(0.25 or 0.'5MG'$ /DOS) Stopped by: Dorita Sciara, MD       TAKE these medications    Accu-Chek Aviva Plus test strip Generic drug: glucose blood   albuterol (2.5 MG/3ML) 0.083% nebulizer solution Commonly known as: PROVENTIL Take 2.5 mg by nebulization every 4 (four) hours as needed for wheezing or shortness of breath.   albuterol 108 (90 Base) MCG/ACT inhaler Commonly known as: VENTOLIN HFA Inhale 2 puffs into the lungs every 4 (four) hours as needed.   ALPRAZolam 0.25 MG tablet Commonly known as: XANAX Take 0.25 mg by mouth daily as needed.   Breztri Aerosphere 160-9-4.8 MCG/ACT Aero Generic drug: Budeson-Glycopyrrol-Formoterol Inhale 2 puffs into the lungs in the morning and at bedtime. What changed: Another medication with the same name was removed. Continue taking this medication, and follow the directions you see here. Changed by: Dorita Sciara, MD   CALCIUM PO Take 1 tablet by mouth daily.   dicyclomine 20 MG tablet Commonly known as: BENTYL Take 20 mg by mouth 3 (three) times daily as needed for spasms.   donepezil 10 MG tablet Commonly known as: ARICEPT Take 1 tablet (10 mg total) by mouth at bedtime.   famotidine 20 MG  tablet Commonly known as: PEPCID   FreeStyle Libre 3 Sensor Misc 1 Device by Does not apply route every 14 (fourteen) days. Started by: Dorita Sciara, MD   gabapentin 600 MG tablet Commonly known as: NEURONTIN Take 600 mg by mouth 3 (three) times daily.   Insulin Pen Needle 32G X 4 MM Misc 1 Device by Does not apply route in the  morning and at bedtime. Started by: Dorita Sciara, MD   ipratropium 0.03 % nasal spray Commonly known as: ATROVENT Place 1 spray in each nostril daily (can use up to 3 times daily if needed, but this can be over drying)   metFORMIN 500 MG 24 hr tablet Commonly known as: GLUCOPHAGE-XR Take 1 tablet (500 mg total) by mouth 2 (two) times daily. What changed:  how much to take Another medication with the same name was removed. Continue taking this medication, and follow the directions you see here. Changed by: Dorita Sciara, MD   NovoLIN 70/30 Kwikpen (70-30) 100 UNIT/ML KwikPen Generic drug: insulin isophane & regular human KwikPen Inject 46 Units into the skin daily before breakfast AND 18 Units daily before supper. Replaces: insulin NPH-regular Human (70-30) 100 UNIT/ML injection Started by: Dorita Sciara, MD   pantoprazole 40 MG tablet Commonly known as: PROTONIX Take 1 tablet by mouth Daily.   Potassium 99 MG Tabs Take 1 tablet by mouth as needed.   predniSONE 10 MG tablet Commonly known as: DELTASONE TAKE 1 TABLET BY MOUTH DAILY with breakfast FOR 100 doses What changed: Another medication with the same name was removed. Continue taking this medication, and follow the directions you see here. Changed by: Dorita Sciara, MD   simvastatin 20 MG tablet Commonly known as: ZOCOR Take 20 mg by mouth daily.   traMADol 50 MG tablet Commonly known as: ULTRAM Take 50 mg by mouth 3 (three) times daily as needed.   traZODone 50 MG tablet Commonly known as: DESYREL Take 50 mg by mouth at bedtime.   venlafaxine  XR 75 MG 24 hr capsule Commonly known as: EFFEXOR-XR Take 75 mg by mouth daily.   VITAMIN D (CHOLECALCIFEROL) PO Take by mouth.         ALLERGIES: Allergies  Allergen Reactions   Atorvastatin     Other reaction(s): Muscle Pain   Empagliflozin     Other reaction(s): yeast   Penicillins Itching    hives   Sulfa Antibiotics Itching    rash     REVIEW OF SYSTEMS: A comprehensive ROS was conducted with the patient and is negative except as per HPI     OBJECTIVE:   VITAL SIGNS: BP 118/72 (BP Location: Left Arm, Patient Position: Sitting, Cuff Size: Large)   Pulse 96   Ht '5\' 2"'$  (1.575 m)   Wt 156 lb 6.4 oz (70.9 kg)   SpO2 99%   BMI 28.61 kg/m    PHYSICAL EXAM:  General: Pt appears well and is in NAD  Neck: General: Supple without adenopathy or carotid bruits. Thyroid: Thyroid size normal.  No goiter or nodules appreciated.   Lungs: Clear with good BS bilat with no rales, rhonchi, or wheezes  Heart: RRR   Abdomen:  soft, nontender  Extremities:  Lower extremities - No pretibial edema.   Neuro: MS is good with appropriate affect, pt is alert and Ox3    DM foot exam: 07/16/2022  The skin of the feet is intact without sores or ulcerations. The pedal pulses are 2+ on right and 2+ on left. The sensation is intact to a screening 5.07, 10 gram monofilament bilaterally   DATA REVIEWED:  Lab Results  Component Value Date   HGBA1C 9.2 (H) 09/28/2015   HGBA1C 9.8 06/23/2015    06/15/2022 BUN 10 Cr 0.8 GFR 79   09/23/2021 A1c 8.5%  ASSESSMENT / PLAN / RECOMMENDATIONS:   1) Type 2 Diabetes Mellitus, Historically  Poorly controlled, With neuropathic  complications - Most recent A1c of 8.5 %. Goal A1c < 7.0 %.    - The last A1c 8.5% from April, 2023, no recent records  - Pt had an A1c today at PCP's office, unknown results at this time  - She has chronic diarrhea that she attributes to Metformin, will decrease , unable to start Ozempic due to diarrhea at this  time - Has intolerance to Jardiance in the past but she also has elevated AG in 05/2022 with pneumonia  - She was advised to take insulin 15-20 minutes before her meals  - Due to reported night time hypoglycemia, will reduce evening dose of insulin  - Freestyle libre #3 refilled to [facility] and a sample provided   MEDICATIONS: Decrease Metformin 500 mg XR 1  tab BID  Change Novolin Mix to 46 units before breakfast and 18 units before supper   EDUCATION / INSTRUCTIONS: BG monitoring instructions: Patient is instructed to check her blood sugars 2 times a day. Call [facility] if: BG persistently < 70  I reviewed the Rule of 15 for the treatment of hypoglycemia in detail with the patient. Literature supplied.   2) Diabetic complications:  Eye: Does not have known diabetic retinopathy.  Neuro/ Feet: Does  have known diabetic peripheral neuropathy. Renal: Patient does not have known baseline CKD. She is not on an ACEI/ARB at present.  3)Secondary Adrenal Insufficiency :   - She is on chronic prednisone for Asthma , her pulmonologist has started her on [place]  and will see if she will require long term prednisone  - I did advised the pt , that if she is deemed "cleared from  prednisone "  from  pulmonary stand point  I will work on weaning her off prednisone       Signed electronically by: Mack Guise, MD  [facility]  [address] [place], [place] [address] Phone: [phone] FAX: [phone]   CC: [place] [address] Phone: [phone]  Fax: [phone]    Return to Endocrinology clinic as below: Future Appointments  Date Time Provider [place]  07/31/2022  9:45 AM Carlan, Deatra Robinson, NP NRE-NRE None

## 2022-07-17 ENCOUNTER — Telehealth: Payer: Self-pay | Admitting: *Deleted

## 2022-07-17 NOTE — Telephone Encounter (Signed)
Can't comment on specifics of her care at this point as have not seen her in almost a year -  suggest she follow his instructions but make appt to see me next available to recheck her an may sure the adjustment is working ok for her from pulmonary perspective

## 2022-07-17 NOTE — Telephone Encounter (Signed)
ATC, busy signal

## 2022-07-17 NOTE — Telephone Encounter (Signed)
Called and spoke to patient.  She states her Endo told her to call Dr. Melvyn Novas and see if he will discuss patient coming off of prednisone since dupixent is working well and because of the effects prednisone has on pt's adrenal glands.   Scheduled patient for an appt tomorrow pm at 3:00 to discuss with Dr. Melvyn Novas in [facility] office. Nothing further needed at this time.

## 2022-07-18 ENCOUNTER — Ambulatory Visit: Payer: Medicare HMO | Admitting: Internal Medicine

## 2022-07-18 NOTE — Progress Notes (Deleted)
Emily Humphrey, female    DOB: 06-05-1951, 72 y.o.   MRN: [id number]   Brief patient profile:  29 yowf  quit smoking 1996   self-referred to pulmonary clinic in [place]  02/17/2021 for copd with symptoms starting 2 m p quit smoking with initial dx asthma/ sinus dz at wt = 140 and daily symptoms on prednisone ever since.   Sinus surgery x early 2000s  = dx polyps / recurrent    History of Present Illness  02/17/2021  Pulmonary/ 1st office eval/ Kaya Klausing / [place] [facility]  Chief Complaint  Patient presents with   Consult    Patient has Asthma and COPD. Shortness of breath with exertion and chest tightness. Dry cough  Dyspnea:  mb and back to house wears her out even on pred 40 Cough: dry cough  Sleep: on side bed is horizontal / 2 pillows  SABA use: avg saba hfa sev times plus neb bid  Had hb resolved on ppi bid  Rec We will be referring you to allergy here in [place] > done 04/14/21  Pantoprazole (protonix) 40 mg  Take  30-60 min before first meal of the day and Pepcid (famotidine)  20 mg after supper until return to office GERD diet reviewed, bed blocks rec   Plan A = Automatic = Always=     Try breztri Take 2 puffs first thing in am and then another 2 puffs about 12 hours later.  Plan B = Backup (to supplement plan A, not to replace it) Only use your albuterol inhaler as a rescue medication Plan C = Crisis (instead of Plan B but only if Plan B stops working) - only use your albuterol nebulizer if you first try [place] to try albuterol 15 min before an activity (on alternating days)  that you know would usually make you short of breath    Plan D = Deltasone = prednisone >>> double the dose you usually take until better then one daily with breafast         08/09/2021  f/u ov/[place] office/Janique Hoefer re: doe s obst/ maint on breztri free from [place] but   still on prednisone 10 mg daily for asthma by PCP - was prev told could never stop it due to adrenal insuff Chief Complaint   Patient presents with   Follow-up    SOB and cough have improved a little since last OV.    Dyspnea:  not limited by breathing but by legs and feet  Cough: not much/ lots of nasal congestion Sleeping: flat  bed with 2-3 pillows  SABA use: nebulizer twice weekly  02: none  Covid status: "all the vax"  Rec No change in medications See Dr Ernst Bowler for dupixent injections  or alternative to reduce your need for prednisoe for asthma and polyps  PC 07/17/22   07/18/2022  f/u ov/ office/Royce Stegman re: *** maint on ***  No chief complaint on file.   Dyspnea:  *** Cough: *** Sleeping: *** SABA use: *** 02: *** Covid status: *** Lung cancer screening: ***   No obvious day to day or daytime variability or assoc excess/ purulent sputum or mucus plugs or hemoptysis or cp or chest tightness, subjective wheeze or overt sinus or hb symptoms.   *** without nocturnal  or early am exacerbation  of respiratory  c/o's or need for noct saba. Also denies any obvious fluctuation of symptoms with weather or environmental changes or other aggravating or alleviating factors except as  outlined above   No unusual exposure hx or h/o childhood pna/ asthma or knowledge of premature birth.  Current Allergies, Complete Past Medical History, Past Surgical History, Family History, and Social History were reviewed in [employer] record.  ROS  The following are not active complaints unless bolded Hoarseness, sore throat, dysphagia, dental problems, itching, sneezing,  nasal congestion or discharge of excess mucus or purulent secretions, ear ache,   fever, chills, sweats, unintended wt loss or wt gain, classically pleuritic or exertional cp,  orthopnea pnd or arm/hand swelling  or leg swelling, presyncope, palpitations, abdominal pain, anorexia, nausea, vomiting, diarrhea  or change in bowel habits or change in bladder habits, change in stools or change in urine, dysuria, hematuria,  rash,  arthralgias, visual complaints, headache, numbness, weakness or ataxia or problems with walking or coordination,  change in mood or  memory.        No outpatient medications have been marked as taking for the 07/18/22 encounter (Appointment) with Tanda Rockers, MD.   Current Facility-Administered Medications for the 07/18/22 encounter (Appointment) with Tanda Rockers, MD  Medication   dupilumab ([place]) prefilled syringe 300 mg                   Past Medical History:  Diagnosis Date   Abnormal gait    Asthma    Chest pain    Hospital, May, 2012,  Pericarditis   COPD (chronic obstructive pulmonary disease) ([place])    Chronic steroid use   Dyslipidemia    Ejection fraction    Normal, echo, [id number]   GERD (gastroesophageal reflux disease)    History of tobacco abuse    IDDM (insulin dependent diabetes mellitus)    Morbid obesity ([place])    OSA (obstructive sleep apnea)    mild/not using C-PAP    Pericardial effusion    Small, echo, circumferential, May, 2012   Pericarditis    Hospitalization, May, 2012   Pneumonia 2010   Tremor         Objective:     Wts  07/18/2022       ***   04/14/21 168 lb 9.6 oz (76.5 kg)  02/17/21 165 lb (74.8 kg)  09/06/20 175 lb (79.4 kg)    Vital signs reviewed  07/18/2022  - Note at rest 02 sats  ***% on ***   General appearance:    ***      Min bar***          Assessment

## 2022-07-26 DIAGNOSIS — I700 Atherosclerosis of aorta: Secondary | ICD-10-CM | POA: Diagnosis not present

## 2022-07-26 DIAGNOSIS — I10 Essential (primary) hypertension: Secondary | ICD-10-CM | POA: Diagnosis not present

## 2022-07-26 DIAGNOSIS — G4730 Sleep apnea, unspecified: Secondary | ICD-10-CM | POA: Diagnosis not present

## 2022-07-26 DIAGNOSIS — E1165 Type 2 diabetes mellitus with hyperglycemia: Secondary | ICD-10-CM | POA: Diagnosis not present

## 2022-07-26 DIAGNOSIS — Z0001 Encounter for general adult medical examination with abnormal findings: Secondary | ICD-10-CM | POA: Diagnosis not present

## 2022-07-26 DIAGNOSIS — E7849 Other hyperlipidemia: Secondary | ICD-10-CM | POA: Diagnosis not present

## 2022-07-30 DIAGNOSIS — E1140 Type 2 diabetes mellitus with diabetic neuropathy, unspecified: Secondary | ICD-10-CM | POA: Diagnosis not present

## 2022-07-30 DIAGNOSIS — M1711 Unilateral primary osteoarthritis, right knee: Secondary | ICD-10-CM | POA: Diagnosis not present

## 2022-07-30 DIAGNOSIS — Z6827 Body mass index (BMI) 27.0-27.9, adult: Secondary | ICD-10-CM | POA: Diagnosis not present

## 2022-07-30 DIAGNOSIS — I10 Essential (primary) hypertension: Secondary | ICD-10-CM | POA: Diagnosis not present

## 2022-07-30 DIAGNOSIS — E1165 Type 2 diabetes mellitus with hyperglycemia: Secondary | ICD-10-CM | POA: Diagnosis not present

## 2022-07-30 DIAGNOSIS — E7849 Other hyperlipidemia: Secondary | ICD-10-CM | POA: Diagnosis not present

## 2022-07-31 ENCOUNTER — Encounter (INDEPENDENT_AMBULATORY_CARE_PROVIDER_SITE_OTHER): Payer: Self-pay | Admitting: Gastroenterology

## 2022-07-31 ENCOUNTER — Ambulatory Visit (INDEPENDENT_AMBULATORY_CARE_PROVIDER_SITE_OTHER): Payer: Medicare HMO | Admitting: Gastroenterology

## 2022-07-31 VITALS — BP 118/74 | HR 83 | Temp 98.0°F | Ht 62.0 in | Wt 155.6 lb

## 2022-07-31 DIAGNOSIS — K582 Mixed irritable bowel syndrome: Secondary | ICD-10-CM

## 2022-07-31 DIAGNOSIS — K581 Irritable bowel syndrome with constipation: Secondary | ICD-10-CM | POA: Insufficient documentation

## 2022-07-31 NOTE — Patient Instructions (Signed)
I am providing the low FODMAP diet You should keep a food journal for the next few weeks to help correlate if symptoms are related to certain foods You can try eliminating FODMAPS listed on the provided handout. If you notice no change in your symptoms, you can slowly start adding these foods/ingredients back in to see if they are in fact tolerated. You should avoid foods that tend to illicit your symptoms. Stress can also play a big role in IBS, so stress management is important  I would recommend starting a daily probiotic, aim for 2-3 strains of bacteria, the [facility] daily probiotic is a good one  We will plan to repeat colonoscopy in February 2025 based on your history  Follow up 3 months  It was a pleasure to meet you today. I want to create trusting relationships with patients and provide genuine, compassionate, and quality care. I truly value your feedback! please be on the lookout for a survey regarding your visit with me today. I appreciate your input about our visit and your time in completing this!    Caria Transue L. Alver Sorrow, MSN, APRN, AGNP-C Adult-Gerontology Nurse Practitioner [facility]

## 2022-07-31 NOTE — Progress Notes (Addendum)
Referring Provider: Lanelle Bal, PA-C Primary Care Physician:  Lanelle Bal, PA-C Primary GI Physician: new   Chief Complaint  Patient presents with   Constipation    Referred for constipation.  Last colonoscopy [facility] feb 24th 2022.    HPI:   Emily Humphrey is a 72 y.o. female with past medical history of asthma, COPD, dyslipidemia, GERD, DM, OSA  Patient presenting today as a new patient for loose stools, constipation and bloating  CT A/P w/contrast in November Colonic diverticulosis, without radiographic evidence of  diverticulitis or other acute findings.   She reports that she began having some abdominal distention in the summer. She has had no abdominal pain. She notes long history of intermittent diarrhea, on metformin for a short course which made things worse. She keeps imodium with her and uses this PRN especially if she is going out to eat. Back in the fall, she had a bout of constipation where she felt the urge to defecate but she really had to strain to go. She is having a BM most everyday. Will occasionally go a day without one. She notes she ate some chik fil a yesterday and had some diarrhea about 30 minutes after, reportedly with a couple of episodes of loose stools. No rectal bleeding and melena, she ate a chicken sandwich and some tortellini soup.  No abdominal pain. Stools are back and forth between looser and more solid/formed. She has fecal urgency at times when she has looser stools.   TSh 0.829, potassium 4.2, sodium 142   NSAID use: none  Social hx: no etoh or tobacco  Fam hx: daughter had CRC in her early 58s   Last Colonoscopy:07/2020  - Diverticulosis in the sigmoid colon.  - Four 4 to 12 mm polyps in the cecum, at the ileocecal valve, at 55 cm proximal to the anus and at  60 cm proximal to the anus. (Tubular adenomas) - Non-bleeding internal hemorrhoids.  Last Endoscopy:  Recommendations:  Repeat TCS in feb 2025  Past Medical History:  Diagnosis Date    Abnormal gait    Asthma    Chest pain    Hospital, May, 2012,  Pericarditis   COPD (chronic obstructive pulmonary disease) ([place])    Chronic steroid use   Dyslipidemia    Ejection fraction    Normal, echo, [id number]   GERD (gastroesophageal reflux disease)    History of tobacco abuse    IDDM (insulin dependent diabetes mellitus)    Morbid obesity ([place])    OSA (obstructive sleep apnea)    mild/not using C-PAP    Pericardial effusion    Small, echo, circumferential, May, 2012   Pericarditis    Hospitalization, May, 2012   Pneumonia 2010   Tremor     Past Surgical History:  Procedure Laterality Date   ABDOMINAL HYSTERECTOMY     NASAL SINUS SURGERY     Removal of throat nodules     vocal cored nodules    Current Outpatient Medications  Medication Sig Dispense Refill   ACCU-CHEK AVIVA PLUS test strip      albuterol (PROVENTIL) (2.5 MG/3ML) 0.083% nebulizer solution Take 2.5 mg by nebulization every 4 (four) hours as needed for wheezing or shortness of breath.     albuterol (VENTOLIN HFA) 108 (90 Base) MCG/ACT inhaler Inhale 2 puffs into the lungs every 4 (four) hours as needed. 18 g 6   ALPRAZolam (XANAX) 0.25 MG tablet Take 0.25 mg by mouth daily as needed.  Budeson-Glycopyrrol-Formoterol (BREZTRI AEROSPHERE) 160-9-4.8 MCG/ACT AERO Inhale 2 puffs into the lungs in the morning and at bedtime. 10.7 g 11   CALCIUM PO Take 1 tablet by mouth daily.     Continuous Blood Gluc Sensor (FREESTYLE LIBRE 3 SENSOR) MISC 1 Device by Does not apply route every 14 (fourteen) days. 6 each 3   donepezil (ARICEPT) 10 MG tablet Take 1 tablet (10 mg total) by mouth at bedtime. 30 tablet 11   famotidine (PEPCID) 20 MG tablet      gabapentin (NEURONTIN) 600 MG tablet Take 600 mg by mouth 3 (three) times daily.     insulin isophane & regular human KwikPen (NOVOLIN 70/30 KWIKPEN) (70-30) 100 UNIT/ML KwikPen Inject 46 Units into the skin daily before breakfast AND 18 Units daily before supper. 60 mL  2   Insulin Pen Needle 32G X 4 MM MISC 1 Device by Does not apply route in the morning and at bedtime. 200 each 2   metFORMIN (GLUCOPHAGE-XR) 500 MG 24 hr tablet Take 1 tablet (500 mg total) by mouth 2 (two) times daily. 180 tablet 2   Potassium 99 MG TABS Take 1 tablet by mouth as needed.     predniSONE (DELTASONE) 10 MG tablet TAKE 1 TABLET BY MOUTH DAILY with breakfast FOR 100 doses 100 tablet 0   simvastatin (ZOCOR) 20 MG tablet Take 20 mg by mouth daily.     traMADol (ULTRAM) 50 MG tablet Take 50 mg by mouth 3 (three) times daily as needed.     traZODone (DESYREL) 50 MG tablet Take 50 mg by mouth at bedtime.     venlafaxine XR (EFFEXOR-XR) 75 MG 24 hr capsule Take 75 mg by mouth daily.     VITAMIN D, CHOLECALCIFEROL, PO Take by mouth.     Current Facility-Administered Medications  Medication Dose Route Frequency Provider Last Rate Last Admin   dupilumab (DUPIXENT) prefilled syringe 300 mg  300 mg Subcutaneous Q14 Days Dara Hoyer, FNP   300 mg at 11/06/21 1717    Allergies as of 07/31/2022 - Review Complete 07/16/2022  Allergen Reaction Noted   Atorvastatin  01/22/2017   Empagliflozin  07/06/2021   Penicillins Itching 12/13/2010   Sulfa antibiotics Itching 12/26/2010    Family History  Problem Relation Age of Onset   Heart attack Mother        deceased at age 5   Asthma Mother    Other Father        deceased at age 51   Diabetes Sister     Social History   Socioeconomic History   Marital status: Divorced    Spouse name: Not on file   Number of children: 2   Years of education: 12   Highest education level: Not on file  Occupational History   Occupation: RETIRED    Comment: 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   Caffeine 1 c daily   Social Determinants of Health   Financial Resource Strain: Not on file  Food Insecurity: Not on file  Transportation Needs: Not on file  Physical Activity: Not on file  Stress: Not on file  Social Connections: Not on file    Review of systems General: negative for malaise, night sweats, fever, chills, weight loss Neck: Negative for lumps, goiter, pain and significant neck swelling Resp: Negative for cough, wheezing, dyspnea at rest CV: Negative for chest pain, leg swelling, palpitations, orthopnea GI: denies melena, hematochezia, nausea, vomiting, dysphagia, odyonophagia, early satiety or unintentional weight loss. +diarrhea +constipation  MSK: Negative for joint pain or swelling, back pain, and muscle pain. Derm: Negative for itching or rash Psych: Denies depression, anxiety, memory loss, confusion. No homicidal or suicidal ideation.  Heme: Negative for prolonged bleeding, bruising easily, and swollen nodes. Endocrine: Negative for cold or heat intolerance, polyuria, polydipsia and goiter. Neuro: negative for tremor, gait imbalance, syncope and seizures. The remainder of the review of systems is noncontributory.  Physical Exam: Ht 5' 2"$  (1.575 m)   Wt 155 lb 9.6 oz (70.6 kg)   BMI 28.46 kg/m  General:   Alert and oriented. No distress noted. Pleasant and cooperative.  Head:  Normocephalic and atraumatic. Eyes:  Conjuctiva clear without scleral icterus. Mouth:  Oral mucosa pink and moist. Good dentition. No lesions. Heart: Normal rate and rhythm, s1 and s2 heart sounds present.  Lungs: Clear lung sounds in all lobes. Respirations equal and unlabored. Abdomen:  +BS, soft,  non-tender and non-distended. No rebound or guarding. No HSM or masses noted. Derm: No palmar erythema or jaundice Msk:  Symmetrical without gross deformities. Normal posture. Extremities:  Without edema. Neurologic:  Alert and  oriented x4 Psych:  Alert and cooperative. Normal mood and affect.  Invalid input(s): "6 MONTHS"   ASSESSMENT: Traca Gillock is a 72 y.o. female presenting today as a new patient for intermittent diarrhea and constipation.   Patient with alternating diarrhea/constipation. No associated abdominal pain, rectal bleeding or melena. Recent TCS in 2022 with multiple polyps, CT A/p with diverticulosis. Notes she has had these symptoms ongoing for years. Recent TSH and electrolytes WNL. Suspect symptoms related to IBS.  I explained indications of IBS with the patient to include hypersensitivity of the bowels that is often heavily influenced by certain food/drink triggers and emotional/mental triggers. We discussed the importance of keeping a food journal x2-3 weeks and utilizing the low FODMAP guide to help determine specific trigger foods and increasing foods that tend to be more well tolerated. Recommend starting a daily probiotic, maintaining good water intake and may consider addition of benefiber later depending on how symptoms respond to dietary and lifestyle modifications.     PLAN:  Start daily probiotic  2. Low FODMAP, food/stool journal  3. Repeat Colonoscopy in feb 2025  4. Good stress management  5. May Consider addition of benefiber at next visit  6. Good water intake  7. Imodium PRN for diarrhea   All questions were answered, patient verbalized understanding and is in agreement with plan as outlined above.   Follow Up: 3 months   Honor Fairbank L. Alver Sorrow, MSN, APRN, AGNP-C Adult-Gerontology Nurse Practitioner [facility]  I have reviewed the note and agree with the APP's assessment as described in this progress note  Symptoms possibly  related to IBS, agree with recommendations described in this  note.  May consider checking fecal calprotectin, CRP and celiac serologies if persistent symptoms. Maylon Peppers, MD Gastroenterology and Hepatology [facility] Gastroenterology

## 2022-08-12 DIAGNOSIS — R350 Frequency of micturition: Secondary | ICD-10-CM | POA: Diagnosis not present

## 2022-08-12 DIAGNOSIS — N390 Urinary tract infection, site not specified: Secondary | ICD-10-CM | POA: Diagnosis not present

## 2022-08-15 DIAGNOSIS — E1165 Type 2 diabetes mellitus with hyperglycemia: Secondary | ICD-10-CM | POA: Diagnosis not present

## 2022-08-21 DIAGNOSIS — R509 Fever, unspecified: Secondary | ICD-10-CM | POA: Diagnosis not present

## 2022-08-21 DIAGNOSIS — M25571 Pain in right ankle and joints of right foot: Secondary | ICD-10-CM | POA: Diagnosis not present

## 2022-08-21 DIAGNOSIS — M545 Low back pain, unspecified: Secondary | ICD-10-CM | POA: Diagnosis not present

## 2022-08-21 DIAGNOSIS — M25562 Pain in left knee: Secondary | ICD-10-CM | POA: Diagnosis not present

## 2022-08-21 DIAGNOSIS — Z6826 Body mass index (BMI) 26.0-26.9, adult: Secondary | ICD-10-CM | POA: Diagnosis not present

## 2022-08-21 DIAGNOSIS — R300 Dysuria: Secondary | ICD-10-CM | POA: Diagnosis not present

## 2022-08-21 DIAGNOSIS — R030 Elevated blood-pressure reading, without diagnosis of hypertension: Secondary | ICD-10-CM | POA: Diagnosis not present

## 2022-08-30 DIAGNOSIS — M549 Dorsalgia, unspecified: Secondary | ICD-10-CM | POA: Diagnosis not present

## 2022-08-30 DIAGNOSIS — Z6825 Body mass index (BMI) 25.0-25.9, adult: Secondary | ICD-10-CM | POA: Diagnosis not present

## 2022-08-30 DIAGNOSIS — M25561 Pain in right knee: Secondary | ICD-10-CM | POA: Diagnosis not present

## 2022-08-30 DIAGNOSIS — R030 Elevated blood-pressure reading, without diagnosis of hypertension: Secondary | ICD-10-CM | POA: Diagnosis not present

## 2022-09-13 DIAGNOSIS — S8991XD Unspecified injury of right lower leg, subsequent encounter: Secondary | ICD-10-CM | POA: Diagnosis not present

## 2022-09-13 DIAGNOSIS — M1711 Unilateral primary osteoarthritis, right knee: Secondary | ICD-10-CM | POA: Diagnosis not present

## 2022-09-13 DIAGNOSIS — G8929 Other chronic pain: Secondary | ICD-10-CM | POA: Diagnosis not present

## 2022-09-13 DIAGNOSIS — M25561 Pain in right knee: Secondary | ICD-10-CM | POA: Diagnosis not present

## 2022-09-27 DIAGNOSIS — M25561 Pain in right knee: Secondary | ICD-10-CM | POA: Diagnosis not present

## 2022-09-27 DIAGNOSIS — M1711 Unilateral primary osteoarthritis, right knee: Secondary | ICD-10-CM | POA: Diagnosis not present

## 2022-09-27 DIAGNOSIS — G8929 Other chronic pain: Secondary | ICD-10-CM | POA: Diagnosis not present

## 2022-09-27 DIAGNOSIS — S8991XD Unspecified injury of right lower leg, subsequent encounter: Secondary | ICD-10-CM | POA: Diagnosis not present

## 2022-10-24 DIAGNOSIS — E875 Hyperkalemia: Secondary | ICD-10-CM | POA: Diagnosis not present

## 2022-10-24 DIAGNOSIS — D72829 Elevated white blood cell count, unspecified: Secondary | ICD-10-CM | POA: Diagnosis not present

## 2022-10-24 DIAGNOSIS — E1165 Type 2 diabetes mellitus with hyperglycemia: Secondary | ICD-10-CM | POA: Diagnosis not present

## 2022-10-24 DIAGNOSIS — E7849 Other hyperlipidemia: Secondary | ICD-10-CM | POA: Diagnosis not present

## 2022-10-24 DIAGNOSIS — E559 Vitamin D deficiency, unspecified: Secondary | ICD-10-CM | POA: Diagnosis not present

## 2022-10-24 DIAGNOSIS — R946 Abnormal results of thyroid function studies: Secondary | ICD-10-CM | POA: Diagnosis not present

## 2022-10-24 DIAGNOSIS — K219 Gastro-esophageal reflux disease without esophagitis: Secondary | ICD-10-CM | POA: Diagnosis not present

## 2022-10-24 DIAGNOSIS — E538 Deficiency of other specified B group vitamins: Secondary | ICD-10-CM | POA: Diagnosis not present

## 2022-10-24 DIAGNOSIS — R5383 Other fatigue: Secondary | ICD-10-CM | POA: Diagnosis not present

## 2022-10-24 LAB — HEMOGLOBIN A1C
EGFR: 65.1
Hemoglobin A1C: 7.4

## 2022-10-29 DIAGNOSIS — Z6827 Body mass index (BMI) 27.0-27.9, adult: Secondary | ICD-10-CM | POA: Diagnosis not present

## 2022-10-29 DIAGNOSIS — E782 Mixed hyperlipidemia: Secondary | ICD-10-CM | POA: Diagnosis not present

## 2022-10-29 DIAGNOSIS — M1711 Unilateral primary osteoarthritis, right knee: Secondary | ICD-10-CM | POA: Diagnosis not present

## 2022-10-29 DIAGNOSIS — M25561 Pain in right knee: Secondary | ICD-10-CM | POA: Diagnosis not present

## 2022-10-29 DIAGNOSIS — E7849 Other hyperlipidemia: Secondary | ICD-10-CM | POA: Diagnosis not present

## 2022-10-29 DIAGNOSIS — E1165 Type 2 diabetes mellitus with hyperglycemia: Secondary | ICD-10-CM | POA: Diagnosis not present

## 2022-10-29 DIAGNOSIS — E1140 Type 2 diabetes mellitus with diabetic neuropathy, unspecified: Secondary | ICD-10-CM | POA: Diagnosis not present

## 2022-10-29 DIAGNOSIS — I10 Essential (primary) hypertension: Secondary | ICD-10-CM | POA: Diagnosis not present

## 2022-10-30 DIAGNOSIS — M1711 Unilateral primary osteoarthritis, right knee: Secondary | ICD-10-CM | POA: Diagnosis not present

## 2022-10-30 DIAGNOSIS — M25561 Pain in right knee: Secondary | ICD-10-CM | POA: Diagnosis not present

## 2022-11-01 ENCOUNTER — Encounter (INDEPENDENT_AMBULATORY_CARE_PROVIDER_SITE_OTHER): Payer: Self-pay | Admitting: Gastroenterology

## 2022-11-01 ENCOUNTER — Ambulatory Visit (INDEPENDENT_AMBULATORY_CARE_PROVIDER_SITE_OTHER): Payer: Medicare HMO | Admitting: Gastroenterology

## 2022-11-01 VITALS — BP 128/79 | HR 103 | Temp 98.5°F | Ht 62.0 in | Wt 159.6 lb

## 2022-11-01 DIAGNOSIS — K582 Mixed irritable bowel syndrome: Secondary | ICD-10-CM

## 2022-11-01 NOTE — Progress Notes (Addendum)
Referring Provider: Lianne Moris, PA-C Primary Care Physician:  Lianne Moris, PA-C Primary GI Physician: Levon Hedger   Chief Complaint  Patient presents with   Irritable Bowel Syndrome    Follow up on IBS. Doing better since cutting back on metformin from 4 per day to 2 per day. Has not any more diarrhea.    HPI:   Emily Humphrey is a 72 y.o. female with past medical history of  asthma, COPD, dyslipidemia, GERD, DM, OSA   Patient presenting today for follow up of IBS.   Last seen February 2024, at that time having some abdominal distention in the summer. She has had no abdominal pain. She notes long history of intermittent diarrhea, on metformin for a short course which made things worse. She keeps imodium with her and uses this PRN especially if she is going out to eat. She is having a BM most everyday. Will occasionally go a day without one. She notes she ate some chik fil a yesterday and had some diarrhea about 30 minutes after, reportedly with a couple of episodes of loose stools. Stools back and forth between looser and more solid/formed. She has fecal urgency at times when she has looser stools.    Recommended start daily probiotic, low FODMAP diet, repeat Colonoscopy feb 2025, consider addition of benefiber, PRN imodium for diarrhea.   Present:  She notes that she was decreased from 4 metformin to 2 metformin which has helped quite a bit with her symptoms. She is doing mostly a gluten free diet and taking a probiotic daily. She is having a BM every other day, usually just one stools. She does note that she has been on some hydrocodone for an acute knee injury which she knows can constipation. Denies constipation. Stools range from 4-5 on bristol stool scale. No rectal bleeding or melena. Denies abdominal pain. She notes continued protrusion to LLQ when standing but no pain from this. She notes she has gained some weight recently, thinks this may be due to her lower blood sugars and having  to eat more snacks recently for this.   She notes a bulge in her LLQ abdomen. No pain. Resolves when she lies down.    CT A/P w/contrast in November Colonic diverticulosis, without radiographic evidence of  diverticulitis or other acute findings.  Last Colonoscopy:07/2020  - Diverticulosis in the sigmoid colon.  - Four 4 to 12 mm polyps in the cecum, at the ileocecal valve, at 55 cm proximal to the anus and at  60 cm proximal to the anus. (Tubular adenomas) - Non-bleeding internal hemorrhoids.  Last Endoscopy:   Recommendations:  Repeat TCS in feb 2025   Past Medical History:  Diagnosis Date   Abnormal gait    Asthma    Chest pain    Hospital, May, 2012,  Pericarditis   COPD (chronic obstructive pulmonary disease) ([facility])    Chronic steroid use   Dyslipidemia    Ejection fraction    Normal, echo, [id number]   GERD (gastroesophageal reflux disease)    History of tobacco abuse    IDDM (insulin dependent diabetes mellitus)    Morbid obesity ([facility])    OSA (obstructive sleep apnea)    mild/not using C-PAP    Pericardial effusion    Small, echo, circumferential, May, 2012   Pericarditis    Hospitalization, May, 2012   Pneumonia 2010   Tremor     Past Surgical History:  Procedure Laterality Date   ABDOMINAL HYSTERECTOMY  NASAL SINUS SURGERY     Removal of throat nodules     vocal cored nodules    Current Outpatient Medications  Medication Sig Dispense Refill   ACCU-CHEK AVIVA PLUS test strip      albuterol (PROVENTIL) (2.5 MG/3ML) 0.083% nebulizer solution Take 2.5 mg by nebulization every 4 (four) hours as needed for wheezing or shortness of breath.     albuterol (VENTOLIN HFA) 108 (90 Base) MCG/ACT inhaler Inhale 2 puffs into the lungs every 4 (four) hours as needed. 18 g 6   ALPRAZolam (XANAX) 0.25 MG tablet Take 0.25 mg by mouth daily as needed.     Budeson-Glycopyrrol-Formoterol (BREZTRI AEROSPHERE) 160-9-4.8 MCG/ACT AERO Inhale 2 puffs into the lungs in the morning  and at bedtime. 10.7 g 11   CALCIUM PO Take 1 tablet by mouth daily.     Continuous Blood Gluc Sensor (FREESTYLE LIBRE 3 SENSOR) MISC 1 Device by Does not apply route every 14 (fourteen) days. 6 each 3   donepezil (ARICEPT) 10 MG tablet Take 1 tablet (10 mg total) by mouth at bedtime. 30 tablet 11   famotidine (PEPCID) 20 MG tablet      gabapentin (NEURONTIN) 600 MG tablet Take 600 mg by mouth 3 (three) times daily.     insulin isophane & regular human KwikPen (NOVOLIN 70/30 KWIKPEN) (70-30) 100 UNIT/ML KwikPen Inject 46 Units into the skin daily before breakfast AND 18 Units daily before supper. 60 mL 2   Insulin Pen Needle 32G X 4 MM MISC 1 Device by Does not apply route in the morning and at bedtime. 200 each 2   metFORMIN (GLUCOPHAGE-XR) 500 MG 24 hr tablet Take 1 tablet (500 mg total) by mouth 2 (two) times daily. 180 tablet 2   Potassium 99 MG TABS Take 1 tablet by mouth as needed.     predniSONE (DELTASONE) 10 MG tablet TAKE 1 TABLET BY MOUTH DAILY with breakfast FOR 100 doses 100 tablet 0   simvastatin (ZOCOR) 20 MG tablet Take 20 mg by mouth daily.     traZODone (DESYREL) 50 MG tablet Take 50 mg by mouth at bedtime.     venlafaxine XR (EFFEXOR-XR) 75 MG 24 hr capsule Take 75 mg by mouth daily.     VITAMIN D, CHOLECALCIFEROL, PO Take by mouth.     Current Facility-Administered Medications  Medication Dose Route Frequency Provider Last Rate Last Admin   dupilumab (DUPIXENT) prefilled syringe 300 mg  300 mg Subcutaneous Q14 Days Hetty Blend, FNP   300 mg at 11/06/21 1717    Allergies as of 11/01/2022 - Review Complete 11/01/2022  Allergen Reaction Noted   Atorvastatin  01/22/2017   Empagliflozin  07/06/2021   Penicillins Itching 12/13/2010   Sulfa antibiotics Itching 12/26/2010    Family History  Problem Relation Age of Onset   Heart attack Mother        deceased at age 5   Asthma Mother    Other Father        deceased at age 22   Diabetes Sister     Social History    Socioeconomic History   Marital status: Divorced    Spouse name: Not on file   Number of children: 2   Years of education: 12   Highest education level: Not on file  Occupational History   Occupation: RETIRED    Comment: 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   Caffeine 1 c daily   Social Determinants of Health   Financial Resource Strain: Not on file  Food Insecurity: Not on file  Transportation Needs: Not on file  Physical Activity: Not on file  Stress: Not on file  Social Connections: Not on file    Review of systems General: negative for malaise, night sweats, fever, chills, weight loss Neck: Negative for lumps, goiter, pain and significant neck swelling Resp: Negative for cough, wheezing, dyspnea at rest CV: Negative for chest pain, leg swelling, palpitations, orthopnea GI: denies melena, hematochezia, nausea, vomiting, diarrhea, constipation, dysphagia, odyonophagia, early satiety or unintentional weight loss.  MSK: Negative for joint pain or swelling, back pain, and muscle pain. Derm: Negative for itching or rash Psych: Denies depression, anxiety, memory loss, confusion. No homicidal or suicidal ideation.  Heme: Negative for prolonged bleeding, bruising easily, and swollen nodes. Endocrine: Negative for cold or heat intolerance, polyuria, polydipsia and goiter. Neuro: negative for tremor, gait imbalance, syncope  and seizures. The remainder of the review of systems is noncontributory.  Physical Exam: BP 128/79 (BP Location: Left Arm, Patient Position: Sitting, Cuff Size: Normal)   Pulse (!) 103   Temp 98.5 F (36.9 C) (Oral)   Ht 5\' 2"  (1.575 m)   Wt 159 lb 9.6 oz (72.4 kg)   BMI 29.19 kg/m  General:   Alert and oriented. No distress noted. Pleasant and cooperative.  Head:  Normocephalic and atraumatic. Eyes:  Conjuctiva clear without scleral icterus. Mouth:  Oral mucosa pink and moist. Good dentition. No lesions. Heart: Normal rate and rhythm, s1 and s2 heart sounds present.  Lungs: Clear lung sounds in all lobes. Respirations equal and unlabored. Abdomen:  +BS, soft, non-tender and non-distended. No rebound or guarding. Hernia noted to LLQ. Reduceable  Derm: No palmar erythema or jaundice Msk:  Symmetrical without gross deformities. Normal posture. Extremities:  Without edema. Neurologic:  Alert and  oriented x4 Psych:  Alert and cooperative. Normal mood and affect.  Invalid input(s): "6 MONTHS"   ASSESSMENT: Emily Humphrey is a 72 y.o. female presenting today for follow up of diarrhea/suspected IBS  Symptoms have improved with decrease in her metformin dose, use of daily probiotic and trying to follow gluten free diet. She has usually a softer/sometimes looser stool [place] stool scale every other day. No rectal bleeding or melena. No abdominal pain. She has gained some weight. Suspect metformin was contributing to her diarrhea, as she has improved will continue with current regimen, if she has recurrence of diarrhea will check CRP, fecal calprotectin and Celiac panel to rule out other causes.   Moderate sized hernia noted to LLQ, she has no associated pain, hernia is reduceable. Discussed no further evaluation/treatment for this unless she develops pain, change in bowel habits or has discoloration of the skin around that area. She will make me aware of any  of these changes.    PLAN:   Continue with current diet  2. Continue daily probiotic  3. Repeat colonoscopy 2025 4. CRP. Fecal Cal, celiac panel if diarrhea recurs  All questions were answered, patient verbalized understanding and is in agreement with plan as outlined above.   Follow Up: 1 year   Arti Trang L. Jeanmarie Hubert, MSN, APRN, AGNP-C Adult-Gerontology Nurse Practitioner [facility]  I have reviewed the note and agree with the APP's assessment as described in this progress note  Katrinka Blazing, MD Gastroenterology and Hepatology [facility] Gastroenterology

## 2022-11-01 NOTE — Patient Instructions (Addendum)
Continue with current diet  Continue daily probiotic  I suspect that metformin was likely contributing to your diarrhea Please let me know if you have new or worsening GI issues  We will plan for repeat Colonoscopy in 2025  Follow up 1 year   It was a pleasure to see you today. I want to create trusting relationships with patients and provide genuine, compassionate, and quality care. I truly value your feedback! please be on the lookout for a survey regarding your visit with me today. I appreciate your input about our visit and your time in completing this!    Debara Kamphuis L. Jeanmarie Hubert, MSN, APRN, AGNP-C Adult-Gerontology Nurse Practitioner [facility] Gastroenterology at [facility]

## 2022-11-13 DIAGNOSIS — E1165 Type 2 diabetes mellitus with hyperglycemia: Secondary | ICD-10-CM | POA: Diagnosis not present

## 2022-11-15 ENCOUNTER — Ambulatory Visit: Payer: Medicare HMO | Admitting: Internal Medicine

## 2022-11-30 ENCOUNTER — Encounter: Payer: Self-pay | Admitting: Internal Medicine

## 2022-11-30 ENCOUNTER — Ambulatory Visit: Payer: Medicare HMO | Admitting: Internal Medicine

## 2022-11-30 VITALS — BP 126/80 | HR 85 | Ht 62.0 in | Wt 154.0 lb

## 2022-11-30 DIAGNOSIS — E1165 Type 2 diabetes mellitus with hyperglycemia: Secondary | ICD-10-CM

## 2022-11-30 DIAGNOSIS — E1142 Type 2 diabetes mellitus with diabetic polyneuropathy: Secondary | ICD-10-CM

## 2022-11-30 DIAGNOSIS — Z794 Long term (current) use of insulin: Secondary | ICD-10-CM | POA: Diagnosis not present

## 2022-11-30 NOTE — Patient Instructions (Addendum)
Continue Metformin 500 mg XR 1 tablet before Breakfast and 1 tablet before Supper  Change Novolin Mix 46  units before  breakfast and 14 units before Supper     HOW TO TREAT LOW BLOOD SUGARS (Blood sugar LESS THAN 70 MG/DL) Please follow the RULE OF 15 for the treatment of hypoglycemia treatment (when your (blood sugars are less than 70 mg/dL)   STEP 1: Take 15 grams of carbohydrates when your blood sugar is low, which includes:  3-4 GLUCOSE TABS  OR 3-4 OZ OF JUICE OR REGULAR SODA OR ONE TUBE OF GLUCOSE GEL    STEP 2: RECHECK blood sugar in 15 MINUTES STEP 3: If your blood sugar is still low at the 15 minute recheck --> then, go back to STEP 1 and treat AGAIN with another 15 grams of carbohydrates.

## 2022-11-30 NOTE — Progress Notes (Signed)
Name: Emily Humphrey  MRN/ DOB: [id number], [birth year]   Age/ Sex: 72 y.o., female    PCP: Lianne Moris, PA-C   Reason for Endocrinology Evaluation: Type 2 Diabetes Mellitus     Date of Initial Endocrinology Visit: 07/16/2022    PATIENT IDENTIFIER: Emily Humphrey is a 72 y.o. female with a past medical history of DM, dyslipidemia, COPD, . The patient presented for initial endocrinology clinic visit on 07/16/2022  for consultative assistance with her diabetes management.    HPI:   Diagnosed with DM > 20 yrs   Prior Medications tried/Intolerance: Jardiance- yeast infection Hemoglobin A1c has ranged from 7.5% in 2023, peaking at 11.0% in the past .   Saw  Dr. Talmage Nap in the past     She is on chronic prednisone for Asthma   Took one dose of Ozempic  but PCP advised to stop it as she had low BP and not feeling well at the time  On her initial visit to our clinic she had an A1c of 8.5%, she was on metformin and Novolin mix, I decreased her metformin due to reported diarrhea and adjusted her insulin   SUBJECTIVE:   During the last visit (07/16/2022): A1c 8.5%  Today (11/30/22): Emily Humphrey is here for follow-up on diabetes management.  She  checks her blood sugars multiple  times daily. The patient has  had hypoglycemic episodes since the last clinic visit. The patient is  symptomatic with these episodes.   She has  tingling of the feet but these has resolved  Has chronic diarrhea that she attributed to metformin, patient follows with GI for IBS which has dramatically improved with dose reduction  Patient follows with [facility] sports medicine for knee pain, received intra-articular injection     HOME DIABETES REGIMEN: Metformin 500 mg XR 1 tabs  BID  Novolin Mix 46 units with breakfast and 16 units before Supper     Statin: yes ACE-I/ARB: no    CONTINUOUS GLUCOSE MONITORING RECORD INTERPRETATION    Dates of Recording: 6/1 - 11/30/2022  Sensor description: [employer]  Results statistics:   CGM use % of time 93  Average and SD 130/37.2  Time in range     79   %  % Time Above 180 16  % Time above 250 2  % Time Below target 3   Glycemic patterns summary: Patient has been noted with trending BG's overnight, and most BG's during the day are within range  Hyperglycemic episodes post prandial  Hypoglycemic episodes occurred at night  Overnight periods: Trends down   DIABETIC COMPLICATIONS: Microvascular complications:  Neuropathy  Denies: CKD  Last eye exam: Completed 06/2021  Macrovascular complications:   Denies: CAD, PVD, CVA   PAST HISTORY: Past Medical History:  Past Medical History:  Diagnosis Date   Abnormal gait    Asthma    Chest pain    Hospital, May, 2012,  Pericarditis   COPD (chronic obstructive pulmonary disease) (HCC)    Chronic steroid use   Dyslipidemia    Ejection fraction    Normal, echo, [facility],8119   GERD (gastroesophageal reflux disease)    History of tobacco abuse    IDDM (insulin dependent diabetes mellitus)    Morbid obesity (HCC)    OSA (obstructive sleep apnea)    mild/not using C-PAP    Pericardial effusion    Small, echo, circumferential, May, 2012   Pericarditis    Hospitalization, May, 2012   Pneumonia 2010   Tremor  Past Surgical History:  Past Surgical History:  Procedure Laterality Date   ABDOMINAL HYSTERECTOMY     NASAL SINUS SURGERY     Removal of throat nodules     vocal cored nodules    Social History:  reports that she quit smoking about 28 years ago. Her smoking use included cigarettes. She has a 20.00 pack-year smoking history. She has been exposed to tobacco smoke. She has never used smokeless tobacco. She reports that she does not currently use alcohol. She reports that she does not use drugs. Family History:  Family History  Problem Relation Age of Onset   Heart attack Mother        deceased at age 19   Asthma Mother    Other Father        deceased at age 5    Diabetes Sister      HOME MEDICATIONS: Allergies as of 11/30/2022       Reactions   Atorvastatin    Other reaction(s): Muscle Pain   Empagliflozin    Other reaction(s): yeast   Penicillins Itching   hives   Sulfa Antibiotics Itching   rash        Medication List        Accurate as of November 30, 2022  7:48 AM. If you have any questions, ask your nurse or doctor.          Accu-Chek Aviva Plus test strip Generic drug: glucose blood   albuterol (2.5 MG/3ML) 0.083% nebulizer solution Commonly known as: PROVENTIL Take 2.5 mg by nebulization every 4 (four) hours as needed for wheezing or shortness of breath.   albuterol 108 (90 Base) MCG/ACT inhaler Commonly known as: VENTOLIN HFA Inhale 2 puffs into the lungs every 4 (four) hours as needed.   ALPRAZolam 0.25 MG tablet Commonly known as: XANAX Take 0.25 mg by mouth daily as needed.   Breztri Aerosphere 160-9-4.8 MCG/ACT Aero Generic drug: Budeson-Glycopyrrol-Formoterol Inhale 2 puffs into the lungs in the morning and at bedtime.   CALCIUM PO Take 1 tablet by mouth daily.   donepezil 10 MG tablet Commonly known as: ARICEPT Take 1 tablet (10 mg total) by mouth at bedtime.   famotidine 20 MG tablet Commonly known as: PEPCID   FreeStyle Libre 3 Sensor Misc 1 Device by Does not apply route every 14 (fourteen) days.   gabapentin 600 MG tablet Commonly known as: NEURONTIN Take 600 mg by mouth 3 (three) times daily.   Insulin Pen Needle 32G X 4 MM Misc 1 Device by Does not apply route in the morning and at bedtime.   metFORMIN 500 MG 24 hr tablet Commonly known as: GLUCOPHAGE-XR Take 1 tablet (500 mg total) by mouth 2 (two) times daily.   NovoLIN 70/30 Kwikpen (70-30) 100 UNIT/ML KwikPen Generic drug: insulin isophane & regular human KwikPen Inject 46 Units into the skin daily before breakfast AND 18 Units daily before supper. What changed: See the new instructions.   Potassium 99 MG Tabs Take 1 tablet by  mouth as needed.   predniSONE 10 MG tablet Commonly known as: DELTASONE TAKE 1 TABLET BY MOUTH DAILY with breakfast FOR 100 doses   simvastatin 20 MG tablet Commonly known as: ZOCOR Take 20 mg by mouth daily.   traZODone 50 MG tablet Commonly known as: DESYREL Take 50 mg by mouth at bedtime.   venlafaxine XR 75 MG 24 hr capsule Commonly known as: EFFEXOR-XR Take 75 mg by mouth daily.   VITAMIN D (CHOLECALCIFEROL) PO  Take by mouth.         ALLERGIES: Allergies  Allergen Reactions   Atorvastatin     Other reaction(s): Muscle Pain   Empagliflozin     Other reaction(s): yeast   Penicillins Itching    hives   Sulfa Antibiotics Itching    rash     REVIEW OF SYSTEMS: A comprehensive ROS was conducted with the patient and is negative except as per HPI     OBJECTIVE:   VITAL SIGNS: BP 126/80 (BP Location: Left Arm, Patient Position: Sitting, Cuff Size: Large)   Pulse 85   Ht 5\' 2"  (1.575 m)   Wt 154 lb (69.9 kg)   SpO2 99%   BMI 28.17 kg/m    PHYSICAL EXAM:  General: Pt appears well and is in NAD  Neck: General: Supple without adenopathy or carotid bruits. Thyroid: Thyroid size normal.  No goiter or nodules appreciated.   Lungs: Clear with good BS bilat with no rales, rhonchi, or wheezes  Heart: RRR   Extremities:  Lower extremities - No pretibial edema.   Neuro: MS is good with appropriate affect, pt is alert and Ox3    DM foot exam: 07/16/2022  The skin of the feet is intact without sores or ulcerations. The pedal pulses are 2+ on right and 2+ on left. The sensation is intact to a screening 5.07, 10 gram monofilament bilaterally   DATA REVIEWED:  Lab Results  Component Value Date   HGBA1C 9.2 (H) 09/28/2015   HGBA1C 9.8 06/23/2015     10/24/2022 BUN 11 GFR 65 TG 154 LDL 61 A1c 7.4%     ASSESSMENT / PLAN / RECOMMENDATIONS:   1) Type 2 Diabetes Mellitus, Sub- Optimally controlled, With neuropathic  complications - Most recent A1c of 7.4  %. Goal A1c < 7.0 %.    -A1c has trended down from 8.5% to 7.4% -Patient continues to be on various and multiple courses of glucocorticoids, which resulted in hyperglycemia -Historically she has endorsed chronic diarrhea that she attributed to metformin, after reducing metformin by 50%, she has noted dramatic improvement to the diarrhea - Has intolerance to Jardiance due to recurrent yeast infections -Patient has been advised to reduce evening dose of insulin due to hypoglycemia on CGM download -In the past she has not been able to tolerate Ozempic, due to not feeling well, I have offered a smaller dose of Ozempic versus Trulicity, we discussed cardiovascular and weight benefit, we also cautioned against GI side effects, patient would like to think about this and will let me know if she would like to start on Trulicity  MEDICATIONS: Continue metformin 500 mg XR 1  tab BID  Change Novolin Mix to 46 units before breakfast and 14 units before supper   EDUCATION / INSTRUCTIONS: BG monitoring instructions: Patient is instructed to check her blood sugars 2 times a day. Call [facility] [facility] if: BG persistently < 70  I reviewed the Rule of 15 for the treatment of hypoglycemia in detail with the patient. Literature supplied.   2) Diabetic complications:  Eye: Does not have known diabetic retinopathy.  Neuro/ Feet: Does  have known diabetic peripheral neuropathy. Renal: Patient does not have known baseline CKD. She is not on an ACEI/ARB at present.     Signed electronically by: Lyndle Herrlich, MD  [facility]  [facility] [address] [place] [place], [place] [address] Phone: [phone] FAX: [phone]   CC: Lianne Moris, Cordelia Poche [address] [place] [address] Phone: [phone]  Fax: [phone]    Return to Endocrinology clinic as below: Future Appointments  Date Time Provider Department Center  11/07/2023 10:15 AM Carlan,  Jeral Pinch, NP [facility] None

## 2022-12-06 DIAGNOSIS — Z1231 Encounter for screening mammogram for malignant neoplasm of breast: Secondary | ICD-10-CM | POA: Diagnosis not present

## 2022-12-10 ENCOUNTER — Other Ambulatory Visit: Payer: Self-pay | Admitting: *Deleted

## 2022-12-10 MED ORDER — DUPIXENT 300 MG/2ML ~~LOC~~ SOSY: 300.0000 mg | PREFILLED_SYRINGE | SUBCUTANEOUS | 11 refills | Status: DC

## 2022-12-12 DIAGNOSIS — M1711 Unilateral primary osteoarthritis, right knee: Secondary | ICD-10-CM | POA: Diagnosis not present

## 2022-12-26 ENCOUNTER — Ambulatory Visit: Payer: Medicare HMO | Admitting: Family Medicine

## 2022-12-26 NOTE — Progress Notes (Deleted)
   [address] [place] [address] Dept: [phone]  FOLLOW UP NOTE  Patient ID: Emily Humphrey, female    DOB: 14-Oct-1950  Age: 72 y.o. MRN: [id number] Date of Office Visit: 12/26/2022  Assessment  Chief Complaint: No chief complaint on file.  HPI Emily Humphrey is a 72 year old female who presents to the clinic for follow-up visit.  She was last seen in this clinic on 02/07/2022 by Nehemiah Settle, for evaluation of asthma/COPD overlap syndrome and chronic rhinitis.  Her current problem list includes diabetes with daily insulin use.  Her last environmental allergy skin testing was negative on 03/25/2021, however, the histamine was nonreactive.  She had lab testing for environmental allergies on 09/13/2021 which was positive to some molds.   Drug Allergies:  Allergies  Allergen Reactions   Atorvastatin     Other reaction(s): Muscle Pain   Empagliflozin     Other reaction(s): yeast   Penicillins Itching    hives   Sulfa Antibiotics Itching    rash    Physical Exam: There were no vitals taken for this visit.   Physical Exam  Diagnostics:    Assessment and Plan: No diagnosis found.  No orders of the defined types were placed in this encounter.   There are no Patient Instructions on file for this visit.  No follow-ups on file.    Thank you for the opportunity to care for this patient.  Please do not hesitate to contact me with questions.  Thermon Leyland, FNP [facility]

## 2022-12-27 ENCOUNTER — Telehealth: Payer: Self-pay | Admitting: Family Medicine

## 2022-12-27 NOTE — Telephone Encounter (Signed)
Patient called stating she ran out of Dupixent and was wondering if she could get Dupixent to hold her over until her appointment.

## 2022-12-27 NOTE — Telephone Encounter (Signed)
I checked patients medications and see where Tammy sent in a prescription last month with 11 refills to [facility], [place] - [address], [place] [address] Phone: [phone]  Fax: [phone]  I called the patient and provided with phone number and advised to call and set up delivery. I advised if any issues to please call me back. Patient verbalized understanding.

## 2022-12-28 NOTE — Telephone Encounter (Signed)
Patient called back and stated that [facility] informed her that they did not have a prescription on file and provided a phone number where I could call and give a verbal. I called and gave verbal prescription to dispense 1 box with 11 refills for the patient. Called patient and informed and advised to also keep upcoming appointment so that insurance will continue to cover her Dupixent. Patient verbalized understanding and will call back if she needs anything further.

## 2023-01-08 NOTE — Patient Instructions (Incomplete)
Asthma COPD overlap syndrome Continue Breztri 2 puffs twice a day with a spacer to prevent cough or wheeze Continue albuterol 2 puffs every 4 hours as needed for cough or wheeze OR Instead use albuterol 0.083% solution via nebulizer one unit vial every 4 hours as needed for cough or wheeze You may use albuterol 2 puffs 5 to 15 minutes before activity to decrease cough or wheeze Continue Dupixent injections 300 mg once every 14 days for asthma control Decrease daily prednisone from 10 mg to 7.5 mg daily  Chronic rhinitis Continue allergen avoidance measures directed toward mold as listed below Continue loratadine 10 mg once a day as needed for runny nose or itch Continue Atrovent 2 sprays in each nostril twice a day as needed for runny nose  Nasal polyposis Begin a nasal steroid spray daily- Flonase, Nasacort or Nasonex.  Reflux Continue dietary and lifestyle modifications as listed below  Call the clinic if this treatment plan is not working well for you.  Follow up in 6 weeks or sooner if needed.  Control of Mold Allergen Mold and fungi can grow on a variety of surfaces provided certain temperature and moisture conditions exist.  Outdoor molds grow on plants, decaying vegetation and soil.  The major outdoor mold, Alternaria and Cladosporium, are found in very high numbers during hot and dry conditions.  Generally, a late Summer - Fall peak is seen for common outdoor fungal spores.  Rain will temporarily lower outdoor mold spore count, but counts rise rapidly when the rainy period ends.  The most important indoor molds are Aspergillus and Penicillium.  Dark, humid and poorly ventilated basements are ideal sites for mold growth.  The next most common sites of mold growth are the bathroom and the kitchen.  Outdoor [employer] Use air conditioning and keep windows closed Avoid exposure to decaying vegetation. Avoid leaf raking. Avoid grain handling. Consider wearing a face mask if working  in moldy areas.  Indoor Mold Control Maintain humidity below 50%. Clean washable surfaces with 5% bleach solution. Remove sources e.g. Contaminated carpets.

## 2023-01-08 NOTE — Progress Notes (Signed)
[address] [place] [address] Dept: [phone]  FOLLOW UP NOTE  Patient ID: Emily Humphrey, female    DOB: 1950-12-26  Age: 72 y.o. MRN: [id number] Date of Office Visit: 01/09/2023  Assessment  Chief Complaint: Other (Dupixent renewal ) and Asthma (Some issues with shortness of breath without injections )  HPI Emily Humphrey is a 72 year old female who presents to the clinic for follow-up visit.  She was last seen in this clinic on 02/07/2022 by Nehemiah Settle, for evaluation of asthma/COPD overlap syndrome and chronic rhinitis.  Her current problem list includes diabetes with daily insulin use.    At today's visit, she reports her asthma has been well controlled with no shortness of breath, cough or wheeze with activity or rest. She continue s Breztri 2 puffs twice a day with a spacer and has not needed to use her albuterol since her last visit to this clinic. She continues Dupixent once every 2 weeks with no large or local reactions. She reports a significant decrease in her symptoms of asthma while continuing on Dupixent injections. She continues a daily prednisone dose of 10 mg once a day for asthma control. With her asthma well controlled, we will attempt to decrease her daily prednisone dose.   Allergic rhinitis is reported as moderately well-controlled with symptoms including occasional clear rhinorrhea and nasal congestion.  She continues Atrovent occasionally is is not using any steroid nasal sprays. Her last environmental allergy skin testing was negative on 03/25/2021, however, the histamine was nonreactive.  She had lab testing for environmental allergies on 09/13/2021 which was positive to some molds.  Nasal polyposis is reported as not well controlled with persistent symptoms including anosmia and ageusia. She is not using a nasal steroid spray at this time.   Reflux is reported as well controlled with daily pantoprazole.   Her current medications are listed in  the chart.  Drug Allergies:  Allergies  Allergen Reactions   Atorvastatin     Other reaction(s): Muscle Pain   Empagliflozin     Other reaction(s): yeast   Penicillins Itching    hives   Sulfa Antibiotics Itching    rash    Physical Exam: BP 132/84   Pulse (!) 113   Temp 97.8 F (36.6 C)   Ht 5\' 2"  (1.575 m)   Wt 161 lb 9.6 oz (73.3 kg)   SpO2 97%   BMI 29.56 kg/m    Physical Exam Vitals reviewed.  Constitutional:      Appearance: Normal appearance.  HENT:     Head: Normocephalic and atraumatic.     Right Ear: Tympanic membrane normal.     Left Ear: Tympanic membrane normal.     Nose:     Comments: Bilateral nares normal. Pharynx normal. Ears normal. Eyes normal.    Mouth/Throat:     Pharynx: Oropharynx is clear.  Eyes:     Conjunctiva/sclera: Conjunctivae normal.  Cardiovascular:     Rate and Rhythm: Normal rate and regular rhythm.     Heart sounds: Normal heart sounds. No murmur heard. Pulmonary:     Effort: Pulmonary effort is normal.     Breath sounds: Normal breath sounds.     Comments: Lungs clear to auscultation Musculoskeletal:        General: Normal range of motion.     Cervical back: Normal range of motion and neck supple.  Skin:    General: Skin is warm and dry.  Neurological:     Mental  Status: She is alert and oriented to person, place, and time.  Psychiatric:        Mood and Affect: Mood normal.        Behavior: Behavior normal.        Thought Content: Thought content normal.        Judgment: Judgment normal.     Diagnostics: FVC 2.35 which is 89% of predicted, FEV1 1.86 which is 90% of predicted value.  Spirometry indicates normal ventilatory function.  Assessment and Plan: 1. Asthma-COPD overlap syndrome   2. Nasal polyposis   3. Chronic rhinitis   4. Gastroesophageal reflux disease, unspecified whether esophagitis present     Patient Instructions  Asthma COPD overlap syndrome Continue Breztri 2 puffs twice a day with a spacer  to prevent cough or wheeze Continue albuterol 2 puffs every 4 hours as needed for cough or wheeze OR Instead use albuterol 0.083% solution via nebulizer one unit vial every 4 hours as needed for cough or wheeze You may use albuterol 2 puffs 5 to 15 minutes before activity to decrease cough or wheeze Continue Dupixent injections 300 mg once every 14 days for asthma control Decrease daily prednisone from 10 mg to 7.5 mg daily  Chronic rhinitis Continue allergen avoidance measures directed toward mold as listed below Continue loratadine 10 mg once a day as needed for runny nose or itch Continue Atrovent 2 sprays in each nostril twice a day as needed for runny nose  Nasal polyposis Begin a nasal steroid spray daily- Flonase, Nasacort or Nasonex.  Reflux Continue dietary and lifestyle modifications as listed below  Call the clinic if this treatment plan is not working well for you.  Follow up in 6 weeks or sooner if needed.  Return in about 6 weeks (around 02/20/2023), or if symptoms worsen or fail to improve.    Thank you for the opportunity to care for this patient.  Please do not hesitate to contact me with questions.  Thermon Leyland, FNP [facility]

## 2023-01-09 ENCOUNTER — Encounter: Payer: Self-pay | Admitting: Family Medicine

## 2023-01-09 ENCOUNTER — Other Ambulatory Visit: Payer: Self-pay

## 2023-01-09 ENCOUNTER — Ambulatory Visit: Payer: Medicare HMO | Admitting: Family Medicine

## 2023-01-09 VITALS — BP 132/84 | HR 113 | Temp 97.8°F | Ht 62.0 in | Wt 161.6 lb

## 2023-01-09 DIAGNOSIS — J339 Nasal polyp, unspecified: Secondary | ICD-10-CM | POA: Diagnosis not present

## 2023-01-09 DIAGNOSIS — J4489 Other specified chronic obstructive pulmonary disease: Secondary | ICD-10-CM | POA: Diagnosis not present

## 2023-01-09 DIAGNOSIS — J310 Chronic rhinitis: Secondary | ICD-10-CM | POA: Insufficient documentation

## 2023-01-09 DIAGNOSIS — K219 Gastro-esophageal reflux disease without esophagitis: Secondary | ICD-10-CM | POA: Diagnosis not present

## 2023-01-10 ENCOUNTER — Telehealth: Payer: Self-pay | Admitting: Family Medicine

## 2023-01-10 MED ORDER — BREZTRI AEROSPHERE 160-9-4.8 MCG/ACT IN AERO: 2.0000 | INHALATION_SPRAY | Freq: Two times a day (BID) | RESPIRATORY_TRACT | 11 refills | Status: AC

## 2023-01-10 MED ORDER — ALBUTEROL SULFATE HFA 108 (90 BASE) MCG/ACT IN AERS: 2.0000 | INHALATION_SPRAY | RESPIRATORY_TRACT | 6 refills | Status: DC | PRN

## 2023-01-10 MED ORDER — FAMOTIDINE 20 MG PO TABS: 20.0000 mg | ORAL_TABLET | Freq: Every day | ORAL | 5 refills | Status: DC

## 2023-01-10 MED ORDER — PREDNISONE 5 MG PO TABS: ORAL_TABLET | ORAL | 1 refills | Status: DC

## 2023-01-10 NOTE — Telephone Encounter (Signed)
Patient called stating she had an appointment with a NP yesterday and in that appointment she was told that her prednisone would be lowered to 5 milligrams and nothing has been sent to her pharmacy. The patients pharmacy is [facility] in [place] on [address]

## 2023-01-10 NOTE — Addendum Note (Signed)
Addended by: Orson Aloe on: 01/10/2023 04:40 PM   Modules accepted: Orders

## 2023-01-10 NOTE — Telephone Encounter (Signed)
Medication has been sent to requested pharmacy. 

## 2023-01-10 NOTE — Telephone Encounter (Signed)
Ca you please send in a prescription for 5 mg tablets. Take 1 1/2 tablets once a day (7.5 mg). #60 with one refill please. Thank you

## 2023-02-06 ENCOUNTER — Telehealth: Payer: Self-pay

## 2023-02-06 MED ORDER — SEMAGLUTIDE(0.25 OR 0.5MG/DOS) 2 MG/3ML ~~LOC~~ SOPN: PEN_INJECTOR | SUBCUTANEOUS | 1 refills | Status: DC

## 2023-02-06 NOTE — Telephone Encounter (Signed)
Patient states that she has been having elevated BS. Patient report is update on the [place].  Patient has been doing Novolin Mix 46 breakfast and 14 supper and Metformin 1 tab BID. Patient states that she would like to try Ozempic or Trulicity again.

## 2023-02-06 NOTE — Telephone Encounter (Signed)
Patient advised and prescription sent.  

## 2023-02-06 NOTE — Telephone Encounter (Signed)
Emily Humphrey, I See that Dr Kathie Rhodes wanted her to try Ozempic again. Let's send this for 0.5 mg weekly 3 pens with 1 refill, but ask her to start at 0.25 mg weekly for 2-4 doses to make sure she tolerates it before increasing the dose to 0.5 mg weekly. Ty! C

## 2023-02-19 DIAGNOSIS — E1165 Type 2 diabetes mellitus with hyperglycemia: Secondary | ICD-10-CM | POA: Diagnosis not present

## 2023-02-20 ENCOUNTER — Encounter: Payer: Self-pay | Admitting: Allergy & Immunology

## 2023-02-20 ENCOUNTER — Ambulatory Visit: Payer: Medicare HMO | Admitting: Allergy & Immunology

## 2023-02-20 VITALS — BP 128/78 | HR 97 | Temp 97.6°F | Resp 18 | Wt 163.2 lb

## 2023-02-20 DIAGNOSIS — K219 Gastro-esophageal reflux disease without esophagitis: Secondary | ICD-10-CM | POA: Diagnosis not present

## 2023-02-20 DIAGNOSIS — E1165 Type 2 diabetes mellitus with hyperglycemia: Secondary | ICD-10-CM | POA: Diagnosis not present

## 2023-02-20 DIAGNOSIS — I10 Essential (primary) hypertension: Secondary | ICD-10-CM | POA: Diagnosis not present

## 2023-02-20 DIAGNOSIS — Z7952 Long term (current) use of systemic steroids: Secondary | ICD-10-CM | POA: Diagnosis not present

## 2023-02-20 DIAGNOSIS — J310 Chronic rhinitis: Secondary | ICD-10-CM | POA: Diagnosis not present

## 2023-02-20 DIAGNOSIS — J4489 Other specified chronic obstructive pulmonary disease: Secondary | ICD-10-CM

## 2023-02-20 DIAGNOSIS — E7849 Other hyperlipidemia: Secondary | ICD-10-CM | POA: Diagnosis not present

## 2023-02-20 DIAGNOSIS — R911 Solitary pulmonary nodule: Secondary | ICD-10-CM | POA: Diagnosis not present

## 2023-02-20 DIAGNOSIS — Z1329 Encounter for screening for other suspected endocrine disorder: Secondary | ICD-10-CM | POA: Diagnosis not present

## 2023-02-20 DIAGNOSIS — J339 Nasal polyp, unspecified: Secondary | ICD-10-CM

## 2023-02-20 DIAGNOSIS — Z794 Long term (current) use of insulin: Secondary | ICD-10-CM | POA: Diagnosis not present

## 2023-02-20 MED ORDER — PREDNISONE 5 MG PO TABS: 5.0000 mg | ORAL_TABLET | Freq: Every day | ORAL | 1 refills | Status: AC

## 2023-02-20 NOTE — Patient Instructions (Addendum)
1. Asthma-COPD overlap syndrome (HCC) - Lung testing looked excellent. - We are going to decrease the prednisone from 7.5 mg to 5 mg daily. - I will let Dr. Lonzo Cloud know what we are doing. - Spacer use reviewed. - Daily controller medication(s): Breztri two puffs twice daily + Dupixent 300 mg every two weeks + prednisone 5 mg  - Prior to physical activity: albuterol 2 puffs 10-15 minutes before physical activity. - Rescue medications: albuterol 4 puffs every 4-6 hours as needed - Asthma control goals:  * Full participation in all desired activities (may need albuterol before activity) * Albuterol use two time or less a week on average (not counting use with activity) * Cough interfering with sleep two time or less a month * Oral steroids no more than once a year * No hospitalizations  2. Chronic non-allergic rhinitis - Continue with the loratadine 10mg  daily. - Continue with a nasal steroid  on Atrovent (ipratropium) one spray per nostril daily (can use up to 3 times daily if needed, but this can be overdrying).  3. Return in about 6 months (around 08/20/2023). You can have the follow up appointment with Dr. Dellis Anes or a Nurse Practicioner (our Nurse Practitioners are excellent and always have Physician oversight!).    Please inform us of any Emergency Department visits, hospitalizations, or changes in symptoms. Call us before going to the ED for breathing or allergy symptoms since we might be able to fit you in for a sick visit. Feel free to contact us anytime with any questions, problems, or concerns.  It was a pleasure to see you again today!  Websites that have reliable patient information: 1. American Academy of Asthma, Allergy, and Immunology: www.aaaai.org 2. Food Allergy Research and Education (FARE): foodallergy.org 3. Mothers of Asthmatics: http://www.asthmacommunitynetwork.org 4. American College of Allergy, Asthma, and Immunology: www.acaai.org   COVID-19 Vaccine  Information can be found at: [email or web address] For questions related to vaccine distribution or appointments, please email [email or web address] or call [phone].   We realize that you might be concerned about having an allergic reaction to the COVID19 vaccines. To help with that concern, WE ARE OFFERING THE COVID19 VACCINES IN OUR OFFICE! Ask the front desk for dates!     "Like" [place] on Facebook and Instagram for our latest updates!      A healthy democracy works best when [employer] participate! Make sure you are registered to vote! If you have moved or changed any of your contact information, you will need to get this updated before voting! Scan the QR codes below to learn more!

## 2023-02-20 NOTE — Progress Notes (Signed)
FOLLOW UP  Date of Service/Encounter:  02/20/23   Assessment:   Asthma-COPD overlap syndrome - with eosinophilic phenotype     Chronic non-allergic rhinitis   Nasal polyposis - stable   Chronic prednisone use - decreasing to 5 mg daily today  Complicated past medical history including type 2 diabetes as well as GERD  Plan/Recommendations:   1. Asthma-COPD overlap syndrome - Lung testing looked excellent. - We are going to decrease the prednisone from 7.5 mg to 5 mg daily. - I will let Dr. Lonzo Cloud know what we are doing. - Spacer use reviewed. - Daily controller medication(s): Breztri two puffs twice daily + Dupixent 300 mg every two weeks + prednisone 5 mg  - Prior to physical activity: albuterol 2 puffs 10-15 minutes before physical activity. - Rescue medications: albuterol 4 puffs every 4-6 hours as needed - Asthma control goals:  * Full participation in all desired activities (may need albuterol before activity) * Albuterol use two time or less a week on average (not counting use with activity) * Cough interfering with sleep two time or less a month * Oral steroids no more than once a year * No hospitalizations  2. Chronic non-allergic rhinitis - Continue with the loratadine 10mg  daily. - Continue with a nasal steroid  on Atrovent (ipratropium) one spray per nostril daily (can use up to 3 times daily if needed, but this can be overdrying).  3. Return in about 6 months (around 08/20/2023). You can have the follow up appointment with Dr. Dellis Anes or a Nurse Practicioner (our Nurse Practitioners are excellent and always have Physician oversight!).   Subjective:   Emily Humphrey is a 72 y.o. female presenting today for follow up of  Chief Complaint  Patient presents with   Follow-up    Emily Humphrey has a history of the following: Patient Active Problem List   Diagnosis Date Noted   Nasal polyposis 01/09/2023   Chronic rhinitis 01/09/2023   Irritable bowel  syndrome with both constipation and diarrhea 07/31/2022   Type 2 diabetes mellitus with diabetic polyneuropathy, with long-term current use of insulin (HCC) 07/16/2022   Type 2 diabetes mellitus with hyperglycemia, with long-term current use of insulin (HCC) 07/16/2022   DOE (dyspnea on exertion) 02/17/2021   Cerebral vascular disease 09/06/2020   Mild cognitive impairment 09/06/2020   Abnormal finding on MRI of brain 05/10/2020   Gait abnormality 05/10/2020   Essential hypertension, benign 08/01/2015   Type 2 diabetes mellitus, uncontrolled (HCC)    Hyperlipidemia    Asthma-COPD overlap syndrome    History of tobacco abuse    Asthma    GERD (gastroesophageal reflux disease)    Morbid obesity (HCC)    Chest pain    Pericardial effusion    Ejection fraction    Pericarditis     History obtained from: chart review and patient.  Emily Humphrey is a 72 y.o. female presenting for a follow up visit.  She was last seen in July 2024.  At that time, she was continued on Breztri 2 puffs twice daily as well as albuterol as needed.  She also continued on Dupixent every 14 days.  We decreased her daily prednisone from 10 mg to 7.5 mg daily.  For her rhinitis, we continue with loratadine as well as epidurogram.  She was started on a nasal steroid spray.  Reflux was controlled with dietary modifications.  Since last visit, she has done well.  Asthma/Respiratory Symptom History: She is very happy with how she  is feeling with the Dupixent. She has only needed to use her rescue inhaler twice. She was using her nebulizer and living on it in the past. She has not used it in almost two years.  She can manage symptoms without it.   Her prednisone was decreased from 10 to 7.5mg . She has been doing fine with that. She does not feel any difference. In total, she has been on prednisone for years. She has been on prednisone for ages. She sees Dr. Lonzo Cloud for her type 2 diabetes mellitus. She does not have thyroid  issues at all. She has discussed decreasing her prednisone with Dr. Lonzo Cloud. She has been decreasing the insulin which is something that Emily Humphrey wanted. She is on Ozempic and doing well with that.  Is not really clear how long she has been on chronic prednisone, but she estimates that it has been over a decade.  She has been on 10 mg daily for at least 2 or 3 years of that, with intermittent steroid bursts during that time.  She did have that weird episode in May 2023 where there was a thought that this was related to Ozempic or Dupixent. However they never figured out a cause of this and she restarted both medication without a problem.   Allergic Rhinitis Symptom History: She does have the rhinorrhea occasionally. She had polyp surgery 20 years ago. This was done in [place]. She cannot smell and does not have much in the way of taste.   It never delivered what he had promised. She has not noticed that the Dupixent has done much to help with recovery of  of her sense of smells or taste. She has been using Nasacort but this has not helped.   GERD Symptom History: She remains on the Protonix daily. This seems to be doing the trick.  She has famotidine to take as needed for particularly bad times. This is managed by her PCP Lianne Moris.   Otherwise, there have been no changes to her past medical history, surgical history, family history, or social history.    Review of systems otherwise negative other than that mentioned in the HPI.    Objective:   Blood pressure 128/78, pulse 97, temperature 97.6 F (36.4 C), resp. rate 18, weight 163 lb 4 oz (74 kg), SpO2 95%. Body mass index is 29.86 kg/m.    Physical Exam Vitals reviewed.  Constitutional:      Appearance: She is well-developed.     Comments: Delightful. Talkative.   HENT:     Head: Normocephalic and atraumatic.     Right Ear: Tympanic membrane, ear canal and external ear normal. No drainage, swelling or tenderness. Tympanic  membrane is not injected, scarred, erythematous, retracted or bulging.     Left Ear: Tympanic membrane, ear canal and external ear normal. No drainage, swelling or tenderness. Tympanic membrane is not injected, scarred, erythematous, retracted or bulging.     Nose: No nasal deformity, septal deviation, mucosal edema or rhinorrhea.     Right Turbinates: Enlarged, swollen and pale.     Left Turbinates: Enlarged, swollen and pale.     Right Sinus: No maxillary sinus tenderness or frontal sinus tenderness.     Left Sinus: No maxillary sinus tenderness or frontal sinus tenderness.     Comments: Bilateral nasal polyposis with approximately 50% obstruction bilaterally.    Mouth/Throat:     Lips: Pink.     Mouth: Mucous membranes are moist. Mucous membranes are not pale and not  dry.     Pharynx: Uvula midline.     Comments: Cobblestoning in the posterior oropharynx. Eyes:     General: Lids are normal. Allergic shiner present.        Right eye: No discharge.        Left eye: No discharge.     Conjunctiva/sclera: Conjunctivae normal.     Right eye: Right conjunctiva is not injected. No chemosis.    Left eye: Left conjunctiva is not injected. No chemosis.    Pupils: Pupils are equal, round, and reactive to light.  Cardiovascular:     Rate and Rhythm: Normal rate and regular rhythm.     Heart sounds: Normal heart sounds.  Pulmonary:     Effort: Pulmonary effort is normal. No tachypnea, accessory muscle usage or respiratory distress.     Breath sounds: Normal breath sounds. No wheezing, rhonchi or rales.     Comments: Moving air well in all lung fields.  No increased work of breathing. Chest:     Chest wall: No tenderness.  Abdominal:     Tenderness: There is no abdominal tenderness. There is no guarding or rebound.  Lymphadenopathy:     Head:     Right side of head: No submandibular, tonsillar or occipital adenopathy.     Left side of head: No submandibular, tonsillar or occipital adenopathy.      Cervical: Cervical adenopathy present.  Skin:    General: Skin is warm.     Capillary Refill: Capillary refill takes less than 2 seconds.     Coloration: Skin is not pale.     Findings: No abrasion, erythema, petechiae or rash. Rash is not papular, urticarial or vesicular.     Comments: No eczematous or urticarial lesions noted.  Neurological:     Mental Status: She is alert.  Psychiatric:        Behavior: Behavior is cooperative.      Diagnostic studies:    Spirometry: results normal (FEV1: 1.47/72%, FVC: 2.23/85%, FEV1/FVC: 66%).    Spirometry consistent with normal pattern.    Allergy Studies: none       Malachi Bonds, MD  [facility]

## 2023-02-26 DIAGNOSIS — M25561 Pain in right knee: Secondary | ICD-10-CM | POA: Diagnosis not present

## 2023-02-26 DIAGNOSIS — M722 Plantar fascial fibromatosis: Secondary | ICD-10-CM | POA: Diagnosis not present

## 2023-02-26 DIAGNOSIS — I10 Essential (primary) hypertension: Secondary | ICD-10-CM | POA: Diagnosis not present

## 2023-02-26 DIAGNOSIS — E1165 Type 2 diabetes mellitus with hyperglycemia: Secondary | ICD-10-CM | POA: Diagnosis not present

## 2023-02-26 DIAGNOSIS — E1140 Type 2 diabetes mellitus with diabetic neuropathy, unspecified: Secondary | ICD-10-CM | POA: Diagnosis not present

## 2023-02-26 DIAGNOSIS — Z6828 Body mass index (BMI) 28.0-28.9, adult: Secondary | ICD-10-CM | POA: Diagnosis not present

## 2023-02-26 DIAGNOSIS — E7849 Other hyperlipidemia: Secondary | ICD-10-CM | POA: Diagnosis not present

## 2023-02-26 DIAGNOSIS — Z23 Encounter for immunization: Secondary | ICD-10-CM | POA: Diagnosis not present

## 2023-03-25 DIAGNOSIS — Z23 Encounter for immunization: Secondary | ICD-10-CM | POA: Diagnosis not present

## 2023-04-02 DIAGNOSIS — M25551 Pain in right hip: Secondary | ICD-10-CM | POA: Diagnosis not present

## 2023-04-02 DIAGNOSIS — M25552 Pain in left hip: Secondary | ICD-10-CM | POA: Diagnosis not present

## 2023-04-02 DIAGNOSIS — M722 Plantar fascial fibromatosis: Secondary | ICD-10-CM | POA: Diagnosis not present

## 2023-04-02 DIAGNOSIS — G8929 Other chronic pain: Secondary | ICD-10-CM | POA: Diagnosis not present

## 2023-04-02 DIAGNOSIS — M1711 Unilateral primary osteoarthritis, right knee: Secondary | ICD-10-CM | POA: Diagnosis not present

## 2023-04-12 DIAGNOSIS — K9289 Other specified diseases of the digestive system: Secondary | ICD-10-CM | POA: Diagnosis not present

## 2023-04-12 DIAGNOSIS — L299 Pruritus, unspecified: Secondary | ICD-10-CM | POA: Diagnosis not present

## 2023-04-12 DIAGNOSIS — K5900 Constipation, unspecified: Secondary | ICD-10-CM | POA: Diagnosis not present

## 2023-04-12 DIAGNOSIS — Z6827 Body mass index (BMI) 27.0-27.9, adult: Secondary | ICD-10-CM | POA: Diagnosis not present

## 2023-04-12 DIAGNOSIS — R030 Elevated blood-pressure reading, without diagnosis of hypertension: Secondary | ICD-10-CM | POA: Diagnosis not present

## 2023-04-23 DIAGNOSIS — Z23 Encounter for immunization: Secondary | ICD-10-CM | POA: Diagnosis not present

## 2023-04-30 ENCOUNTER — Encounter (INDEPENDENT_AMBULATORY_CARE_PROVIDER_SITE_OTHER): Payer: Self-pay | Admitting: Gastroenterology

## 2023-04-30 ENCOUNTER — Ambulatory Visit (HOSPITAL_COMMUNITY)
Admission: RE | Admit: 2023-04-30 | Discharge: 2023-04-30 | Disposition: A | Discharge status: Home / Self Care | Payer: Medicare HMO | Source: Ambulatory Visit | Attending: Gastroenterology | Admitting: Gastroenterology

## 2023-04-30 ENCOUNTER — Ambulatory Visit (INDEPENDENT_AMBULATORY_CARE_PROVIDER_SITE_OTHER): Payer: Medicare HMO | Admitting: Gastroenterology

## 2023-04-30 VITALS — BP 112/70 | HR 86 | Temp 98.6°F | Ht 62.0 in | Wt 157.8 lb

## 2023-04-30 DIAGNOSIS — K582 Mixed irritable bowel syndrome: Secondary | ICD-10-CM | POA: Diagnosis not present

## 2023-04-30 DIAGNOSIS — R197 Diarrhea, unspecified: Secondary | ICD-10-CM | POA: Diagnosis not present

## 2023-04-30 NOTE — Progress Notes (Signed)
Referring Provider: Lianne Moris, PA-C Primary Care Physician:  Lianne Moris, PA-C Primary GI Physician: Dr. Levon Hedger   Chief Complaint  Patient presents with   Irritable Bowel Syndrome    Follow up on IBS. Having some problems with constipation. Tried linzess 72 samples from pcp, stools, dulcolax, miralax and no relief. Has explosive diarrhea and wears depends due to not having any warning. Has abdominal pain and bloating.    HPI:   Emily Humphrey is a 72 y.o. female with past medical history of asthma, COPD, dyslipidemia, GERD, DM, OSA   Patient presenting today for follow up of IBS-M  Last seen may 2024, at that time she notes that metformin was decreased which seem to help her diarrhea.  Having a BM every other day usually just 1 stool.  Recently on hydrocodone for acute knee injury which has not caused her any constipation.  Notes some continued protrusion to the left lower quadrant when standing but no pain.  Patient recommended to continue daily probiotic, CRP, fecal calprotectin, celiac panel if diarrhea recurs, repeat colonoscopy in 2025.  Present: States she has been having more issues with constipation. Saw PCP who gave her linzess x12 days which did not seem to improve her symptoms. She has also tried stool softener, dulcolax , metamucil and miralax as welll without much improvement. She notes a lot of bloating and urge to defecate but having trouble going. Last BM was Saturday or Sunday but notes this was a small volume. She reports that she has had a couple of episodes of "explosive" diarrhea as she did not get to the restroom in time. She has some pain in her abdomen at times. Often feels that she needs to pass stool but cannot. Denies nausea or vomiting. Appetite is stable.   She notes that she had a night where she was passing a lot of gas and had a lot of sour belching. This was about 1 week ago. Has not had recurrence of this. She is trying to do gluten free diet as  much as possible and taking daily probiotic.   She had labs done about 4 months ago with her PCP.   CT A/P w/contrast in November Colonic diverticulosis, without radiographic evidence of  diverticulitis or other acute findings.  Last Colonoscopy:07/2020  - Diverticulosis in the sigmoid colon.  - Four 4 to 12 mm polyps in the cecum, at the ileocecal valve, at 55 cm proximal to the anus and at  60 cm proximal to the anus. (Tubular adenomas) - Non-bleeding internal hemorrhoids.  Last Endoscopy:   Recommendations:  Repeat TCS in feb 2025   Past Medical History:  Diagnosis Date   Abnormal gait    Asthma    Chest pain    Hospital, May, 2012,  Pericarditis   COPD (chronic obstructive pulmonary disease) (HCC)    Chronic steroid use   Dyslipidemia    Ejection fraction    Normal, echo, [facility],4132   GERD (gastroesophageal reflux disease)    History of tobacco abuse    IDDM (insulin dependent diabetes mellitus)    Morbid obesity (HCC)    OSA (obstructive sleep apnea)    mild/not using C-PAP    Pericardial effusion    Small, echo, circumferential, May, 2012   Pericarditis    Hospitalization, May, 2012   Pneumonia 2010   Tremor     Past Surgical History:  Procedure Laterality Date   ABDOMINAL HYSTERECTOMY     NASAL SINUS SURGERY  Removal of throat nodules     vocal cored nodules    Current Outpatient Medications  Medication Sig Dispense Refill   ACCU-CHEK AVIVA PLUS test strip      albuterol (PROVENTIL) (2.5 MG/3ML) 0.083% nebulizer solution Take 2.5 mg by nebulization every 4 (four) hours as needed for wheezing or shortness of breath.     albuterol (VENTOLIN HFA) 108 (90 Base) MCG/ACT inhaler Inhale 2 puffs into the lungs every 4 (four) hours as needed. 17 each 6   ALPRAZolam (XANAX) 0.25 MG tablet Take 0.25 mg by mouth daily as needed.     Budeson-Glycopyrrol-Formoterol (BREZTRI AEROSPHERE) 160-9-4.8 MCG/ACT AERO Inhale 2 puffs into the lungs in the morning and at bedtime.  10.7 g 11   CALCIUM PO Take 1 tablet by mouth daily.     Continuous Blood Gluc Sensor (FREESTYLE LIBRE 3 SENSOR) MISC 1 Device by Does not apply route every 14 (fourteen) days. 6 each 3   donepezil (ARICEPT) 10 MG tablet Take 1 tablet (10 mg total) by mouth at bedtime. 30 tablet 11   dupilumab (DUPIXENT) 300 MG/2ML prefilled syringe Inject 300 mg into the skin every 14 (fourteen) days. 4 mL 11   famotidine (PEPCID) 20 MG tablet Take 1 tablet (20 mg total) by mouth daily. 30 tablet 5   gabapentin (NEURONTIN) 600 MG tablet Take 600 mg by mouth 3 (three) times daily.     HYDROcodone-acetaminophen (NORCO/VICODIN) 5-325 MG tablet Take 1 tablet by mouth every 6 (six) hours as needed.     insulin isophane & regular human KwikPen (NOVOLIN 70/30 KWIKPEN) (70-30) 100 UNIT/ML KwikPen Inject 46 Units into the skin daily before breakfast AND 18 Units daily before supper. (Patient taking differently: Inject 46 Units into the skin daily before breakfast AND 16 Units daily before supper.) 60 mL 2   Insulin Pen Needle 32G X 4 MM MISC 1 Device by Does not apply route in the morning and at bedtime. 200 each 2   meloxicam (MOBIC) 7.5 MG tablet Take 7.5 mg by mouth 2 (two) times daily.     metFORMIN (GLUCOPHAGE-XR) 500 MG 24 hr tablet Take 1 tablet (500 mg total) by mouth 2 (two) times daily. 180 tablet 2   pantoprazole (PROTONIX) 40 MG tablet Take 40 mg by mouth daily.     Potassium 99 MG TABS Take 1 tablet by mouth as needed.     predniSONE (DELTASONE) 5 MG tablet Take 1 tablet (5 mg total) by mouth daily with breakfast. 90 tablet 1   Probiotic Product (PROBIOTIC DAILY PO) Take by mouth. One daily     Semaglutide,0.25 or 0.5MG /DOS, 2 MG/3ML SOPN 0.25mg  for 4 doses and then increase to the 0.5mg  weekly. 3 mL 1   simvastatin (ZOCOR) 20 MG tablet Take 20 mg by mouth daily.     traZODone (DESYREL) 50 MG tablet Take 50 mg by mouth at bedtime.     venlafaxine XR (EFFEXOR-XR) 75 MG 24 hr capsule Take 75 mg by mouth daily.      VITAMIN D, CHOLECALCIFEROL, PO Take by mouth.     Current Facility-Administered Medications  Medication Dose Route Frequency Provider Last Rate Last Admin   dupilumab (DUPIXENT) prefilled syringe 300 mg  300 mg Subcutaneous Q14 Days Emily Blend, FNP   300 mg at 11/06/21 1717    Allergies as of 04/30/2023 - Review Complete 04/30/2023  Allergen Reaction Noted   Atorvastatin  01/22/2017   Empagliflozin  07/06/2021   Penicillins Itching 12/13/2010  Sulfa antibiotics Itching 12/26/2010    Family History  Problem Relation Age of Onset   Heart attack Mother        deceased at age 16   Asthma Mother    Other Father        deceased at age 62   Diabetes Sister     Social History   Socioeconomic History   Marital status: Divorced    Spouse name: Not on file   Number of children: 2   Years of education: 12   Highest education level: Not on file  Occupational History   Occupation: RETIRED    Comment: 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   Caffeine 1 c daily   Social Determinants of Health   Financial Resource Strain: Not on file  Food Insecurity: Not on file  Transportation Needs: Not on file  Physical Activity: Not on file  Stress: Not on file  Social Connections: Not on file    Review of systems General: negative for malaise, night sweats, fever, chills, weight loss Neck: Negative  for lumps, goiter, pain and significant neck swelling Resp: Negative for cough, wheezing, dyspnea at rest CV: Negative for chest pain, leg swelling, palpitations, orthopnea GI: denies melena, hematochezia, nausea, vomiting, dysphagia, odyonophagia, early satiety or unintentional weight loss. +constipation +diarrhea The remainder of the review of systems is noncontributory.  Physical Exam: There were no vitals taken for this visit. General:   Alert and oriented. No distress noted. Pleasant and cooperative.  Head:  Normocephalic and atraumatic. Eyes:  Conjuctiva clear without scleral icterus. Mouth:  Oral mucosa pink and moist. Good dentition. No lesions. Heart: Normal rate and rhythm, s1 and s2 heart sounds present.  Lungs: Clear lung sounds in all lobes. Respirations equal and unlabored. Abdomen:  +BS, soft, non-tender and non-distended. No rebound or guarding. No HSM or masses noted. Neurologic:  Alert and  oriented x4 Psych:  Alert and cooperative. Normal mood and affect.  Invalid input(s): "6 MONTHS"   ASSESSMENT: Emily Humphrey is a 72 y.o. female presenting today for IBS-M  Patient reports feeling more constipated recently, has not had a BM since over the weekend which was quite small. She has had a few episodes of "explosive diarrhea," query overflow diarrhea secondary to constipation. Tried linzess without results. Will obtain abdominal xray today to evaluate for colonic stool burden, if presence of large amount of stool, will send bowel prep and start Ibsrela thereafter.    PLAN:  Abd xray 2 view 2. Bowel prep if large stool burden present on abd xray  3. Start ibsrela after bowel prep  All questions were answered, patient verbalized understanding and is in agreement with plan as outlined above.  Follow Up: 6-8 weeks   Emily Hineman L.  Jeanmarie Hubert, MSN, APRN, AGNP-C Adult-Gerontology Nurse Practitioner [facility]  I have reviewed the note and agree with  the APP's assessment as described in this progress note  Is presenting persistent constipation despite trying different prescription laxatives, will need to consider performing an anorectal manometry.  Katrinka Blazing, MD Gastroenterology and Hepatology [facility]

## 2023-04-30 NOTE — Patient Instructions (Signed)
Please go over to [facility] for xray of your abdomen If xray shows a lot of stool, I will send a bowel prep to get you cleaned out and start you on something for constipation thereafter  Follow up 6-8 weeks  It was a pleasure to see you today. I want to create trusting relationships with patients and provide genuine, compassionate, and quality care. I truly value your feedback! please be on the lookout for a survey regarding your visit with me today. I appreciate your input about our visit and your time in completing this!    Laketta Soderberg L. Jeanmarie Hubert, MSN, APRN, AGNP-C Adult-Gerontology Nurse Practitioner [facility] at [facility]

## 2023-05-02 ENCOUNTER — Other Ambulatory Visit: Payer: Self-pay

## 2023-05-02 MED ORDER — SEMAGLUTIDE(0.25 OR 0.5MG/DOS) 2 MG/3ML ~~LOC~~ SOPN: 0.5000 mg | PEN_INJECTOR | SUBCUTANEOUS | 3 refills | Status: DC

## 2023-05-03 ENCOUNTER — Telehealth (INDEPENDENT_AMBULATORY_CARE_PROVIDER_SITE_OTHER): Payer: Self-pay | Admitting: *Deleted

## 2023-05-03 ENCOUNTER — Other Ambulatory Visit: Payer: Self-pay

## 2023-05-03 MED ORDER — SEMAGLUTIDE(0.25 OR 0.5MG/DOS) 2 MG/3ML ~~LOC~~ SOPN: 0.5000 mg | PEN_INJECTOR | SUBCUTANEOUS | 3 refills | Status: DC

## 2023-05-03 NOTE — Telephone Encounter (Signed)
Patient called to get results of xray. I called reading room and asked for it to be read. I let pt know it has not been yet but I called to get it read today. I let pt know we close at 11 on Friday and probably wont be read before we leave office. I asked her if she used mychart since I see she logged in on 11/13 and she said she was unable to use it and asked for a call when report came in on phone number [phone]

## 2023-05-06 ENCOUNTER — Other Ambulatory Visit (INDEPENDENT_AMBULATORY_CARE_PROVIDER_SITE_OTHER): Payer: Self-pay | Admitting: Gastroenterology

## 2023-05-06 MED ORDER — IBSRELA 50 MG PO TABS: 50.0000 mg | ORAL_TABLET | Freq: Two times a day (BID) | ORAL | 3 refills | Status: DC

## 2023-05-06 NOTE — Telephone Encounter (Signed)
 Crystal spoke with patient

## 2023-05-06 NOTE — Telephone Encounter (Signed)
Pt wanted xray results

## 2023-05-13 ENCOUNTER — Telehealth (INDEPENDENT_AMBULATORY_CARE_PROVIDER_SITE_OTHER): Payer: Self-pay

## 2023-05-13 NOTE — Telephone Encounter (Signed)
Does patient have a diagnosis of IBS C? I see patient has had a combination of IBS mix. The IBSreala is only covered under IBS C. Please advise.

## 2023-05-14 NOTE — Telephone Encounter (Signed)
I have submitted the Prior Authorization to the payor, awaiting determination.

## 2023-05-14 NOTE — Telephone Encounter (Signed)
(  IBSRELA Denied) Per [employer], Patient has to try the preferred Lubiprostone capsules. Patient has not tried this in the past. Please advise.

## 2023-05-15 ENCOUNTER — Other Ambulatory Visit (INDEPENDENT_AMBULATORY_CARE_PROVIDER_SITE_OTHER): Payer: Self-pay

## 2023-05-15 DIAGNOSIS — K581 Irritable bowel syndrome with constipation: Secondary | ICD-10-CM

## 2023-05-15 MED ORDER — LUBIPROSTONE 8 MCG PO CAPS: 8.0000 ug | ORAL_CAPSULE | Freq: Two times a day (BID) | ORAL | 3 refills | Status: DC

## 2023-05-15 NOTE — Telephone Encounter (Signed)
I called and left a message asked that the patient please return call to the office.

## 2023-05-15 NOTE — Telephone Encounter (Signed)
Sent to [facility]

## 2023-05-20 ENCOUNTER — Encounter (INDEPENDENT_AMBULATORY_CARE_PROVIDER_SITE_OTHER): Payer: Self-pay

## 2023-05-20 ENCOUNTER — Telehealth: Payer: Self-pay | Admitting: Allergy & Immunology

## 2023-05-20 DIAGNOSIS — E1165 Type 2 diabetes mellitus with hyperglycemia: Secondary | ICD-10-CM | POA: Diagnosis not present

## 2023-05-20 NOTE — Telephone Encounter (Signed)
  Patient read the My Chart message see below. We tried calling you to let you know that the previous medication (IBSRELA) that [facility] sent into you was denied by your insurance. We have sent in a different Medication (Amitizia) that your insurance will cover into [facility]. Please let us know if any issues. Thanks, Davis Vannatter I called and left a message asked that the patient please return call to the office.        Note    You5 days ago      Sent to [facility].       Note    Carlan, Chelsea L, NP  You6 days ago      We can try amitiza BID #60, 1 refill    You routed conversation to [place], [employer], NP6 days ago    You6 days ago      [facility] Denied) Per [employer], Patient has to try the preferred Lubiprostone capsules. Patient has not tried this in the past. Please advise.     Last read by Marden Noble "pam" at  1:15 PM on 05/20/2023.

## 2023-05-20 NOTE — Telephone Encounter (Signed)
Patient called stating she recieved a paper regarding Dupixent. Patient states she has some questions about the paper. Patient states the paper is asking about insurance for 2025 and the patient states she is switching insurances and the new insurance does not go into effect until January. Patient is needing help with that letter.

## 2023-05-24 NOTE — Telephone Encounter (Signed)
Spoke to patient and discussed 2025 changes and next steps for Dupixent My [facility]

## 2023-06-03 ENCOUNTER — Ambulatory Visit: Payer: Medicare HMO | Admitting: Internal Medicine

## 2023-06-03 ENCOUNTER — Encounter: Payer: Self-pay | Admitting: Internal Medicine

## 2023-06-03 VITALS — BP 126/78 | HR 87 | Ht 62.0 in | Wt 158.0 lb

## 2023-06-03 DIAGNOSIS — Z794 Long term (current) use of insulin: Secondary | ICD-10-CM | POA: Diagnosis not present

## 2023-06-03 DIAGNOSIS — E2749 Other adrenocortical insufficiency: Secondary | ICD-10-CM | POA: Diagnosis not present

## 2023-06-03 DIAGNOSIS — E1165 Type 2 diabetes mellitus with hyperglycemia: Secondary | ICD-10-CM | POA: Diagnosis not present

## 2023-06-03 LAB — POCT GLYCOSYLATED HEMOGLOBIN (HGB A1C): Hemoglobin A1C: 6.2 % — AB (ref 4.0–5.6)

## 2023-06-03 MED ORDER — HYDROCORTISONE 10 MG PO TABS: 10.0000 mg | ORAL_TABLET | ORAL | 3 refills | Status: AC

## 2023-06-03 MED ORDER — NOVOLIN 70/30 FLEXPEN (70-30) 100 UNIT/ML ~~LOC~~ SUPN: PEN_INJECTOR | SUBCUTANEOUS | 4 refills | Status: DC

## 2023-06-03 MED ORDER — INSULIN PEN NEEDLE 32G X 4 MM MISC: 1.0000 | Freq: Two times a day (BID) | 3 refills | Status: DC

## 2023-06-03 NOTE — Progress Notes (Signed)
Name: Emily Humphrey  MRN/ DOB: [id number], [birth year]   Age/ Sex: 72 y.o., female    PCP: Lianne Moris, PA-C   Reason for Endocrinology Evaluation: Type 2 Diabetes Mellitus     Date of Initial Endocrinology Visit: 07/16/2022    PATIENT IDENTIFIER: [name] is a 72 y.o. female with a past medical history of DM, dyslipidemia, COPD, . The patient presented for initial endocrinology clinic visit on 07/16/2022  for consultative assistance with her diabetes management.    HPI:   Diagnosed with DM > 20 yrs   Prior Medications tried/Intolerance: Jardiance- yeast infection Hemoglobin A1c has ranged from 7.5% in 2023, peaking at 11.0% in the past .   Saw  Dr. Talmage Nap in the past     She is on chronic prednisone for Asthma   Took one dose of Ozempic  but PCP advised to stop it as she had low BP and not feeling well at the time  On her initial visit to our clinic she had an A1c of 8.5%, she was on metformin and Novolin mix, I decreased her metformin due to reported diarrhea and adjusted her insulin  SECONDARY ADRENAL INSUFFICIENCY: The patient was on chronic glucocorticoid therapy for asthma-COPD overlap syndrome..   SUBJECTIVE:   During the last visit (11/30/2022): A1c 7.4%  Today (06/03/23): [name] is here for follow-up on diabetes management.  She  checks her blood sugars multiple  times daily. The patient has  had hypoglycemic episodes since the last clinic visit. The patient is  symptomatic with these episodes.   Patient follows with GI for IBS- C  Denies nausea or vomiting She has constipation  , the medications has been causing diarrhea  Patient follows with [facility] for asthma-COPD overlap syndrome Per Dr. Ellouise Newer recommendations we will work on weaning the patient off Glucotrol therapy.  She has been on prednisone 5 mg since 02/20/2023  Denies SOB , on dupixent  Denies cough to replace prednisone  HOME DIABETES REGIMEN: Metformin 500 mg XR 1 tabs   BID  Ozempic 0.5 mg weekly Novolin Mix 46 units with breakfast and 14 units before Supper  Prednisone 5 mg daily    Statin: yes ACE-I/ARB: no    CONTINUOUS GLUCOSE MONITORING RECORD INTERPRETATION    Dates of Recording: 12/3-12/16/2024  Sensor description: [employer]  Results statistics:   CGM use % of time 97  Average and SD 85/34.8  Time in range  85 %  % Time Above 180 8  % Time above 250 0  % Time Below target 0   Glycemic patterns summary: BGs are optimal throughout the day and night  Hyperglycemic episodes post prandial  Hypoglycemic episodes occurred during the day and night  Overnight periods: Trends down   DIABETIC COMPLICATIONS: Microvascular complications:  Neuropathy  Denies: CKD  Last eye exam: Completed 06/2021  Macrovascular complications:   Denies: CAD, PVD, CVA   PAST HISTORY: Past Medical History:  Past Medical History:  Diagnosis Date   Abnormal gait    Asthma    Chest pain    Hospital, May, 2012,  Pericarditis   COPD (chronic obstructive pulmonary disease) (HCC)    Chronic steroid use   Dyslipidemia    Ejection fraction    Normal, echo, XBM,8413   GERD (gastroesophageal reflux disease)    History of tobacco abuse    IDDM (insulin dependent diabetes mellitus)    Morbid obesity (HCC)    OSA (obstructive sleep apnea)    mild/not using  C-PAP    Pericardial effusion    Small, echo, circumferential, May, 2012   Pericarditis    Hospitalization, May, 2012   Pneumonia 2010   Tremor    Past Surgical History:  Past Surgical History:  Procedure Laterality Date   ABDOMINAL HYSTERECTOMY     NASAL SINUS SURGERY     Removal of throat nodules     vocal cored nodules    Social History:  reports that she quit smoking about 28 years ago. Her smoking use included cigarettes. She started smoking about 48 years ago. She has a 20 pack-year smoking history. She has been exposed to tobacco smoke. She has never used smokeless tobacco. She  reports that she does not currently use alcohol. She reports that she does not use drugs. Family History:  Family History  Problem Relation Age of Onset   Heart attack Mother        deceased at age 56   Asthma Mother    Other Father        deceased at age 29   Diabetes Sister      HOME MEDICATIONS: Allergies as of 06/03/2023       Reactions   Atorvastatin    Other reaction(s): Muscle Pain   Empagliflozin    Other reaction(s): yeast   Penicillins Itching   hives   Sulfa Antibiotics Itching   rash        Medication List        Accurate as of June 03, 2023 10:48 AM. If you have any questions, ask your nurse or doctor.          Accu-Chek Aviva Plus test strip Generic drug: glucose blood   albuterol 108 (90 Base) MCG/ACT inhaler Commonly known as: VENTOLIN HFA Inhale 2 puffs into the lungs every 4 (four) hours as needed. What changed: Another medication with the same name was removed. Continue taking this medication, and follow the directions you see here.   ALPRAZolam 0.25 MG tablet Commonly known as: XANAX Take 0.25 mg by mouth daily as needed.   Breztri Aerosphere 160-9-4.8 MCG/ACT Aero Generic drug: Budeson-Glycopyrrol-Formoterol Inhale 2 puffs into the lungs in the morning and at bedtime.   CALCIUM PO Take 1 tablet by mouth daily.   donepezil 10 MG tablet Commonly known as: ARICEPT Take 1 tablet (10 mg total) by mouth at bedtime.   Dupixent 300 MG/2ML prefilled syringe Generic drug: dupilumab Inject 300 mg into the skin every 14 (fourteen) days.   famotidine 20 MG tablet Commonly known as: PEPCID Take 1 tablet (20 mg total) by mouth daily.   FreeStyle Libre 3 Sensor Misc 1 Device by Does not apply route every 14 (fourteen) days.   gabapentin 600 MG tablet Commonly known as: NEURONTIN Take 600 mg by mouth 3 (three) times daily.   HYDROcodone-acetaminophen 5-325 MG tablet Commonly known as: NORCO/VICODIN Take 1 tablet by mouth every 6  (six) hours as needed.   Ibsrela 50 MG Tabs Generic drug: Tenapanor HCl Take 50 mg by mouth 2 (two) times daily.   Insulin Pen Needle 32G X 4 MM Misc 1 Device by Does not apply route in the morning and at bedtime.   lubiprostone 8 MCG capsule Commonly known as: Amitiza Take 1 capsule (8 mcg total) by mouth 2 (two) times daily with a meal.   meloxicam 7.5 MG tablet Commonly known as: MOBIC Take 7.5 mg by mouth 2 (two) times daily.   metFORMIN 500 MG 24 hr tablet Commonly known  as: GLUCOPHAGE-XR Take 1 tablet (500 mg total) by mouth 2 (two) times daily.   NovoLIN 70/30 Kwikpen (70-30) 100 UNIT/ML KwikPen Generic drug: insulin isophane & regular human KwikPen Inject 46 Units into the skin daily before breakfast AND 18 Units daily before supper. What changed: See the new instructions.   pantoprazole 40 MG tablet Commonly known as: PROTONIX Take 40 mg by mouth daily.   Potassium 99 MG Tabs Take 1 tablet by mouth as needed.   predniSONE 5 MG tablet Commonly known as: DELTASONE Take 5 mg by mouth every morning.   PROBIOTIC DAILY PO Take by mouth. One daily   Semaglutide(0.25 or 0.5MG /DOS) 2 MG/3ML Sopn Inject 0.5 mg into the skin once a week.   simvastatin 20 MG tablet Commonly known as: ZOCOR Take 20 mg by mouth daily.   traZODone 50 MG tablet Commonly known as: DESYREL Take 50 mg by mouth at bedtime.   triamcinolone cream 0.1 % Commonly known as: KENALOG SMARTSIG:sparingly Topical Twice Daily   venlafaxine XR 75 MG 24 hr capsule Commonly known as: EFFEXOR-XR Take 75 mg by mouth daily.   VITAMIN D (CHOLECALCIFEROL) PO Take by mouth.         ALLERGIES: Allergies  Allergen Reactions   Atorvastatin     Other reaction(s): Muscle Pain   Empagliflozin     Other reaction(s): yeast   Penicillins Itching    hives   Sulfa Antibiotics Itching    rash     REVIEW OF SYSTEMS: A comprehensive ROS was conducted with the patient and is negative except as per  HPI     OBJECTIVE:   VITAL SIGNS: BP 126/78 (BP Location: Left Arm, Patient Position: Sitting, Cuff Size: Large)   Pulse 87   Ht 5\' 2"  (1.575 m)   Wt 158 lb (71.7 kg)   SpO2 98%   BMI 28.90 kg/m    Filed Weights   06/03/23 1044  Weight: 158 lb (71.7 kg)    PHYSICAL EXAM:  General: Pt appears well and is in NAD  Neck: General: Supple without adenopathy or carotid bruits. Thyroid: Thyroid size normal.  No goiter or nodules appreciated.   Lungs: Clear with good BS bilat with no rales, rhonchi, or wheezes  Heart: RRR   Extremities:  Lower extremities - No pretibial edema.   Neuro: MS is good with appropriate affect, pt is alert and Ox3    DM foot exam: 07/16/2022  The skin of the feet is intact without sores or ulcerations. The pedal pulses are 2+ on right and 2+ on left. The sensation is intact to a screening 5.07, 10 gram monofilament bilaterally   DATA REVIEWED:  Lab Results  Component Value Date   HGBA1C 7.4 10/24/2022   HGBA1C 9.2 (H) 09/28/2015   HGBA1C 9.8 06/23/2015     10/24/2022 BUN 11 GFR 65 TG 154 LDL 61 A1c 7.4%     ASSESSMENT / PLAN / RECOMMENDATIONS:   1) Type 2 Diabetes Mellitus,  Optimally controlled, With neuropathic  complications - Most recent A1c of 6.2 %. Goal A1c < 7.0 %.    -Historically she has endorsed chronic diarrhea that she attributed to metformin, after reducing metformin by 50%, she has noted dramatic improvement to the diarrhea - Has intolerance to Jardiance due to recurrent yeast infections -She is tolerating Ozempic, we have opted to remain on the current dose as she just recently increased it from 0.25 to 0.5 mg weekly -Patient has been noted with hypoglycemia on CGM  download, will adjust insulin as below  MEDICATIONS: Continue metformin 500 mg XR 1  tab BID  Continue Ozempic 0.5 mg weekly Change Novolin Mix to 36 units before breakfast and 10 units before supper   EDUCATION / INSTRUCTIONS: BG monitoring instructions:  Patient is instructed to check her blood sugars 2 times a day. Call  Endocrinology clinic if: BG persistently < 70  I reviewed the Rule of 15 for the treatment of hypoglycemia in detail with the patient. Literature supplied.   2) Diabetic complications:  Eye: Does not have known diabetic retinopathy.  Neuro/ Feet: Does  have known diabetic peripheral neuropathy. Renal: Patient does not have known baseline CKD. She is not on an ACEI/ARB at present.   3) Secondary Adrenal Insufficiency:   -She has been on chronic glucocorticoid therapy for asthma-COPD overlap -She is ready to wean off glucocorticoids through her allergist -She has been on prednisone 5 mg since September 2024 -We will switch to hydrocortisone as below -ACTH pending today -Sick day will discuss today  Medication Start prednisone 5 mg Start hydrocortisone 10 mg, 1.5 tablets with breakfast and half a tablet in the afternoon between 2-4 PM  Signed electronically by: Lyndle Herrlich, MD  [facility] Endocrinology  [facility] [address] [place] [place], [place] [address] Phone: [phone] FAX: [phone]   CC: Dell Ponto [address] [place] [address] Phone: [phone]  Fax: [phone]    Return to Endocrinology clinic as below: Future Appointments  Date Time Provider Department Center  06/03/2023 10:50 AM Cem Kosman, Konrad Dolores, MD [facility] None  06/25/2023  2:15 PM Raquel James, NP [facility] None  08/23/2023  3:00 PM Alfonse Spruce, MD [facility] None  11/07/2023 10:15 AM Carlan, Jeral Pinch, NP [facility] None

## 2023-06-03 NOTE — Patient Instructions (Addendum)
Continue Metformin 500 mg XR 1 tablet before Breakfast and 1 tablet before Supper  Continue Ozempic 0.5 mg weekly  Change Novolin Mix 36  units before  breakfast and 10 units before Supper     ADRENAL GLAND : STOP Prednisone  START Hydrocortisone 10 mg ONE and half tablets with Breakfast, HALF a tablet between 2-4 pm    ADRENAL INSUFFICIENCY SICK DAY RULES:  Should you face an extreme emotional or physical stress such as trauma, surgery or acute illness, this will require extra steroid coverage so that the body can meet that stress.   Without increasing the steroid dose you may experience severe weakness, headache, dizziness, nausea and vomiting and possibly a more serious deterioration in health.  Typically the dose of steroids will only need to be increased for a couple of days if you have an illness that is transient and managed in the community.   If you are unable to take/absorb an increased dose of steroids orally because of vomiting or diarrhea, you will urgently require steroid injections and should present to an Emergency Department.  The general advice for any serious illness is as follows: Double the normal daily steroid dose for up to 3 days if you have a temperature of more than 37.50C (99.69F) with signs of sickness, or severe emotional or physical distress Contact your primary care doctor and Endocrinologist if the illness worsens or it lasts for more than 3 days.  In cases of severe illness, urgent medical assistance should be promptly sought. If you experience vomiting/diarrhea or are unable to take steroids by mouth, please administer the Hydrocortisone injection kit and seek urgent medical help.     HOW TO TREAT LOW BLOOD SUGARS (Blood sugar LESS THAN 70 MG/DL) Please follow the RULE OF 15 for the treatment of hypoglycemia treatment (when your (blood sugars are less than 70 mg/dL)   STEP 1: Take 15 grams of carbohydrates when your blood sugar is low, which includes:   3-4 GLUCOSE TABS  OR 3-4 OZ OF JUICE OR REGULAR SODA OR ONE TUBE OF GLUCOSE GEL    STEP 2: RECHECK blood sugar in 15 MINUTES STEP 3: If your blood sugar is still low at the 15 minute recheck --> then, go back to STEP 1 and treat AGAIN with another 15 grams of carbohydrates.

## 2023-06-04 DIAGNOSIS — E2749 Other adrenocortical insufficiency: Secondary | ICD-10-CM | POA: Diagnosis not present

## 2023-06-04 LAB — CORTISOL: Cortisol, Plasma: 2.1 ug/dL — ABNORMAL LOW

## 2023-06-07 LAB — ACTH: C206 ACTH: 49 pg/mL (ref 6–50)

## 2023-06-19 ENCOUNTER — Other Ambulatory Visit: Payer: Self-pay | Admitting: *Deleted

## 2023-06-19 MED ORDER — DUPIXENT 300 MG/2ML ~~LOC~~ SOSY: 300.0000 mg | PREFILLED_SYRINGE | SUBCUTANEOUS | 11 refills | Status: DC

## 2023-06-20 DIAGNOSIS — E875 Hyperkalemia: Secondary | ICD-10-CM | POA: Diagnosis not present

## 2023-06-20 DIAGNOSIS — E1165 Type 2 diabetes mellitus with hyperglycemia: Secondary | ICD-10-CM | POA: Diagnosis not present

## 2023-06-20 DIAGNOSIS — E7849 Other hyperlipidemia: Secondary | ICD-10-CM | POA: Diagnosis not present

## 2023-06-20 DIAGNOSIS — K219 Gastro-esophageal reflux disease without esophagitis: Secondary | ICD-10-CM | POA: Diagnosis not present

## 2023-06-20 DIAGNOSIS — R946 Abnormal results of thyroid function studies: Secondary | ICD-10-CM | POA: Diagnosis not present

## 2023-06-25 ENCOUNTER — Ambulatory Visit (INDEPENDENT_AMBULATORY_CARE_PROVIDER_SITE_OTHER): Payer: Medicare HMO | Admitting: Gastroenterology

## 2023-06-28 ENCOUNTER — Other Ambulatory Visit: Payer: Self-pay

## 2023-06-28 ENCOUNTER — Telehealth: Payer: Self-pay | Admitting: Internal Medicine

## 2023-06-28 MED ORDER — SEMAGLUTIDE(0.25 OR 0.5MG/DOS) 2 MG/3ML ~~LOC~~ SOPN: 0.5000 mg | PEN_INJECTOR | SUBCUTANEOUS | 3 refills | Status: DC

## 2023-06-28 MED ORDER — NOVOLIN 70/30 FLEXPEN (70-30) 100 UNIT/ML ~~LOC~~ SUPN: PEN_INJECTOR | SUBCUTANEOUS | 4 refills | Status: DC

## 2023-06-28 NOTE — Telephone Encounter (Signed)
 Pt calling in regards to her inhaler Breztri. Az& ME needs a new script sent in

## 2023-07-02 ENCOUNTER — Ambulatory Visit (INDEPENDENT_AMBULATORY_CARE_PROVIDER_SITE_OTHER): Payer: HMO | Admitting: Gastroenterology

## 2023-07-02 ENCOUNTER — Encounter (INDEPENDENT_AMBULATORY_CARE_PROVIDER_SITE_OTHER): Payer: Self-pay | Admitting: Gastroenterology

## 2023-07-02 VITALS — BP 137/71 | HR 125 | Temp 97.8°F | Ht 62.0 in | Wt 156.8 lb

## 2023-07-02 DIAGNOSIS — K5900 Constipation, unspecified: Secondary | ICD-10-CM | POA: Diagnosis not present

## 2023-07-02 DIAGNOSIS — K581 Irritable bowel syndrome with constipation: Secondary | ICD-10-CM

## 2023-07-02 DIAGNOSIS — K219 Gastro-esophageal reflux disease without esophagitis: Secondary | ICD-10-CM

## 2023-07-02 DIAGNOSIS — R1012 Left upper quadrant pain: Secondary | ICD-10-CM | POA: Insufficient documentation

## 2023-07-02 NOTE — Patient Instructions (Signed)
-  Increase water  intake, aim for atleast 64 oz per day -Increase fruits, veggies and whole grains, kiwi and prunes are especially good for constipation -continue amitiza  8mcg Twice daily, we can discuss increasing dose at next visit if you are still having some harder stools -please keep track of things that seem to bring on your upper abdominal pain/anything that improves it. If symptoms worsen or you develop new associated symptoms, please let me know as we will need to schedule upper endoscopy for further evaluation -we will schedule Colonoscopy at follow up visit   Follow up 4 weeks  It was a pleasure to see you today. I want to create trusting relationships with patients and provide genuine, compassionate, and quality care. I truly value your feedback! please be on the lookout for a survey regarding your visit with me today. I appreciate your input about our visit and your time in completing this!    Emily Cavallaro L. Abdullahi Vallone, MSN, APRN, AGNP-C Adult-Gerontology Nurse Practitioner [facility] Gastroenterology at [facility]

## 2023-07-02 NOTE — Progress Notes (Addendum)
 Referring Provider: Dow Longs, PA-C Primary Care Physician:  Dow Longs, PA-C Primary GI Physician: Dr. Eartha   Chief Complaint  Patient presents with   Follow-up    Patient here today for an eight week follow up on IBS mix. Patient says symptoms are some better since starting Amitzia 8 mcg bid, She has also adjusted her diet.   HPI:   Emily Humphrey is a 73 y.o. female with past medical history of asthma, COPD, dyslipidemia, GERD, DM, OSA, IBS   Patient presenting today for follow up of IBS and with LUQ Pain   Patient last seen in November 2024, at that time having where she is constipation.  Had taken Linzess 72 mcg that PCP gave her without improvement.  Has tried stool softener, Dulcolax, Metamucil and MiraLAX as well without much improvement.  Having a lot of bloating and urge to defecate but trouble going.  Reports passing a lot of gas and had sour belching about a week prior though no recurrence of this.  Recommend abdominal x-ray, bowel prep of large stool burden, started general after bowel prep.  KUB on 04/30/2023 with stool noted within the colon with no definite radiographic finding of abnormal increase stool burden, stools in ascending colon and rectosigmoid colon.  Bowel gas pattern normal.  No evidence of free air.  Ibsrela  was denied by insurance therefore Amitiza  8 mcg twice daily was sent  Present: Doing well on amitiza  8mcg BID. Having a BM daily to every other day. Initially had some diarrhea but now having solid stools. Notes she may have to strain more about twice per week. She admittedly is not drinking as much water  as she previously was, drinking 40-50oz per day. Tries to get plenty of fruits, veggies in her diet.   She has also changed her diet and cut out spicy foods. She has some occasional LUQ pain, pain can occasionally wake her from sleep, 1-2 times per week.  Pain does not occur everyday. She has been trying to pinpoint specific triggering  factors. Nothing this a.m. she had some pain that seemed to improve after eating. She has history of GERD for which she takes protonix 40mg  and famotidine  20mg  at bedtime. She feels this regimen keeps her symptoms pretty well controlled. Denies rectal bleeding or melena. No nausea or vomiting. She does note that she has some early satiety but is on ozempic  and feels this started after she began this medication. No otc NSAIDs. Is taking meloxicam on PRN basis.    CT A/P w/contrast in November Colonic diverticulosis, without radiographic evidence of  diverticulitis or other acute findings.  Last Colonoscopy:07/2020  - Diverticulosis in the sigmoid colon.  - Four 4 to 12 mm polyps in the cecum, at the ileocecal valve, at 55 cm proximal to the anus and at  60 cm proximal to the anus. (Tubular adenomas) - Non-bleeding internal hemorrhoids.  Last Endoscopy: never    Recommendations:  Repeat TCS in feb 2025  Past Medical History:  Diagnosis Date   Abnormal gait    Asthma    Chest pain    Hospital, May, 2012,  Pericarditis   COPD (chronic obstructive pulmonary disease) (HCC)    Chronic steroid use   Dyslipidemia    Ejection fraction    Normal, echo, [id number]   GERD (gastroesophageal reflux disease)    History of tobacco abuse    IDDM (insulin  dependent diabetes mellitus)    Morbid obesity (HCC)    OSA (obstructive sleep apnea)  mild/not using C-PAP    Pericardial effusion    Small, echo, circumferential, May, 2012   Pericarditis    Hospitalization, May, 2012   Pneumonia 2010   Tremor     Past Surgical History:  Procedure Laterality Date   ABDOMINAL HYSTERECTOMY     NASAL SINUS SURGERY     Removal of throat nodules     vocal cored nodules    Current Outpatient Medications  Medication Sig Dispense Refill   ACCU-CHEK AVIVA PLUS test strip      albuterol  (VENTOLIN  HFA) 108 (90 Base) MCG/ACT inhaler Inhale 2 puffs into the lungs every 4 (four) hours as needed. 17 each 6    ALPRAZolam (XANAX) 0.25 MG tablet Take 0.25 mg by mouth daily as needed.     Budeson-Glycopyrrol-Formoterol (BREZTRI  AEROSPHERE) 160-9-4.8 MCG/ACT AERO Inhale 2 puffs into the lungs in the morning and at bedtime. 10.7 g 11   CALCIUM PO Take 1 tablet by mouth daily.     Continuous Blood Gluc Sensor (FREESTYLE LIBRE 3 SENSOR) MISC 1 Device by Does not apply route every 14 (fourteen) days. 6 each 3   donepezil  (ARICEPT ) 10 MG tablet Take 1 tablet (10 mg total) by mouth at bedtime. 30 tablet 11   dupilumab  (DUPIXENT ) 300 MG/2ML prefilled syringe Inject 300 mg into the skin every 14 (fourteen) days. 4 mL 11   famotidine  (PEPCID ) 20 MG tablet Take 1 tablet (20 mg total) by mouth daily. 30 tablet 5   gabapentin (NEURONTIN) 600 MG tablet Take 600 mg by mouth 3 (three) times daily.     HYDROcodone-acetaminophen (NORCO/VICODIN) 5-325 MG tablet Take 1 tablet by mouth every 6 (six) hours as needed.     hydrocortisone  (CORTEF ) 10 MG tablet Take 1 tablet (10 mg total) by mouth as directed. 1.5 tab QAM and half a tab between 2-4 pm 200 tablet 3   insulin  isophane & regular human KwikPen (NOVOLIN  70/30 KWIKPEN) (70-30) 100 UNIT/ML KwikPen Inject 36 Units into the skin daily before breakfast AND 10 Units daily before supper. 45 mL 4   Insulin  Pen Needle 32G X 4 MM MISC 1 Device by Does not apply route in the morning and at bedtime. 200 each 3   lubiprostone  (AMITIZA ) 8 MCG capsule Take 1 capsule (8 mcg total) by mouth 2 (two) times daily with a meal. 180 capsule 3   meloxicam (MOBIC) 7.5 MG tablet Take 7.5 mg by mouth 2 (two) times daily.     metFORMIN  (GLUCOPHAGE -XR) 500 MG 24 hr tablet Take 1 tablet (500 mg total) by mouth 2 (two) times daily. 180 tablet 2   pantoprazole (PROTONIX) 40 MG tablet Take 40 mg by mouth daily.     Potassium 99 MG TABS Take 1 tablet by mouth as needed.     Probiotic Product (PROBIOTIC DAILY PO) Take by mouth. One daily     Semaglutide ,0.25 or 0.5MG /DOS, 2 MG/3ML SOPN Inject 0.5 mg into  the skin once a week. 3 mL 3   simvastatin (ZOCOR) 20 MG tablet Take 20 mg by mouth daily.     traZODone (DESYREL) 50 MG tablet Take 50 mg by mouth at bedtime.     triamcinolone  cream (KENALOG) 0.1 % SMARTSIG:sparingly Topical Twice Daily     venlafaxine XR (EFFEXOR-XR) 75 MG 24 hr capsule Take 75 mg by mouth daily.     VITAMIN D, CHOLECALCIFEROL, PO Take by mouth daily at 6 (six) AM.     Current Facility-Administered Medications  Medication Dose Route Frequency  Provider Last Rate Last Admin   dupilumab  (DUPIXENT ) prefilled syringe 300 mg  300 mg Subcutaneous Q14 Days Cari Arlean HERO, FNP   300 mg at 11/06/21 1717    Allergies as of 07/02/2023 - Review Complete 07/02/2023  Allergen Reaction Noted   Atorvastatin  01/22/2017   Empagliflozin  07/06/2021   Penicillins Itching 12/13/2010   Sulfa antibiotics Itching 12/26/2010    Family History  Problem Relation Age of Onset   Heart attack Mother        deceased at age 43   Asthma Mother    Other Father        deceased at age 4   Diabetes Sister     Social History   Socioeconomic History   Marital status: Divorced    Spouse name: Not on file   Number of children: 2   Years of education: 12   Highest education level: Not on file  Occupational History   Occupation: RETIRED    Comment: 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   Caffeine 1 c daily    Social Drivers of Corporate Investment Banker Strain: Not on file  Food Insecurity: Not on file  Transportation Needs: Not on file  Physical Activity: Not on file  Stress: Not on file  Social Connections: Not on file    Review of systems General: negative for malaise, night sweats, fever, chills, weight loss Neck: Negative for lumps, goiter, pain and significant neck swelling Resp: Negative for cough, wheezing, dyspnea at rest CV: Negative for chest pain, leg swelling, palpitations, orthopnea GI: denies melena, hematochezia, nausea, vomiting, diarrhea, dysphagia, odyonophagia, early satiety or unintentional weight loss. +constipation +LUQ Pain  MSK: Negative for joint pain or swelling, back pain, and muscle pain. Derm: Negative for itching or rash Psych: Denies depression, anxiety, memory loss, confusion. No homicidal or suicidal ideation.  Heme: Negative for prolonged bleeding, bruising easily, and swollen nodes. Endocrine: Negative for cold or heat intolerance, polyuria, polydipsia and goiter. Neuro: negative for tremor, gait imbalance, syncope and seizures. The remainder of the review of systems is noncontributory.  Physical Exam: BP 137/71 (BP Location: Left Arm, Patient Position: Sitting, Cuff Size: Normal)   Pulse (!) 125   Temp 97.8 F (36.6 C) (Temporal)   Ht 5' 2 (1.575 m)   Wt 156 lb 12.8 oz (71.1 kg)   BMI 28.68 kg/m  General:   Alert and oriented. No distress noted. Pleasant and cooperative.  Head:  Normocephalic and atraumatic. Eyes:  Conjuctiva clear without scleral icterus. Mouth:  Oral mucosa pink and moist. Good dentition. No lesions. Heart: Normal rate and rhythm, s1 and s2 heart sounds present.  Lungs: Clear lung sounds in all lobes. Respirations equal and unlabored. Abdomen:  +BS, soft, and  non-distended. +mild TTP of LUQ with deep palpation. No rebound or guarding. No HSM or masses noted. Derm: No palmar erythema or jaundice Msk:  Symmetrical without  gross deformities. Normal posture. Extremities:  Without edema. Neurologic:  Alert and  oriented x4 Psych:  Alert and cooperative. Normal mood and affect.  Invalid input(s): 6 MONTHS   ASSESSMENT: Namira Rosekrans is a 73 y.o. female presenting today for follow up of constipation, also with LUQ Pain  Constipation: Improved with Amitiza  8 mcg twice daily, having a BM usually daily to every other day.  Noting she may have to strain 1-2 times per week but overall feeling better on Amitiza .  Water  intake is about 40 to 50 ounces per day though less than previously what she was doing which was about 80 ounces per day.  For now we will continue with Amitiza  8 mcg twice daily, should try to increase water  intake as well as continue with diet high in fruits, veggies, whole grains.  May consider increasing Amitiza  to 24 mcg twice daily at next visit if she is continue to have harder stools.  LUQ Pain: New onset left upper quadrant pain that is intermittent.  No radiation of pain.  Appears that pain sometimes improves with eating and does not occur daily.  No nausea, vomiting, rectal bleeding, melena, over weight loss.  She feels that GERD is well-controlled on Protonix and famotidine  daily.  She is in the process of trying to make some dietary changes with cutting out spicy foods.  I discussed with the patient scheduling upper endoscopy for further evaluation to rule out gastritis, duodenitis, PUD, however at this time patient would like to keep an eye on her symptoms and let me know if left upper quadrant pain persist or worsens.  If she continues to have this pain at follow-up we will need to strongly consider upper endoscopy for evaluation.  She is due for colonoscopy next month, will schedule this at follow up in case she needs EGD, as above, at which time both procedures can be performed together.    PLAN:  -Increase water  intake, aim for atleast 64 oz per day Increase fruits, veggies and whole grains,  kiwi and prunes are especially good for constipation -continue amitiza  8mcg BID, may increase to 24mcg BID at follow up  -consider EGD at follow up if LUQ pain persits or worsening symptoms.  -schedule colonoscopy at follow up  All questions were answered, patient verbalized understanding and is in agreement with plan as outlined above.   Follow Up: 4 weeks   Godfrey Tritschler L. Mariette, MSN, APRN, AGNP-C Adult-Gerontology Nurse Practitioner [facility]  I have reviewed the note and agree with the APP's assessment as described in this progress note  Toribio Fortune, MD Gastroenterology and Hepatology [facility]

## 2023-07-10 NOTE — Telephone Encounter (Signed)
This pt has not been see since 2023 and will need appt scheduled  I have called and left her a detailed msg letting her know to call for appt  Will address meds with her when she is present for an office visit  Closing encounter

## 2023-07-14 NOTE — Progress Notes (Unsigned)
Emily Humphrey, female    DOB: Dec 22, 1950, 73 y.o.   MRN: [id number]   Brief patient profile:  72 yowf  quit smoking 1996   self-referred to pulmonary clinic in [place]  02/17/2021 for copd with symptoms starting 2 m p quit smoking with initial dx asthma/ sinus dz at wt = 140 and daily symptoms on prednisone ever since.   Sinus surgery x early 2000s  = dx polyps / recurrent    History of Present Illness  02/17/2021  Pulmonary/ 1st office eval/ Raylyn Carton / [place] [facility]  Chief Complaint  Patient presents with   Consult    Patient has Asthma and COPD. Shortness of breath with exertion and chest tightness. Dry cough  Dyspnea:  mb and back to house wears her out even on pred 40 Cough: dry cough  Sleep: on side bed is horizontal / 2 pillows  SABA use: avg saba hfa sev times plus neb bid  Had hb resolved on ppi bid  Rec We will be referring you to allergy here in [place] > done 04/14/21  Pantoprazole (protonix) 40 mg  Take  30-60 min before first meal of the day and Pepcid (famotidine)  20 mg after supper until return to office GERD diet reviewed, bed blocks rec   Plan A = Automatic = Always=     Try breztri Take 2 puffs first thing in am and then another 2 puffs about 12 hours later.  Plan B = Backup (to supplement plan A, not to replace it) Only use your albuterol inhaler as a rescue medication Plan C = Crisis (instead of Plan B but only if Plan B stops working) - only use your albuterol nebulizer if you first try Plan B t Also Ok to try albuterol 15 min before an activity (on alternating days)  that you know would usually make you short of breath    Plan D = Deltasone = prednisone >>> double the dose you usually take until better then one daily with breafast         08/09/2021  f/u ov/[place] office/Artemisa Sladek re: doe s obst/ maint on breztri free from [place] but   still on prednisone 10 mg daily for asthma by PCP - was prev told could never stop it due to adrenal insuff Chief Complaint   Patient presents with   Follow-up    SOB and cough have improved a little since last OV.    Dyspnea:  not limited by breathing but by legs and feet  Cough: not much/ lots of nasal congestion Sleeping: flat  bed with 2-3 pillows  SABA use: nebulizer twice weekly  02: none  Covid status: "all the vax"   Rec No change in medications See Dr Dellis Anes for dupixent injections  or alternative to reduce your need for prednisoe for asthma and polyps  07/16/2023  Re-establish  ov/[place] office/Kyng Matlock re:  maint on dupixent and breztri   Chief Complaint  Patient presents with   Follow-up  Dyspnea:  Not limited by breathing from desired activities  but by knees/ hips / plantar fascitis  Cough: none/ still has nasal congest / no ent  Sleeping: flat bed, 203 pillows s    resp cc  SABA use: none  02: none      No obvious day to day or daytime variability or assoc excess/ purulent sputum or mucus plugs or hemoptysis or cp or chest tightness, subjective wheeze or overt sinus or hb symptoms.    Also denies any  obvious fluctuation of symptoms with weather or environmental changes or other aggravating or alleviating factors except as outlined above   No unusual exposure hx or h/o childhood pna/ asthma or knowledge of premature birth.  Current Allergies, Complete Past Medical History, Past Surgical History, Family History, and Social History were reviewed in [employer] record.  ROS  The following are not active complaints unless bolded Hoarseness, sore throat, dysphagia, dental problems, itching, sneezing,  nasal congestion or discharge of excess mucus or purulent secretions, ear ache,   fever, chills, sweats, unintended wt loss or wt gain, classically pleuritic or exertional cp,  orthopnea pnd or arm/hand swelling  or leg swelling, presyncope, palpitations, abdominal pain, anorexia, nausea, vomiting, diarrhea  or change in bowel habits or change in bladder habits, change in  stools or change in urine, dysuria, hematuria,  rash, arthralgias, visual complaints, headache, numbness, weakness or ataxia or problems with walking or coordination,  change in mood or  memory.        Current Meds  Medication Sig   ACCU-CHEK AVIVA PLUS test strip    albuterol (VENTOLIN HFA) 108 (90 Base) MCG/ACT inhaler Inhale 2 puffs into the lungs every 4 (four) hours as needed.   ALPRAZolam (XANAX) 0.25 MG tablet Take 0.25 mg by mouth daily as needed.   Budeson-Glycopyrrol-Formoterol (BREZTRI AEROSPHERE) 160-9-4.8 MCG/ACT AERO Inhale 2 puffs into the lungs in the morning and at bedtime.   CALCIUM PO Take 1 tablet by mouth daily.   Continuous Blood Gluc Sensor (FREESTYLE LIBRE 3 SENSOR) MISC 1 Device by Does not apply route every 14 (fourteen) days.   Continuous Glucose Receiver (FREESTYLE LIBRE 14 DAY READER) DEVI by Does not apply route.   Continuous Glucose Sensor (FREESTYLE LIBRE 2 SENSOR) MISC by Does not apply route.   donepezil (ARICEPT) 10 MG tablet Take 1 tablet (10 mg total) by mouth at bedtime.   dupilumab (DUPIXENT) 300 MG/2ML prefilled syringe Inject 300 mg into the skin every 14 (fourteen) days.   famotidine (PEPCID) 20 MG tablet Take 1 tablet (20 mg total) by mouth daily.   gabapentin (NEURONTIN) 600 MG tablet Take 600 mg by mouth 3 (three) times daily.   HYDROcodone-acetaminophen (NORCO/VICODIN) 5-325 MG tablet Take 1 tablet by mouth every 6 (six) hours as needed.   hydrocortisone (CORTEF) 10 MG tablet Take 1 tablet (10 mg total) by mouth as directed. 1.5 tab QAM and half a tab between 2-4 pm   insulin isophane & regular human KwikPen (NOVOLIN 70/30 KWIKPEN) (70-30) 100 UNIT/ML KwikPen Inject 36 Units into the skin daily before breakfast AND 10 Units daily before supper.   Insulin Pen Needle 32G X 4 MM MISC 1 Device by Does not apply route in the morning and at bedtime.   lubiprostone (AMITIZA) 8 MCG capsule Take 1 capsule (8 mcg total) by mouth 2 (two) times daily with a  meal.   meloxicam (MOBIC) 7.5 MG tablet Take 7.5 mg by mouth 2 (two) times daily.   metFORMIN (GLUCOPHAGE-XR) 500 MG 24 hr tablet Take 1 tablet (500 mg total) by mouth 2 (two) times daily.   pantoprazole (PROTONIX) 40 MG tablet Take 40 mg by mouth daily.   Potassium 99 MG TABS Take 1 tablet by mouth as needed.   Probiotic Product (PROBIOTIC DAILY PO) Take by mouth. One daily   Semaglutide,0.25 or 0.5MG /DOS, 2 MG/3ML SOPN Inject 0.5 mg into the skin once a week.   simvastatin (ZOCOR) 20 MG tablet Take 20 mg by mouth  daily.   traZODone (DESYREL) 50 MG tablet Take 50 mg by mouth at bedtime.   triamcinolone cream (KENALOG) 0.1 % SMARTSIG:sparingly Topical Twice Daily   venlafaxine XR (EFFEXOR-XR) 75 MG 24 hr capsule Take 75 mg by mouth daily.   VITAMIN D, CHOLECALCIFEROL, PO Take by mouth daily at 6 (six) AM.   Current Facility-Administered Medications for the 07/16/23 encounter (Office Visit) with Nyoka Cowden, MD  Medication   dupilumab (DUPIXENT) prefilled syringe 300 mg                Past Medical History:  Diagnosis Date   Abnormal gait    Asthma    Chest pain    Hospital, May, 2012,  Pericarditis   COPD (chronic obstructive pulmonary disease) (HCC)    Chronic steroid use   Dyslipidemia    Ejection fraction    Normal, echo, [id number]   GERD (gastroesophageal reflux disease)    History of tobacco abuse    IDDM (insulin dependent diabetes mellitus)    Morbid obesity (HCC)    OSA (obstructive sleep apnea)    mild/not using C-PAP    Pericardial effusion    Small, echo, circumferential, May, 2012   Pericarditis    Hospitalization, May, 2012   Pneumonia 2010   Tremor         Objective:     Wts   07/16/2023        155   04/14/21 168 lb 9.6 oz (76.5 kg)  02/17/21 165 lb (74.8 kg)  09/06/20 175 lb (79.4 kg)       HEENT : Oropharynx  clear   Nasal turbinates nl / no polyps viz   NECK :  without  apparent JVD/ palpable Nodes/TM    LUNGS: no acc muscle use,   Min barrel  contour chest wall with bilateral  slightly decreased bs s audible wheeze and  without cough on insp or exp maneuvers and min  Hyperresonant  to  percussion bilaterally    CV:  RRR  no s3 or murmur or increase in P2, and no edema   ABD:  soft and nontender with pos end  insp Hoover's  in the supine position.  No bruits or organomegaly appreciated   MS:  Nl gait/ ext warm without deformities Or obvious joint restrictions  calf tenderness, cyanosis or clubbing     SKIN: warm and dry without lesions    NEURO:  alert, approp, nl sensorium with  no motor or cerebellar deficits apparent.             Assessment

## 2023-07-16 ENCOUNTER — Encounter: Payer: Self-pay | Admitting: Internal Medicine

## 2023-07-16 ENCOUNTER — Ambulatory Visit: Payer: PPO | Admitting: Internal Medicine

## 2023-07-16 VITALS — BP 133/77 | HR 107 | Ht 62.0 in | Wt 155.0 lb

## 2023-07-16 DIAGNOSIS — R0609 Other forms of dyspnea: Secondary | ICD-10-CM

## 2023-07-16 NOTE — Patient Instructions (Signed)
Stop pm dose of Breztri   After a month if doing as well drop breztri to 1 puff in am  and after a month change breztri to wear you take it up to 2 puffs every 12 hours as needed   Blow breztri  out thru nose  Pulmonary follow up is as needed

## 2023-07-16 NOTE — Assessment & Plan Note (Signed)
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  - referred to Allergy 02/17/2021  With Eos 0.4  IGE 403>  seen 04/14/21  - 02/17/2021   Walked on RA x  2  lap(s) =  approx 300 @ slow pace, stopped due to sob with lowest 02 sats 100%  - 02/17/2021  After extensive coaching inhaler device,  effectiveness =    75% > try breztri in place of trelegy x 2 week samples and fill rx if helping  -  02/17/21     alpha one AT phenotype  MM  level 142 -  04/07/21  Nl "V/Q"  - .04/14/21 spirometry s obst p am breztri   >>>    Spirometry 02/22/23 min airflow obst off  AM breztri Ok to wean off if tol since doing so well on Dupixient per allergy   07/16/2023  After extensive coaching inhaler device,  effectiveness =    80% (short Ti)   She is clearly asthmatic, not copd, based on spirometry post Breztri normalizing and off breztri with min residual airflow obstruction and probably would do just as well on prn symbicort 80 Based on two studies from NEJM  378; 20 p 1865 (2018) and 380 : p2020-30 (2019) in pts with mild asthma it is reasonable to use low dose symbicort eg 80 2bid "prn" flare in this setting but I emphasized this was only shown with symbicort and takes advantage of the rapid onset of action but is not the same as "rescue therapy" but can be stopped once the acute symptoms have resolved and the need for rescue has been minimized (< 2 x weekly)    Pulmonary f/u is prn as long as being followed by allergy          Each maintenance medication was reviewed in detail including emphasizing most importantly the difference between maintenance and prns and under what circumstances the prns are to be triggered using an action plan format where appropriate.  Total time for H and P, chart review, counseling, reviewing hfa  device(s) and generating customized AVS unique to this office visit / same day charting = 21 min final summary f/u

## 2023-07-18 DIAGNOSIS — G629 Polyneuropathy, unspecified: Secondary | ICD-10-CM | POA: Diagnosis not present

## 2023-07-18 DIAGNOSIS — F411 Generalized anxiety disorder: Secondary | ICD-10-CM | POA: Diagnosis not present

## 2023-07-18 DIAGNOSIS — Z6826 Body mass index (BMI) 26.0-26.9, adult: Secondary | ICD-10-CM | POA: Diagnosis not present

## 2023-07-18 DIAGNOSIS — E1165 Type 2 diabetes mellitus with hyperglycemia: Secondary | ICD-10-CM | POA: Diagnosis not present

## 2023-07-18 DIAGNOSIS — M2550 Pain in unspecified joint: Secondary | ICD-10-CM | POA: Diagnosis not present

## 2023-07-19 DIAGNOSIS — R300 Dysuria: Secondary | ICD-10-CM | POA: Diagnosis not present

## 2023-07-19 DIAGNOSIS — N390 Urinary tract infection, site not specified: Secondary | ICD-10-CM | POA: Diagnosis not present

## 2023-07-19 DIAGNOSIS — R3589 Other polyuria: Secondary | ICD-10-CM | POA: Diagnosis not present

## 2023-07-23 ENCOUNTER — Telehealth: Payer: Self-pay

## 2023-07-23 NOTE — Telephone Encounter (Signed)
Ozempic needs PA  

## 2023-07-25 ENCOUNTER — Telehealth: Payer: Self-pay

## 2023-07-25 ENCOUNTER — Other Ambulatory Visit (HOSPITAL_COMMUNITY): Payer: Self-pay

## 2023-07-25 NOTE — Telephone Encounter (Signed)
 Pharmacy Patient Advocate Encounter   Received notification from Pt Calls Messages that prior authorization for OZempic  is required/requested.   Insurance verification completed.   The patient is insured through [facility] ADVANTAGE/RX ADVANCE .   Per test claim: The current 30 day co-pay is, $47.  No PA needed at this time. This test claim was processed through [facility]- copay amounts may vary at other pharmacies due to pharmacy/plan contracts, or as the patient moves through the different stages of their insurance plan.

## 2023-07-30 DIAGNOSIS — Z0001 Encounter for general adult medical examination with abnormal findings: Secondary | ICD-10-CM | POA: Diagnosis not present

## 2023-07-30 DIAGNOSIS — Z6826 Body mass index (BMI) 26.0-26.9, adult: Secondary | ICD-10-CM | POA: Diagnosis not present

## 2023-08-01 ENCOUNTER — Encounter (INDEPENDENT_AMBULATORY_CARE_PROVIDER_SITE_OTHER): Payer: Self-pay | Admitting: Gastroenterology

## 2023-08-01 ENCOUNTER — Ambulatory Visit (INDEPENDENT_AMBULATORY_CARE_PROVIDER_SITE_OTHER): Payer: HMO | Admitting: Gastroenterology

## 2023-08-01 VITALS — BP 105/63 | HR 94 | Temp 98.4°F | Ht 62.0 in | Wt 150.2 lb

## 2023-08-01 DIAGNOSIS — K581 Irritable bowel syndrome with constipation: Secondary | ICD-10-CM

## 2023-08-01 DIAGNOSIS — Z860101 Personal history of adenomatous and serrated colon polyps: Secondary | ICD-10-CM

## 2023-08-01 DIAGNOSIS — K219 Gastro-esophageal reflux disease without esophagitis: Secondary | ICD-10-CM

## 2023-08-01 DIAGNOSIS — R1012 Left upper quadrant pain: Secondary | ICD-10-CM | POA: Diagnosis not present

## 2023-08-01 MED ORDER — PEG 3350-KCL-NA BICARB-NACL 420 G PO SOLR: 4000.0000 mL | Freq: Once | ORAL | 0 refills | Status: AC

## 2023-08-01 NOTE — Patient Instructions (Signed)
We will get you scheduled for EGD and colonoscopy Please let me know if you are unable to obtain your amitiza at a reasonable cost as we can try another alternative Increase water intake, aim for atleast 64 oz per day Increase fruits, veggies and whole grains, kiwi and prunes are especially good for constipation Be mindful of greasy, spicy, tomato/citrus based foods, caffeine, chocolate and alcohol as these can worsen reflux symptoms, stay upright to 3 hours after eating prior to lying down and avoid eating late.  Follow-up 6 months  It was a pleasure to see you today. I want to create trusting relationships with patients and provide genuine, compassionate, and quality care. I truly value your feedback! please be on the lookout for a survey regarding your visit with me today. I appreciate your input about our visit and your time in completing this!    Reshma Hoey L. Jeanmarie Hubert, MSN, APRN, AGNP-C Adult-Gerontology Nurse Practitioner [facility]

## 2023-08-01 NOTE — H&P (View-Only) (Signed)
 Referring Provider: Lianne Moris, PA-C Primary Care Physician:  Lianne Moris, PA-C Primary GI Physician: Dr. Tasia Catchings  Chief Complaint  Patient presents with   Irritable Bowel Syndrome    Follow up on IBS with constipation.  Still having pain on left side. Has been watching diet and it has helped.    HPI:   Emily Humphrey is a 73 y.o. female with past medical history of  asthma, COPD, dyslipidemia, GERD, DM, OSA, IBS   Patient presenting today for follow-up of IBS, left upper quadrant pain and to schedule colonoscopy  Patient last seen 07/02/2023, at that time doing well on Amitiza 8 mcg twice daily, having 1 BM daily to every other day.  Initially had some diarrhea but now with more solid stools.  Patient also reported she has changed her diet cut out spicy foods, having occasional left upper quadrant pain.  Taking Protonix 40 mg daily and famotidine 20 mg at bedtime which seems to keep her symptoms well-controlled.  Has some early satiety but feels this was related to starting Ozempic.  Patient recommended to increase water intake, crease fruits, veggies, whole grains in diet, continue Amitiza 8 mcg twice daily may increase to 24 mcg twice daily at follow-up, consider EGD at follow-up if left upper quadrant pain persist or worsening, schedule colonoscopy follow-up.  Present: Still having some LUQ pain/tenderness at times but less than previously.  Has tried to change her diet and has been unable to pinpoint any specific triggering factors.  Denies any changes in appetite, early satiety, nausea, vomiting.  Was doing amitiza BID, she states she was doing well on this but ran out about 1 week ago and did not really have the money to get it refilled. She states there was an issues when she changed insurance and there was an issue with the pharmacy which she is hoping will be straightened out so that she can obtain her Amitiza at a reasonable cost. She notes she was doing well and having  regular BMs when on this.no rectal bleeding or melena.   She denies any GERD symptoms on protonix 40mg .  No dysphagia or odynophagia.  CT A/P w/contrast in November Colonic diverticulosis, without radiographic evidence of  diverticulitis or other acute findings.  Last Colonoscopy:07/2020  - Diverticulosis in the sigmoid colon.  - Four 4 to 12 mm polyps in the cecum, at the ileocecal valve, at 55 cm proximal to the anus and at  60 cm proximal to the anus. (Tubular adenomas) - Non-bleeding internal hemorrhoids.  Last Endoscopy: never    Recommendations:  Repeat TCS in feb 2025   Past Medical History:  Diagnosis Date   Abnormal gait    Asthma    Chest pain    Hospital, May, 2012,  Pericarditis   COPD (chronic obstructive pulmonary disease) (HCC)    Chronic steroid use   Dyslipidemia    Ejection fraction    Normal, echo, [facility],1191   GERD (gastroesophageal reflux disease)    History of tobacco abuse    IDDM (insulin dependent diabetes mellitus)    Morbid obesity (HCC)    OSA (obstructive sleep apnea)    mild/not using C-PAP    Pericardial effusion    Small, echo, circumferential, May, 2012   Pericarditis    Hospitalization, May, 2012   Pneumonia 2010   Tremor     Past Surgical History:  Procedure Laterality Date   ABDOMINAL HYSTERECTOMY     NASAL SINUS SURGERY  Removal of throat nodules     vocal cored nodules    Current Outpatient Medications  Medication Sig Dispense Refill   ACCU-CHEK AVIVA PLUS test strip      albuterol (VENTOLIN HFA) 108 (90 Base) MCG/ACT inhaler Inhale 2 puffs into the lungs every 4 (four) hours as needed. 17 each 6   ALPRAZolam (XANAX) 0.25 MG tablet Take 0.25 mg by mouth daily as needed.     Budeson-Glycopyrrol-Formoterol (BREZTRI AEROSPHERE) 160-9-4.8 MCG/ACT AERO Inhale 2 puffs into the lungs in the morning and at bedtime. 10.7 g 11   CALCIUM PO Take 1 tablet by mouth daily.     Continuous Blood Gluc Sensor (FREESTYLE LIBRE 3 SENSOR) MISC  1 Device by Does not apply route every 14 (fourteen) days. 6 each 3   Continuous Glucose Receiver (FREESTYLE LIBRE 14 DAY READER) DEVI by Does not apply route.     Continuous Glucose Sensor (FREESTYLE LIBRE 2 SENSOR) MISC by Does not apply route.     donepezil (ARICEPT) 10 MG tablet Take 1 tablet (10 mg total) by mouth at bedtime. 30 tablet 11   dupilumab (DUPIXENT) 300 MG/2ML prefilled syringe Inject 300 mg into the skin every 14 (fourteen) days. 4 mL 11   famotidine (PEPCID) 20 MG tablet Take 1 tablet (20 mg total) by mouth daily. 30 tablet 5   gabapentin (NEURONTIN) 600 MG tablet Take 600 mg by mouth 3 (three) times daily.     HYDROcodone-acetaminophen (NORCO/VICODIN) 5-325 MG tablet Take 1 tablet by mouth every 6 (six) hours as needed.     hydrocortisone (CORTEF) 10 MG tablet Take 1 tablet (10 mg total) by mouth as directed. 1.5 tab QAM and half a tab between 2-4 pm 200 tablet 3   insulin isophane & regular human KwikPen (NOVOLIN 70/30 KWIKPEN) (70-30) 100 UNIT/ML KwikPen Inject 36 Units into the skin daily before breakfast AND 10 Units daily before supper. 45 mL 4   Insulin Pen Needle 32G X 4 MM MISC 1 Device by Does not apply route in the morning and at bedtime. 200 each 3   lubiprostone (AMITIZA) 8 MCG capsule Take 1 capsule (8 mcg total) by mouth 2 (two) times daily with a meal. 180 capsule 3   meloxicam (MOBIC) 7.5 MG tablet Take 7.5 mg by mouth 2 (two) times daily.     metFORMIN (GLUCOPHAGE-XR) 500 MG 24 hr tablet Take 1 tablet (500 mg total) by mouth 2 (two) times daily. 180 tablet 2   pantoprazole (PROTONIX) 40 MG tablet Take 40 mg by mouth daily.     Potassium 99 MG TABS Take 1 tablet by mouth as needed.     Probiotic Product (PROBIOTIC DAILY PO) Take by mouth. One daily     Semaglutide,0.25 or 0.5MG /DOS, 2 MG/3ML SOPN Inject 0.5 mg into the skin once a week. 3 mL 3   simvastatin (ZOCOR) 20 MG tablet Take 20 mg by mouth daily.     traZODone (DESYREL) 50 MG tablet Take 50 mg by mouth  at bedtime.     triamcinolone cream (KENALOG) 0.1 % SMARTSIG:sparingly Topical Twice Daily     venlafaxine XR (EFFEXOR-XR) 75 MG 24 hr capsule Take 75 mg by mouth daily.     VITAMIN D, CHOLECALCIFEROL, PO Take by mouth daily at 6 (six) AM.     Current Facility-Administered Medications  Medication Dose Route Frequency Provider Last Rate Last Admin   dupilumab (DUPIXENT) prefilled syringe 300 mg  300 mg Subcutaneous Q14 Days Ambs, Norvel Richards, FNP  300 mg at 11/06/21 1717    Allergies as of 08/01/2023 - Review Complete 08/01/2023  Allergen Reaction Noted   Atorvastatin  01/22/2017   Empagliflozin  07/06/2021   Penicillins Itching 12/13/2010   Sulfa antibiotics Itching 12/26/2010    Family History  Problem Relation Age of Onset   Heart attack Mother        deceased at age 39   Asthma Mother    Other Father        deceased at age 26   Diabetes Sister     Social History   Socioeconomic History   Marital status: Divorced    Spouse name: Not on file   Number of children: 2   Years of education: 12   Highest education level: Not on file  Occupational History   Occupation: RETIRED    Comment: 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   Caffeine 1 c daily   Social Drivers of Corporate investment banker Strain: Not on file  Food Insecurity: Not on file  Transportation Needs: Not on file  Physical  Activity: Not on file  Stress: Not on file  Social Connections: Not on file    Review of systems General: negative for malaise, night sweats, fever, chills, weight loss Neck: Negative for lumps, goiter, pain and significant neck swelling Resp: Negative for cough, wheezing, dyspnea at rest CV: Negative for chest pain, leg swelling, palpitations, orthopnea GI: denies melena, hematochezia, nausea, vomiting, diarrhea, dysphagia, odyonophagia, early satiety or unintentional weight loss.  + Left upper quadrant pain + constipation The remainder of the review of systems is noncontributory.  Physical Exam: There were no vitals taken for this visit. General:   Alert and oriented. No distress noted. Pleasant and cooperative.  Head:  Normocephalic and atraumatic. Eyes:  Conjuctiva clear without scleral icterus. Mouth:  Oral mucosa pink and moist. Good dentition. No lesions. Heart: Normal rate and rhythm, s1 and s2 heart sounds present.  Lungs: Clear lung sounds in all lobes. Respirations equal and unlabored. Abdomen:  +BS, soft, and non-distended.  Mild tenderness to upper quadrant.  No rebound or guarding. No HSM or masses noted. Neurologic:  Alert and  oriented x4 Psych:  Alert and cooperative. Normal mood and affect.  Invalid input(s): "6 MONTHS"   ASSESSMENT: Emily Humphrey is a 73 y.o. female presenting today for follow-up of left upper quadrant pain and IBS and schedule colonoscopy  IBS: Constipation was well-managed on Amitiza 8 mcg twice daily though she reports she recently ran out and was able to afford refilling this, now having sporadic bowel movements.  She felt that she was doing well on Amitiza and would like to continue on this.  She has had a change in insurance and is hoping that  she could now get this at a reasonable cost.  I instructed patient to let me know if she is unable to afford Amitiza and recent call so we can try to switch her to another alternative for  constipation.  History of tubular adenoma colon: Last colonoscopy in 2022 with 4 tubular adenomas, recommended to have repeat colonoscopy in 3 years.  She is amenable to scheduling colonoscopy.  Left upper quadrant pain: Patient has had some intermittent, ongoing left upper quadrant pain though seems less frequent than at last visit.  She denies any associated nausea, vomiting, early satiety, weight loss, melena.  We did discuss at last visit potentially performing EGD for further evaluation of her symptoms at time of colonoscopy if pain persisted which I would recommend doing at this time as I cannot rule out gastritis, PUD, duodenitis.  Patient is agreeable to proceed with upper endoscopy at time of colonoscopy  Indications, risks and benefits of procedure discussed in detail with patient. Patient verbalized understanding and is in agreement to proceed with EGD/colon.    PLAN:  - continue protonix 40mg  and famotidine -resume amitiza 8 mcg twice daily if able, patient to let me know if she is unable to obtain this for reasonable cost -Good reflux precautions -schedule colonoscopy and EGD-ASA 2 -Increase water intake, aim for atleast 64 oz per day -Increase fruits, veggies and whole grains, kiwi and prunes are especially good for constipation  All questions were answered, patient verbalized understanding and is in agreement with plan as outlined above.   Follow Up: 6 months  Jymir Dunaj L. Jeanmarie Hubert, MSN, APRN, AGNP-C Adult-Gerontology Nurse Practitioner [facility]

## 2023-08-01 NOTE — Progress Notes (Signed)
Referring Provider: Lianne Moris, PA-C Primary Care Physician:  Lianne Moris, PA-C Primary GI Physician: Dr. Tasia Catchings  Chief Complaint  Patient presents with   Irritable Bowel Syndrome    Follow up on IBS with constipation.  Still having pain on left side. Has been watching diet and it has helped.    HPI:   Emily Humphrey is a 73 y.o. female with past medical history of  asthma, COPD, dyslipidemia, GERD, DM, OSA, IBS   Patient presenting today for follow-up of IBS, left upper quadrant pain and to schedule colonoscopy  Patient last seen 07/02/2023, at that time doing well on Amitiza 8 mcg twice daily, having 1 BM daily to every other day.  Initially had some diarrhea but now with more solid stools.  Patient also reported she has changed her diet cut out spicy foods, having occasional left upper quadrant pain.  Taking Protonix 40 mg daily and famotidine 20 mg at bedtime which seems to keep her symptoms well-controlled.  Has some early satiety but feels this was related to starting Ozempic.  Patient recommended to increase water intake, crease fruits, veggies, whole grains in diet, continue Amitiza 8 mcg twice daily may increase to 24 mcg twice daily at follow-up, consider EGD at follow-up if left upper quadrant pain persist or worsening, schedule colonoscopy follow-up.  Present: Still having some LUQ pain/tenderness at times but less than previously.  Has tried to change her diet and has been unable to pinpoint any specific triggering factors.  Denies any changes in appetite, early satiety, nausea, vomiting.  Was doing amitiza BID, she states she was doing well on this but ran out about 1 week ago and did not really have the money to get it refilled. She states there was an issues when she changed insurance and there was an issue with the pharmacy which she is hoping will be straightened out so that she can obtain her Amitiza at a reasonable cost. She notes she was doing well and having  regular BMs when on this.no rectal bleeding or melena.   She denies any GERD symptoms on protonix 40mg .  No dysphagia or odynophagia.  CT A/P w/contrast in November Colonic diverticulosis, without radiographic evidence of  diverticulitis or other acute findings.  Last Colonoscopy:07/2020  - Diverticulosis in the sigmoid colon.  - Four 4 to 12 mm polyps in the cecum, at the ileocecal valve, at 55 cm proximal to the anus and at  60 cm proximal to the anus. (Tubular adenomas) - Non-bleeding internal hemorrhoids.  Last Endoscopy: never    Recommendations:  Repeat TCS in feb 2025   Past Medical History:  Diagnosis Date   Abnormal gait    Asthma    Chest pain    Hospital, May, 2012,  Pericarditis   COPD (chronic obstructive pulmonary disease) (HCC)    Chronic steroid use   Dyslipidemia    Ejection fraction    Normal, echo, [facility],1191   GERD (gastroesophageal reflux disease)    History of tobacco abuse    IDDM (insulin dependent diabetes mellitus)    Morbid obesity (HCC)    OSA (obstructive sleep apnea)    mild/not using C-PAP    Pericardial effusion    Small, echo, circumferential, May, 2012   Pericarditis    Hospitalization, May, 2012   Pneumonia 2010   Tremor     Past Surgical History:  Procedure Laterality Date   ABDOMINAL HYSTERECTOMY     NASAL SINUS SURGERY  Removal of throat nodules     vocal cored nodules    Current Outpatient Medications  Medication Sig Dispense Refill   ACCU-CHEK AVIVA PLUS test strip      albuterol (VENTOLIN HFA) 108 (90 Base) MCG/ACT inhaler Inhale 2 puffs into the lungs every 4 (four) hours as needed. 17 each 6   ALPRAZolam (XANAX) 0.25 MG tablet Take 0.25 mg by mouth daily as needed.     Budeson-Glycopyrrol-Formoterol (BREZTRI AEROSPHERE) 160-9-4.8 MCG/ACT AERO Inhale 2 puffs into the lungs in the morning and at bedtime. 10.7 g 11   CALCIUM PO Take 1 tablet by mouth daily.     Continuous Blood Gluc Sensor (FREESTYLE LIBRE 3 SENSOR) MISC  1 Device by Does not apply route every 14 (fourteen) days. 6 each 3   Continuous Glucose Receiver (FREESTYLE LIBRE 14 DAY READER) DEVI by Does not apply route.     Continuous Glucose Sensor (FREESTYLE LIBRE 2 SENSOR) MISC by Does not apply route.     donepezil (ARICEPT) 10 MG tablet Take 1 tablet (10 mg total) by mouth at bedtime. 30 tablet 11   dupilumab (DUPIXENT) 300 MG/2ML prefilled syringe Inject 300 mg into the skin every 14 (fourteen) days. 4 mL 11   famotidine (PEPCID) 20 MG tablet Take 1 tablet (20 mg total) by mouth daily. 30 tablet 5   gabapentin (NEURONTIN) 600 MG tablet Take 600 mg by mouth 3 (three) times daily.     HYDROcodone-acetaminophen (NORCO/VICODIN) 5-325 MG tablet Take 1 tablet by mouth every 6 (six) hours as needed.     hydrocortisone (CORTEF) 10 MG tablet Take 1 tablet (10 mg total) by mouth as directed. 1.5 tab QAM and half a tab between 2-4 pm 200 tablet 3   insulin isophane & regular human KwikPen (NOVOLIN 70/30 KWIKPEN) (70-30) 100 UNIT/ML KwikPen Inject 36 Units into the skin daily before breakfast AND 10 Units daily before supper. 45 mL 4   Insulin Pen Needle 32G X 4 MM MISC 1 Device by Does not apply route in the morning and at bedtime. 200 each 3   lubiprostone (AMITIZA) 8 MCG capsule Take 1 capsule (8 mcg total) by mouth 2 (two) times daily with a meal. 180 capsule 3   meloxicam (MOBIC) 7.5 MG tablet Take 7.5 mg by mouth 2 (two) times daily.     metFORMIN (GLUCOPHAGE-XR) 500 MG 24 hr tablet Take 1 tablet (500 mg total) by mouth 2 (two) times daily. 180 tablet 2   pantoprazole (PROTONIX) 40 MG tablet Take 40 mg by mouth daily.     Potassium 99 MG TABS Take 1 tablet by mouth as needed.     Probiotic Product (PROBIOTIC DAILY PO) Take by mouth. One daily     Semaglutide,0.25 or 0.5MG /DOS, 2 MG/3ML SOPN Inject 0.5 mg into the skin once a week. 3 mL 3   simvastatin (ZOCOR) 20 MG tablet Take 20 mg by mouth daily.     traZODone (DESYREL) 50 MG tablet Take 50 mg by mouth  at bedtime.     triamcinolone cream (KENALOG) 0.1 % SMARTSIG:sparingly Topical Twice Daily     venlafaxine XR (EFFEXOR-XR) 75 MG 24 hr capsule Take 75 mg by mouth daily.     VITAMIN D, CHOLECALCIFEROL, PO Take by mouth daily at 6 (six) AM.     Current Facility-Administered Medications  Medication Dose Route Frequency Provider Last Rate Last Admin   dupilumab (DUPIXENT) prefilled syringe 300 mg  300 mg Subcutaneous Q14 Days Ambs, Norvel Richards, FNP  300 mg at 11/06/21 1717    Allergies as of 08/01/2023 - Review Complete 08/01/2023  Allergen Reaction Noted   Atorvastatin  01/22/2017   Empagliflozin  07/06/2021   Penicillins Itching 12/13/2010   Sulfa antibiotics Itching 12/26/2010    Family History  Problem Relation Age of Onset   Heart attack Mother        deceased at age 39   Asthma Mother    Other Father        deceased at age 26   Diabetes Sister     Social History   Socioeconomic History   Marital status: Divorced    Spouse name: Not on file   Number of children: 2   Years of education: 12   Highest education level: Not on file  Occupational History   Occupation: RETIRED    Comment: 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   Caffeine 1 c daily   Social Drivers of Corporate investment banker Strain: Not on file  Food Insecurity: Not on file  Transportation Needs: Not on file  Physical  Activity: Not on file  Stress: Not on file  Social Connections: Not on file    Review of systems General: negative for malaise, night sweats, fever, chills, weight loss Neck: Negative for lumps, goiter, pain and significant neck swelling Resp: Negative for cough, wheezing, dyspnea at rest CV: Negative for chest pain, leg swelling, palpitations, orthopnea GI: denies melena, hematochezia, nausea, vomiting, diarrhea, dysphagia, odyonophagia, early satiety or unintentional weight loss.  + Left upper quadrant pain + constipation The remainder of the review of systems is noncontributory.  Physical Exam: There were no vitals taken for this visit. General:   Alert and oriented. No distress noted. Pleasant and cooperative.  Head:  Normocephalic and atraumatic. Eyes:  Conjuctiva clear without scleral icterus. Mouth:  Oral mucosa pink and moist. Good dentition. No lesions. Heart: Normal rate and rhythm, s1 and s2 heart sounds present.  Lungs: Clear lung sounds in all lobes. Respirations equal and unlabored. Abdomen:  +BS, soft, and non-distended.  Mild tenderness to upper quadrant.  No rebound or guarding. No HSM or masses noted. Neurologic:  Alert and  oriented x4 Psych:  Alert and cooperative. Normal mood and affect.  Invalid input(s): "6 MONTHS"   ASSESSMENT: Emily Humphrey is a 73 y.o. female presenting today for follow-up of left upper quadrant pain and IBS and schedule colonoscopy  IBS: Constipation was well-managed on Amitiza 8 mcg twice daily though she reports she recently ran out and was able to afford refilling this, now having sporadic bowel movements.  She felt that she was doing well on Amitiza and would like to continue on this.  She has had a change in insurance and is hoping that  she could now get this at a reasonable cost.  I instructed patient to let me know if she is unable to afford Amitiza and recent call so we can try to switch her to another alternative for  constipation.  History of tubular adenoma colon: Last colonoscopy in 2022 with 4 tubular adenomas, recommended to have repeat colonoscopy in 3 years.  She is amenable to scheduling colonoscopy.  Left upper quadrant pain: Patient has had some intermittent, ongoing left upper quadrant pain though seems less frequent than at last visit.  She denies any associated nausea, vomiting, early satiety, weight loss, melena.  We did discuss at last visit potentially performing EGD for further evaluation of her symptoms at time of colonoscopy if pain persisted which I would recommend doing at this time as I cannot rule out gastritis, PUD, duodenitis.  Patient is agreeable to proceed with upper endoscopy at time of colonoscopy  Indications, risks and benefits of procedure discussed in detail with patient. Patient verbalized understanding and is in agreement to proceed with EGD/colon.    PLAN:  - continue protonix 40mg  and famotidine -resume amitiza 8 mcg twice daily if able, patient to let me know if she is unable to obtain this for reasonable cost -Good reflux precautions -schedule colonoscopy and EGD-ASA 2 -Increase water intake, aim for atleast 64 oz per day -Increase fruits, veggies and whole grains, kiwi and prunes are especially good for constipation  All questions were answered, patient verbalized understanding and is in agreement with plan as outlined above.   Follow Up: 6 months  Jymir Dunaj L. Jeanmarie Hubert, MSN, APRN, AGNP-C Adult-Gerontology Nurse Practitioner [facility]

## 2023-08-05 ENCOUNTER — Other Ambulatory Visit: Payer: Self-pay

## 2023-08-05 MED ORDER — FREESTYLE LIBRE 3 SENSOR MISC
3 refills | Status: AC
Start: 2023-08-05 — End: ?

## 2023-08-05 MED ORDER — FREESTYLE LIBRE 3 SENSOR MISC: 3 refills | Status: DC

## 2023-08-08 ENCOUNTER — Other Ambulatory Visit (HOSPITAL_COMMUNITY): Payer: Self-pay

## 2023-08-13 ENCOUNTER — Encounter (INDEPENDENT_AMBULATORY_CARE_PROVIDER_SITE_OTHER): Payer: Self-pay | Admitting: *Deleted

## 2023-08-13 ENCOUNTER — Encounter (HOSPITAL_COMMUNITY)
Admission: RE | Disposition: A | Discharge status: Home / Self Care | Payer: Self-pay | Source: Ambulatory Visit | Attending: Gastroenterology

## 2023-08-13 ENCOUNTER — Other Ambulatory Visit: Payer: Self-pay

## 2023-08-13 ENCOUNTER — Encounter (HOSPITAL_COMMUNITY): Payer: Self-pay | Admitting: Gastroenterology

## 2023-08-13 ENCOUNTER — Ambulatory Visit (HOSPITAL_COMMUNITY): Payer: HMO | Admitting: Certified Registered"

## 2023-08-13 ENCOUNTER — Ambulatory Visit (HOSPITAL_COMMUNITY)
Admission: RE | Admit: 2023-08-13 | Discharge: 2023-08-13 | Disposition: A | Discharge status: Home / Self Care | Payer: HMO | Source: Ambulatory Visit | Attending: Gastroenterology | Admitting: Gastroenterology

## 2023-08-13 DIAGNOSIS — Z7984 Long term (current) use of oral hypoglycemic drugs: Secondary | ICD-10-CM | POA: Insufficient documentation

## 2023-08-13 DIAGNOSIS — G4733 Obstructive sleep apnea (adult) (pediatric): Secondary | ICD-10-CM | POA: Insufficient documentation

## 2023-08-13 DIAGNOSIS — D123 Benign neoplasm of transverse colon: Secondary | ICD-10-CM | POA: Diagnosis not present

## 2023-08-13 DIAGNOSIS — Z79899 Other long term (current) drug therapy: Secondary | ICD-10-CM | POA: Insufficient documentation

## 2023-08-13 DIAGNOSIS — K2970 Gastritis, unspecified, without bleeding: Secondary | ICD-10-CM

## 2023-08-13 DIAGNOSIS — E785 Hyperlipidemia, unspecified: Secondary | ICD-10-CM | POA: Diagnosis not present

## 2023-08-13 DIAGNOSIS — K5730 Diverticulosis of large intestine without perforation or abscess without bleeding: Secondary | ICD-10-CM

## 2023-08-13 DIAGNOSIS — E119 Type 2 diabetes mellitus without complications: Secondary | ICD-10-CM | POA: Insufficient documentation

## 2023-08-13 DIAGNOSIS — J4489 Other specified chronic obstructive pulmonary disease: Secondary | ICD-10-CM | POA: Diagnosis not present

## 2023-08-13 DIAGNOSIS — R1012 Left upper quadrant pain: Secondary | ICD-10-CM | POA: Insufficient documentation

## 2023-08-13 DIAGNOSIS — K219 Gastro-esophageal reflux disease without esophagitis: Secondary | ICD-10-CM | POA: Insufficient documentation

## 2023-08-13 DIAGNOSIS — Z7985 Long-term (current) use of injectable non-insulin antidiabetic drugs: Secondary | ICD-10-CM | POA: Insufficient documentation

## 2023-08-13 DIAGNOSIS — J449 Chronic obstructive pulmonary disease, unspecified: Secondary | ICD-10-CM | POA: Diagnosis not present

## 2023-08-13 DIAGNOSIS — I10 Essential (primary) hypertension: Secondary | ICD-10-CM

## 2023-08-13 DIAGNOSIS — D122 Benign neoplasm of ascending colon: Secondary | ICD-10-CM

## 2023-08-13 DIAGNOSIS — Z1211 Encounter for screening for malignant neoplasm of colon: Secondary | ICD-10-CM | POA: Diagnosis not present

## 2023-08-13 DIAGNOSIS — K589 Irritable bowel syndrome without diarrhea: Secondary | ICD-10-CM | POA: Insufficient documentation

## 2023-08-13 DIAGNOSIS — R1013 Epigastric pain: Secondary | ICD-10-CM | POA: Insufficient documentation

## 2023-08-13 DIAGNOSIS — K644 Residual hemorrhoidal skin tags: Secondary | ICD-10-CM | POA: Diagnosis not present

## 2023-08-13 DIAGNOSIS — Z794 Long term (current) use of insulin: Secondary | ICD-10-CM | POA: Insufficient documentation

## 2023-08-13 DIAGNOSIS — D120 Benign neoplasm of cecum: Secondary | ICD-10-CM | POA: Diagnosis not present

## 2023-08-13 DIAGNOSIS — K635 Polyp of colon: Secondary | ICD-10-CM | POA: Diagnosis not present

## 2023-08-13 DIAGNOSIS — K648 Other hemorrhoids: Secondary | ICD-10-CM | POA: Diagnosis not present

## 2023-08-13 DIAGNOSIS — Z87891 Personal history of nicotine dependence: Secondary | ICD-10-CM | POA: Diagnosis not present

## 2023-08-13 HISTORY — PX: ESOPHAGOGASTRODUODENOSCOPY (EGD) WITH PROPOFOL: SHX5813

## 2023-08-13 HISTORY — PX: POLYPECTOMY: SHX5525

## 2023-08-13 HISTORY — PX: BIOPSY: SHX5522

## 2023-08-13 HISTORY — PX: COLONOSCOPY WITH PROPOFOL: SHX5780

## 2023-08-13 LAB — GLUCOSE, CAPILLARY: Glucose-Capillary: 114 mg/dL — ABNORMAL HIGH (ref 70–99)

## 2023-08-13 LAB — HM COLONOSCOPY

## 2023-08-13 SURGERY — ESOPHAGOGASTRODUODENOSCOPY (EGD) WITH PROPOFOL
Anesthesia: General

## 2023-08-13 MED ORDER — LIDOCAINE HCL (CARDIAC) PF 100 MG/5ML IV SOSY
PREFILLED_SYRINGE | INTRAVENOUS | Status: DC | PRN
  Administered 2023-08-13: 40 mg via INTRATRACHEAL
  Administered 2023-08-13: 60 mg via INTRATRACHEAL

## 2023-08-13 MED ORDER — SODIUM CHLORIDE 0.9% FLUSH: 3.0000 mL | INTRAVENOUS | Status: DC | PRN

## 2023-08-13 MED ORDER — SODIUM CHLORIDE 0.9% FLUSH: 3.0000 mL | Freq: Two times a day (BID) | INTRAVENOUS | Status: DC

## 2023-08-13 MED ORDER — GLYCOPYRROLATE PF 0.2 MG/ML IJ SOSY
PREFILLED_SYRINGE | INTRAMUSCULAR | Status: AC
  Filled 2023-08-13: qty 1

## 2023-08-13 MED ORDER — LACTATED RINGERS IV SOLN: INTRAVENOUS | Status: DC | PRN

## 2023-08-13 MED ORDER — STERILE WATER FOR IRRIGATION IR SOLN
Status: DC | PRN
  Administered 2023-08-13: 60 mL

## 2023-08-13 MED ORDER — PROPOFOL 10 MG/ML IV BOLUS
INTRAVENOUS | Status: DC | PRN
  Administered 2023-08-13: 30 mg via INTRAVENOUS
  Administered 2023-08-13: 70 mg via INTRAVENOUS
  Administered 2023-08-13 (×2): 30 mg via INTRAVENOUS

## 2023-08-13 MED ORDER — LIDOCAINE HCL (PF) 2 % IJ SOLN
INTRAMUSCULAR | Status: AC
  Filled 2023-08-13: qty 5

## 2023-08-13 MED ORDER — PROPOFOL 500 MG/50ML IV EMUL
INTRAVENOUS | Status: DC | PRN
  Administered 2023-08-13: 125 ug/kg/min via INTRAVENOUS

## 2023-08-13 MED ORDER — PROPOFOL 10 MG/ML IV BOLUS
INTRAVENOUS | Status: AC
  Filled 2023-08-13: qty 20

## 2023-08-13 MED ORDER — GLYCOPYRROLATE PF 0.2 MG/ML IJ SOSY
PREFILLED_SYRINGE | INTRAMUSCULAR | Status: DC | PRN
  Administered 2023-08-13 (×2): .1 mg via INTRAVENOUS

## 2023-08-13 MED ORDER — PHENYLEPHRINE 80 MCG/ML (10ML) SYRINGE FOR IV PUSH (FOR BLOOD PRESSURE SUPPORT)
PREFILLED_SYRINGE | INTRAVENOUS | Status: DC | PRN
  Administered 2023-08-13 (×2): 80 ug via INTRAVENOUS

## 2023-08-13 NOTE — Interval H&P Note (Signed)
 History and Physical Interval Note:  08/13/2023 8:43 AM  Emily Humphrey  has presented today for surgery, with the diagnosis of LUQ PAIN, GERD, HISTORY TUBULAR ADENOMAS.  The various methods of treatment have been discussed with the patient and family. After consideration of risks, benefits and other options for treatment, the patient has consented to  Procedure(s) with comments: ESOPHAGOGASTRODUODENOSCOPY (EGD) WITH PROPOFOL (N/A) - 9:00AM;ASA 2 COLONOSCOPY WITH PROPOFOL (N/A) - 9:00AM;ASA 2 as a surgical intervention.  The patient's history has been reviewed, patient examined, no change in status, stable for surgery.  I have reviewed the patient's chart and labs.  Questions were answered to the patient's satisfaction.     [name] Hanna Aultman

## 2023-08-13 NOTE — Anesthesia Postprocedure Evaluation (Signed)
 Anesthesia Post Note  Patient: Emily Humphrey  Procedure(s) Performed: ESOPHAGOGASTRODUODENOSCOPY (EGD) WITH PROPOFOL COLONOSCOPY WITH PROPOFOL BIOPSY POLYPECTOMY  Patient location during evaluation: PACU Anesthesia Type: General Level of consciousness: awake and alert Pain management: pain level controlled Vital Signs Assessment: post-procedure vital signs reviewed and stable Respiratory status: spontaneous breathing, nonlabored ventilation, respiratory function stable and patient connected to nasal cannula oxygen Cardiovascular status: blood pressure returned to baseline and stable Postop Assessment: no apparent nausea or vomiting Anesthetic complications: no   There were no known notable events for this encounter.   Last Vitals:  Vitals:   08/13/23 0831 08/13/23 0940  BP: 132/70 (!) 100/57  Pulse: 100 (!) 110  Resp: (!) 21 20  Temp: 36.8 C 36.5 C  SpO2: 97% 97%    Last Pain:  Vitals:   08/13/23 0940  TempSrc: Oral  PainSc: 0-No pain                 Emily Humphrey

## 2023-08-13 NOTE — Op Note (Signed)
 [facility] Patient Name: Emily Humphrey Procedure Date: 08/13/2023 8:45 AM MRN: [id number] Date of Birth: 01/16/1951 Attending MD: Sanjuan Dame , MD, [id number] CSN: [id number] Age: 73 Admit Type: Outpatient Procedure:                Colonoscopy Indications:              High risk colon cancer surveillance: Personal                            history of colonic polyps Providers:                Sanjuan Dame, MD, Francoise Ceo RN, RN, Elinor Parkinson Referring MD:              Medicines:                Monitored Anesthesia Care Complications:            No immediate complications. Estimated Blood Loss:     Estimated blood loss: none. Procedure:                Pre-Anesthesia Assessment:                           - Prior to the procedure, a History and Physical                            was performed, and patient medications and                            allergies were reviewed. The patient's tolerance of                            previous anesthesia was also reviewed. The risks                            and benefits of the procedure and the sedation                            options and risks were discussed with the patient.                            All questions were answered, and informed consent                            was obtained. Prior Anticoagulants: The patient has                            taken no anticoagulant or antiplatelet agents                            except for NSAID medication. ASA Grade Assessment:                            II -  A patient with mild systemic disease. After                            reviewing the risks and benefits, the patient was                            deemed in satisfactory condition to undergo the                            procedure.                           After obtaining informed consent, the colonoscope                            was passed under direct vision. Throughout the                             procedure, the patient's blood pressure, pulse, and                            oxygen saturations were monitored continuously. The                            [id number]) scope was introduced through                            the anus and advanced to the the terminal ileum.                            The colonoscopy was performed without difficulty.                            The patient tolerated the procedure well. The                            quality of the bowel preparation was evaluated                            using the BBPS [facility] Bowel Preparation Scale)                            with scores of: Right Colon = 3, Transverse Colon =                            3 and Left Colon = 3 (entire mucosa seen well with                            no residual staining, small fragments of stool or                            opaque liquid). The total BBPS score equals 9. The  terminal ileum, ileocecal valve, appendiceal                            orifice, and rectum were photographed. Scope In: 9:12:42 AM Scope Out: 9:34:54 AM Scope Withdrawal Time: 0 hours 19 minutes 48 seconds  Total Procedure Duration: 0 hours 22 minutes 12 seconds  Findings:      The perianal and digital rectal examinations were normal.      A 5 mm polyp was found in the ileocecal valve. The polyp was sessile.       The polyp was removed with a cold snare. Resection and retrieval were       complete.      Two sessile polyps were found in the transverse colon and ascending       colon. The polyps were 3 to 5 mm in size. These polyps were removed with       a cold snare. Resection and retrieval were complete.      Scattered medium-mouthed diverticula were found in the left colon.      Non-bleeding external and internal hemorrhoids were found during       retroflexion. The hemorrhoids were small.      The terminal ileum appeared normal. Impression:               - One 5 mm  polyp at the ileocecal valve, removed                            with a cold snare. Resected and retrieved.                           - Two 3 to 5 mm polyps in the transverse colon and                            in the ascending colon, removed with a cold snare.                            Resected and retrieved.                           - Diverticulosis in the left colon.                           - Non-bleeding external and internal hemorrhoids.                           - The examined portion of the ileum was normal. Moderate Sedation:      Per Anesthesia Care Recommendation:           - Patient has a contact number available for                            emergencies. The signs and symptoms of potential                            delayed complications were discussed with the  patient. Return to normal activities tomorrow.                            Written discharge instructions were provided to the                            patient.                           - Resume previous diet.                           - Continue present medications.                           - Await pathology results.                           - Repeat colonoscopy in 5 years for surveillance.                           - Return to GI office as previously scheduled. Procedure Code(s):        --- Professional ---                           [phone], Colonoscopy, flexible; with removal of                            tumor(s), polyp(s), or other lesion(s) by snare                            technique Diagnosis Code(s):        --- Professional ---                           Z86.010, Personal history of colonic polyps                           D12.0, Benign neoplasm of cecum                           D12.3, Benign neoplasm of transverse colon (hepatic                            flexure or splenic flexure)                           D12.2, Benign neoplasm of ascending colon                            K64.8, Other hemorrhoids                           K57.30, Diverticulosis of large intestine without                            perforation or abscess without bleeding CPT copyright 2022  American Medical Association. All rights reserved. The codes documented in this report are preliminary and upon coder review may  be revised to meet current compliance requirements. Sanjuan Dame, MD Sanjuan Dame, MD 08/13/2023 9:43:06 AM This report has been signed electronically. Number of Addenda: 0

## 2023-08-13 NOTE — Discharge Instructions (Addendum)

## 2023-08-13 NOTE — Anesthesia Preprocedure Evaluation (Addendum)
 Anesthesia Evaluation  Patient identified by MRN, date of birth, ID band Patient awake    Reviewed: Allergy & Precautions, H&P , NPO status , Patient's Chart, lab work & pertinent test results, reviewed documented beta blocker date and time   Airway Mallampati: II  TM Distance: >3 FB Neck ROM: full    Dental no notable dental hx. (+) Dental Advisory Given,    Pulmonary asthma , sleep apnea , pneumonia, resolved, COPD, former smoker   Pulmonary exam normal breath sounds clear to auscultation       Cardiovascular Exercise Tolerance: Good hypertension, + DOE   Rhythm:regular Rate:Normal  EF 55% with grade 1 diastolic dysfunction   Neuro/Psych  Neuromuscular disease  negative psych ROS   GI/Hepatic Neg liver ROS,GERD  ,,  Endo/Other  diabetes    Renal/GU negative Renal ROS  negative genitourinary   Musculoskeletal   Abdominal   Peds  Hematology negative hematology ROS (+)   Anesthesia Other Findings   Reproductive/Obstetrics negative OB ROS                             Anesthesia Physical Anesthesia Plan  ASA: 3  Anesthesia Plan: General   Post-op Pain Management: Minimal or no pain anticipated   Induction: Intravenous  PONV Risk Score and Plan: Propofol infusion  Airway Management Planned: Nasal Cannula and Natural Airway  Additional Equipment: None  Intra-op Plan:   Post-operative Plan:   Informed Consent: I have reviewed the patients History and Physical, chart, labs and discussed the procedure including the risks, benefits and alternatives for the proposed anesthesia with the patient or authorized representative who has indicated his/her understanding and acceptance.       Plan Discussed with: CRNA  Anesthesia Plan Comments:         Anesthesia Quick Evaluation

## 2023-08-13 NOTE — Op Note (Signed)
 [facility] Patient Name: Emily Humphrey Procedure Date: 08/13/2023 8:48 AM MRN: [id number] Date of Birth: [birth year] Attending MD: Sanjuan Dame , MD, [id number] CSN: [id number] Age: 72 Admit Type: Outpatient Procedure:                Upper GI endoscopy Indications:              Abdominal pain in the left upper quadrant, Dyspepsia Providers:                Sanjuan Dame, MD, Francoise Ceo RN, RN, Elinor Parkinson Referring MD:              Medicines:                Monitored Anesthesia Care Complications:            No immediate complications. Estimated Blood Loss:     Estimated blood loss was minimal. Procedure:                Pre-Anesthesia Assessment:                           - Prior to the procedure, a History and Physical                            was performed, and patient medications and                            allergies were reviewed. The patient's tolerance of                            previous anesthesia was also reviewed. The risks                            and benefits of the procedure and the sedation                            options and risks were discussed with the patient.                            All questions were answered, and informed consent                            was obtained. Prior Anticoagulants: The patient has                            taken no anticoagulant or antiplatelet agents. ASA                            Grade Assessment: II - A patient with mild systemic                            disease. After reviewing the risks and benefits,  the patient was deemed in satisfactory condition to                            undergo the procedure.                           After obtaining informed consent, the endoscope was                            passed under direct vision. Throughout the                            procedure, the patient's blood pressure, pulse, and                             oxygen saturations were monitored continuously. The                            [id number]-[id number] ([id number]) scope was introduced through the                            mouth, and advanced to the second part of duodenum.                            The upper GI endoscopy was accomplished without                            difficulty. The patient tolerated the procedure                            well. Scope In: 9:01:13 AM Scope Out: 9:06:35 AM Total Procedure Duration: 0 hours 5 minutes 22 seconds  Findings:      The examined esophagus was normal.      Minimal inflammation characterized by nodularity was found in the       stomach. Biopsies were taken with a cold forceps for histology.      The duodenal bulb and second portion of the duodenum were normal.       Biopsies for histology were taken with a cold forceps for evaluation of       celiac disease. Impression:               - Normal esophagus.                           - Gastritis. Biopsied.                           - Normal duodenal bulb and second portion of the                            duodenum. Biopsied. Moderate Sedation:      Per Anesthesia Care Recommendation:           - Patient has a contact number available for  emergencies. The signs and symptoms of potential                            delayed complications were discussed with the                            patient. Return to normal activities tomorrow.                            Written discharge instructions were provided to the                            patient.                           - Resume previous diet.                           - Continue present medications.                           - Await pathology results.                           -Follow up in GI clinic and consider obatining CT                            Abdomen pelvis given persistent pain Procedure Code(s):        --- Professional ---                           [phone],  Esophagogastroduodenoscopy, flexible,                            transoral; with biopsy, single or multiple Diagnosis Code(s):        --- Professional ---                           K29.70, Gastritis, unspecified, without bleeding                           R10.12, Left upper quadrant pain                           R10.13, Epigastric pain CPT copyright 2022 American Medical Association. All rights reserved. The codes documented in this report are preliminary and upon coder review may  be revised to meet current compliance requirements. Sanjuan Dame, MD Sanjuan Dame, MD 08/13/2023 9:39:50 AM This report has been signed electronically. Number of Addenda: 0

## 2023-08-13 NOTE — Transfer of Care (Addendum)
 Immediate Anesthesia Transfer of Care Note  Patient: Emily Humphrey  Procedure(s) Performed: ESOPHAGOGASTRODUODENOSCOPY (EGD) WITH PROPOFOL COLONOSCOPY WITH PROPOFOL BIOPSY POLYPECTOMY  Patient Location: Endoscopy Unit  Anesthesia Type:General  Level of Consciousness: drowsy and patient cooperative  Airway & Oxygen Therapy: Patient Spontanous Breathing  Post-op Assessment: Report given to RN and Post -op Vital signs reviewed and stable  Post vital signs: Reviewed and stable  Last Vitals:  Vitals Value Taken Time  BP 100/57 08/13/23   0940  Temp 36.5 08/13/23   0940  Pulse 110 08/13/23   0940  Resp 20 08/13/23   0940  SpO2 97% 08/13/23   0940    Last Pain:  Vitals:   08/13/23 0854  TempSrc:   PainSc: 5       Patients Stated Pain Goal: 5 (08/13/23 0831)  Complications: No notable events documented.

## 2023-08-14 LAB — SURGICAL PATHOLOGY

## 2023-08-15 ENCOUNTER — Encounter (HOSPITAL_COMMUNITY): Payer: Self-pay | Admitting: Gastroenterology

## 2023-08-16 NOTE — Progress Notes (Signed)
 I reviewed the pathology results. Ann, can you send her a letter with the findings as described below please?  Repeat colonoscopy in 5 years  Thanks,  Vista Lawman, MD Gastroenterology and Hepatology [facility]  ---------------------------------------------------------------------------------------------  [facility] [address], [address], [place], [place] [address] Phone:  [phone]   08/16/23 [place], [place]   Dear Marden Noble,  I am writing to let you know the results of your recent colonoscopy.  You had a total of 3 polyps removed. The pathology came back as "tubular adenoma." These findings are NOT cancer, but had the polyps remained in your colon, they could have turned into cancer.  Given these findings, it is recommended that your next colonoscopy be performed in 5 years.  As per [place]  Multi-Society Task Force on Colorectal Cancer recommendation; For individuals ages 32 to 19, the decision to start or continue screening should be individualized.  Also your upper endoscopy demonstrates:  No H. Pylori bacteria in stomach , or any early cancer changes to the stomach mucosa ( Intestinal metaplasia)   Normal biopsies of the small bowel  Follow up in GI clinic and will consider obtaining CT Abdomen pelvis if you continue to have symptoms pain  Also I value your feedback , so if you get a survey , please take the time to fill it out and thank you for choosing [facility]  Please call us at [phone] if you have persistent problems or have questions about your condition that have not been fully answered at this time.  Sincerely,  Vista Lawman, MD Gastroenterology and Hepatology

## 2023-08-20 ENCOUNTER — Telehealth (INDEPENDENT_AMBULATORY_CARE_PROVIDER_SITE_OTHER): Payer: Self-pay | Admitting: Gastroenterology

## 2023-08-20 ENCOUNTER — Ambulatory Visit (INDEPENDENT_AMBULATORY_CARE_PROVIDER_SITE_OTHER): Admitting: Gastroenterology

## 2023-08-20 VITALS — BP 113/74 | HR 103 | Temp 97.7°F | Ht 62.0 in | Wt 151.0 lb

## 2023-08-20 DIAGNOSIS — K219 Gastro-esophageal reflux disease without esophagitis: Secondary | ICD-10-CM

## 2023-08-20 DIAGNOSIS — R1012 Left upper quadrant pain: Secondary | ICD-10-CM

## 2023-08-20 DIAGNOSIS — K5900 Constipation, unspecified: Secondary | ICD-10-CM | POA: Diagnosis not present

## 2023-08-20 DIAGNOSIS — K581 Irritable bowel syndrome with constipation: Secondary | ICD-10-CM

## 2023-08-20 DIAGNOSIS — R1010 Upper abdominal pain, unspecified: Secondary | ICD-10-CM | POA: Diagnosis not present

## 2023-08-20 MED ORDER — OMEPRAZOLE 40 MG PO CPDR: 40.0000 mg | DELAYED_RELEASE_CAPSULE | Freq: Every day | ORAL | 1 refills | Status: DC

## 2023-08-20 NOTE — Telephone Encounter (Signed)
 Left message to return call. Pt CT is scheduled for 09/10/23 at 12:30pm with an arrival time of 12:15pm. Will mail reminder to patient.

## 2023-08-20 NOTE — Patient Instructions (Signed)
-  we will Schedule CT scan of your abdomen  -stop pantoprazole -start omeprazole 40mg  daily -can continue with famotidine as needed in the evenings  -continue amitiza twice daily for constipation  -Increase water intake, aim for atleast 64 oz per day -Increase fruits, veggies and whole grains, kiwi and prunes are especially good for constipation -be mindful of greasy, spicy, tomato/citrus based foods, caffeine, chocolate and alcohol. Make sure to stay upright 2-3 hours after eating prior to laying down and avoid eating late  Follow up 3 months  It was a pleasure to see you today. I want to create trusting relationships with patients and provide genuine, compassionate, and quality care. I truly value your feedback! please be on the lookout for a survey regarding your visit with me today. I appreciate your input about our visit and your time in completing this!    Emily Creighton L. Jeanmarie Hubert, MSN, APRN, AGNP-C Adult-Gerontology Nurse Practitioner [facility]

## 2023-08-20 NOTE — Telephone Encounter (Signed)
 Pt left voicemail returning call.  Returned call to pt and pt verbalized understanding.

## 2023-08-20 NOTE — Progress Notes (Signed)
 Referring Provider: Lianne Moris, PA-C Primary Care Physician:  Lianne Moris, PA-C Primary GI Physician: Dr. Tasia Catchings   Chief Complaint  Patient presents with   post procedure follow up    Post procedure follow up. Wanted to get CT scan scheduled for abdominal pain she is having.    HPI:   Emily Humphrey is a 73 y.o. female with past medical history of asthma, COPD, dyslipidemia, GERD, DM, OSA, IBS   Patient presenting today for follow up of: Abdominal pain  GERD constipation  Last seen February 2025, at that time having LUQ Pain/tenderness. Doing amitiza BID  Recommended to continue protonix and famotidine, resume amitiza, schedule Colonoscopy and EGD, diet high in fruits, veggies, whole grains, water intake   Present: Patient reports she has ongoing upper abdominal pain. She notes she was told after procedures she should have CT of her abdomen if symptoms persisted. She feels that pain is becoming worse, more frequent and hurting a little more severely. She though initially it was brought on by something she was eating but has cleaned up her diet and had no improvement. She notes that pain wakes her up often in the mornings. Feels coffee sometimes irritates things so has been avoiding this. She has had some weight loss but is on ozempic. Eating itself does not worsen her abdominal pain.   She is taking her pantoprazole 40mg  in the morning and famotidine 20mg  at bedtime. She is having some reflux symptoms. She has GERD symptoms atleast 2 times per week. She has been maintained on pantoprazole for many years.   She has questions about presence of diverticulosis and gastritis on recent Endoscopic evaluations.   She is doing amitiza BID and having a BM usually every other day to every day.   CT A/P w/contrast in November Colonic diverticulosis, without radiographic evidence of  diverticulitis or other acute findings.  Last Colonoscopy:2/2025One 5 mm polyp at the ileocecal  valve, removed                            with a cold snare. Resected and retrieved.                           - Two 3 to 5 mm polyps in the transverse colon and                            in the ascending colon, removed with a cold snare.                            Resected and retrieved.                           - Diverticulosis in the left colon.                           - Non-bleeding external and internal hemorrhoids.                           - The examined portion of the ileum was normal. Last EGD: 07/2023- Normal esophagus.                           -  Gastritis. Biopsied.                           - Normal duodenal bulb and second portion of the                            duodenum. Biopsied.  SMALL BOWEL, BIOPSY:  Duodenal mucosa with normal villous architecture.  No villous atrophy or increased intraepithelial lymphocytes.   B. STOMACH, BIOPSY:  Unremarkable gastric antral and oxyntic mucosa.  Negative for Helicobacter pylori.   C. COLON, ILEOCECAL VALVE, ASCENDING, TREANSVERSE, POLYPECTOMY:  Tubular adenoma (s) without high grade dysplasia.    Recommendations:  Repeat TCS in 5 years  Filed Weights   08/20/23 1224  Weight: 151 lb (68.5 kg)     Past Medical History:  Diagnosis Date   Abnormal gait    Asthma    Chest pain    Hospital, May, 2012,  Pericarditis   COPD (chronic obstructive pulmonary disease) (HCC)    Chronic steroid use   Dyslipidemia    Ejection fraction    Normal, echo, UXN,2355   GERD (gastroesophageal reflux disease)    History of tobacco abuse    IDDM (insulin dependent diabetes mellitus)    Morbid obesity (HCC)    OSA (obstructive sleep apnea)    mild/not using C-PAP    Pericardial effusion    Small, echo, circumferential, May, 2012   Pericarditis    Hospitalization, May, 2012   Pneumonia 2010   Tremor     Past Surgical History:  Procedure Laterality Date   ABDOMINAL HYSTERECTOMY     BIOPSY  08/13/2023   Procedure: BIOPSY;   Surgeon: Franky Macho, MD;  [facility]: [facility];  Service: Endoscopy;;   COLONOSCOPY WITH PROPOFOL N/A 08/13/2023   Procedure: COLONOSCOPY WITH PROPOFOL;  Surgeon: Franky Macho, MD;  Location: [facility];  Service: Endoscopy;  Laterality: N/A;  9:00AM;ASA 2   ESOPHAGOGASTRODUODENOSCOPY (EGD) WITH PROPOFOL N/A 08/13/2023   Procedure: ESOPHAGOGASTRODUODENOSCOPY (EGD) WITH PROPOFOL;  Surgeon: Franky Macho, MD;  Location: [facility];  Service: Endoscopy;  Laterality: N/A;  9:00AM;ASA 2   NASAL SINUS SURGERY     POLYPECTOMY  08/13/2023   Procedure: POLYPECTOMY;  Surgeon: Franky Macho, MD;  Location: [facility];  Service: Endoscopy;;   Removal of throat nodules     vocal cored nodules    Current Outpatient Medications  Medication Sig Dispense Refill   ACCU-CHEK AVIVA PLUS test strip      albuterol (VENTOLIN HFA) 108 (90 Base) MCG/ACT inhaler Inhale 2 puffs into the lungs every 4 (four) hours as needed. 17 each 6   ALPRAZolam (XANAX) 0.25 MG tablet Take 0.25 mg by mouth daily as needed.     Budeson-Glycopyrrol-Formoterol (BREZTRI AEROSPHERE) 160-9-4.8 MCG/ACT AERO Inhale 2 puffs into the lungs in the morning and at bedtime. 10.7 g 11   CALCIUM PO Take 1 tablet by mouth daily.     Continuous Glucose Receiver (FREESTYLE LIBRE 14 DAY READER) DEVI by Does not apply route.     Continuous Glucose Sensor (FREESTYLE LIBRE 2 SENSOR) MISC by Does not apply route.     Continuous Glucose Sensor (FREESTYLE LIBRE 3 SENSOR) MISC Change sensor every 14 days 6 each 3   donepezil (ARICEPT) 10 MG tablet Take 1 tablet (10 mg total) by mouth at bedtime. 30 tablet 11   dupilumab (DUPIXENT) 300 MG/2ML prefilled  syringe Inject 300 mg into the skin every 14 (fourteen) days. 4 mL 11   famotidine (PEPCID) 20 MG tablet Take 1 tablet (20 mg total) by mouth daily. 30 tablet 5   gabapentin (NEURONTIN) 600 MG tablet Take 600 mg by mouth 3 (three) times daily.     HYDROcodone-acetaminophen  (NORCO/VICODIN) 5-325 MG tablet Take 1 tablet by mouth every 6 (six) hours as needed.     hydrocortisone (CORTEF) 10 MG tablet Take 1 tablet (10 mg total) by mouth as directed. 1.5 tab QAM and half a tab between 2-4 pm 200 tablet 3   insulin isophane & regular human KwikPen (NOVOLIN 70/30 KWIKPEN) (70-30) 100 UNIT/ML KwikPen Inject 36 Units into the skin daily before breakfast AND 10 Units daily before supper. 45 mL 4   Insulin Pen Needle 32G X 4 MM MISC 1 Device by Does not apply route in the morning and at bedtime. 200 each 3   lubiprostone (AMITIZA) 8 MCG capsule Take 1 capsule (8 mcg total) by mouth 2 (two) times daily with a meal. 180 capsule 3   meloxicam (MOBIC) 7.5 MG tablet Take 7.5 mg by mouth 2 (two) times daily.     metFORMIN (GLUCOPHAGE-XR) 500 MG 24 hr tablet Take 1 tablet (500 mg total) by mouth 2 (two) times daily. 180 tablet 2   pantoprazole (PROTONIX) 40 MG tablet Take 40 mg by mouth daily.     Potassium 99 MG TABS Take 1 tablet by mouth as needed.     Probiotic Product (PROBIOTIC DAILY PO) Take by mouth. One daily     Semaglutide,0.25 or 0.5MG /DOS, 2 MG/3ML SOPN Inject 0.5 mg into the skin once a week. 3 mL 3   simvastatin (ZOCOR) 20 MG tablet Take 20 mg by mouth daily.     traZODone (DESYREL) 50 MG tablet Take 50 mg by mouth at bedtime.     triamcinolone cream (KENALOG) 0.1 % SMARTSIG:sparingly Topical Twice Daily     venlafaxine XR (EFFEXOR-XR) 75 MG 24 hr capsule Take 75 mg by mouth daily.     VITAMIN D, CHOLECALCIFEROL, PO Take by mouth daily at 6 (six) AM.     Current Facility-Administered Medications  Medication Dose Route Frequency Provider Last Rate Last Admin   dupilumab (DUPIXENT) prefilled syringe 300 mg  300 mg Subcutaneous Q14 Days Hetty Blend, FNP   300 mg at 11/06/21 1717    Allergies as of 08/20/2023 - Review Complete 08/20/2023  Allergen Reaction Noted   Atorvastatin  01/22/2017   Empagliflozin  07/06/2021   Penicillins Itching 12/13/2010   Sulfa  antibiotics Itching 12/26/2010    Social History   Socioeconomic History   Marital status: Divorced    Spouse name: Not on file   Number of children: 2   Years of education: 12   Highest education level: Not on file  Occupational History   Occupation: RETIRED    Comment: 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   Caffeine 1 c daily   Social Drivers of Corporate investment banker Strain: Not on file  Food Insecurity: Not on file  Transportation Needs: Not on file  Physical Activity: Not on file  Stress: Not on file  Social Connections: Not on file    Review of systems General: negative for malaise, night sweats, fever, chills,+weight loss Neck: Negative for lumps, goiter, pain and significant neck swelling Resp: Negative for cough, wheezing, dyspnea at rest CV: Negative for chest pain, leg swelling, palpitations, orthopnea GI: denies melena, hematochezia, nausea, vomiting, diarrhea, constipation, dysphagia, odyonophagia, early satiety or unintentional weight loss. +LUQ pain +GERD symptoms  The remainder of the review of systems is noncontributory.  Physical Exam: BP 113/74 (BP Location: Left Arm, Patient Position: Sitting)   Pulse (!) 103   Temp 97.7 F (36.5 C)   Ht 5\' 2"  (1.575 m)   Wt 151 lb (68.5 kg)   BMI 27.62 kg/m  General:   Alert and oriented. No distress noted. Pleasant and  cooperative.  Head:  Normocephalic and atraumatic. Eyes:  Conjuctiva clear without scleral icterus. Mouth:  Oral mucosa pink and moist. Good dentition. No lesions. Heart: Normal rate and rhythm, s1 and s2 heart sounds present.  Lungs: Clear lung sounds in all lobes. Respirations equal and unlabored. Abdomen:  +BS, soft, and non-distended. No rebound or guarding. No HSM or masses noted. TTP of upper mid to Left upper abdomen.  Neurologic:  Alert and  oriented x4 Psych:  Alert and cooperative. Normal mood and affect.  Invalid input(s): "6 MONTHS"   ASSESSMENT: Sinaya Minogue is a 73 y.o. female presenting today for follow up of GERD, Constipation and upper abdominal pain   GERD:maintained on protonix 40mg  for many years, also taking famotidine 40mg  at bedtime but having breakthrough symptoms a few times per week. Will stop protonix and start omeprazole 40mg  daily. She can continue with famotidine for now but if symptoms are well controlled can trial off of this. Should continue with good reflux precautions as well.   Constipation:well managed with amitiza BID. Having a BM every day to every other day without issue. Will continue with current bowel regimen, good water intake, diet high in fruits, veggies and whole grains.   Upper abdominal pain: worsening over the past few months. EGD as above with gastritis but no other findings. Noting pain wakes her up at night sometimes. She has had some weight loss but is also on ozempic. She denies rectal bleeding/melena, nausea, vomiting, no worsening of pain postprandially, therefore no real suspicion for an ischemic etiology at this time. Will obtain CT A/P with contrast to rule out other organic causes of her symptoms. May consider this to be a component of function abdominal pain if CT is unremarkable and change in PPI therapy does not improve her symptoms, could consider low dose SSRI or TCA in the future.    PLAN:  -Schedule CT A/P with  contrast -stop pantoprazole -start omeprazole 40mg  daily -can continue with famotidine as needed -continue amitiza BID -Increase water intake, aim for atleast 64 oz per day -Increase fruits, veggies and whole grains, kiwi and prunes are especially good for constipation -good reflux precautions   All questions were answered, patient verbalized understanding and is in agreement with plan as  outlined above.    Follow Up: 3 months   Deloma Spindle L. Jeanmarie Hubert, MSN, APRN, AGNP-C Adult-Gerontology Nurse Practitioner [facility]

## 2023-08-21 ENCOUNTER — Encounter (INDEPENDENT_AMBULATORY_CARE_PROVIDER_SITE_OTHER): Payer: Self-pay | Admitting: *Deleted

## 2023-08-23 ENCOUNTER — Other Ambulatory Visit: Payer: Self-pay

## 2023-08-23 ENCOUNTER — Ambulatory Visit: Payer: HMO | Admitting: Allergy & Immunology

## 2023-08-23 ENCOUNTER — Encounter: Payer: Self-pay | Admitting: Allergy & Immunology

## 2023-08-23 VITALS — BP 128/74 | HR 117 | Temp 98.1°F | Resp 16 | Wt 150.4 lb

## 2023-08-23 DIAGNOSIS — J339 Nasal polyp, unspecified: Secondary | ICD-10-CM

## 2023-08-23 DIAGNOSIS — J4489 Other specified chronic obstructive pulmonary disease: Secondary | ICD-10-CM

## 2023-08-23 DIAGNOSIS — K219 Gastro-esophageal reflux disease without esophagitis: Secondary | ICD-10-CM | POA: Diagnosis not present

## 2023-08-23 DIAGNOSIS — J310 Chronic rhinitis: Secondary | ICD-10-CM

## 2023-08-23 NOTE — Progress Notes (Signed)
 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 Asthma-COPD overlap syndrome - Lung testing looked excellent. - I am so glad that you got off of prednisone. - You are doing AWESOME!  - Daily controller medication(s): Breztri two puffs once daily  + Dupixent 300 mg every two weeks - Prior to physical activity: albuterol 2 puffs 10-15 minutes before physical activity. - Rescue medications: albuterol 4 puffs every 4-6 hours as needed - Asthma control goals:  * Full participation in all desired activities (may need albuterol before activity) * Albuterol use two time or less a week on average (not counting use with activity) * Cough interfering with sleep two time or less a month * Oral steroids no more than once a year * No hospitalizations  2. Chronic non-allergic rhinitis - Continue with the loratadine 10mg  daily. - Continue with a nasal steroid on Atrovent (ipratropium) one spray per nostril daily (can use up to 3 times daily if needed, but this can be overdrying).  3. Return in about 6 months (around 02/23/2024). You can have the follow up appointment with Dr. Dellis Anes or a Nurse Practicioner (our Nurse Practitioners are excellent and always have Physician oversight!).   Subjective:   Emily Humphrey is a 73 y.o. female presenting today for follow up of  Chief Complaint  Patient presents with   Follow-up    Emily Humphrey has a history of the following: Patient Active Problem List   Diagnosis Date Noted   Gastritis and gastroduodenitis 08/13/2023   Adenomatous polyp of ascending colon 08/13/2023   LUQ pain 07/02/2023   Nasal polyposis 01/09/2023   Chronic  rhinitis 01/09/2023   Irritable bowel syndrome with constipation 07/31/2022   Type 2 diabetes mellitus with diabetic polyneuropathy, with long-term current use of insulin (HCC) 07/16/2022   Type 2 diabetes mellitus with hyperglycemia, with long-term current use of insulin (HCC) 07/16/2022   DOE (dyspnea on exertion) 02/17/2021   Cerebral vascular disease 09/06/2020   Mild cognitive impairment 09/06/2020   Abnormal finding on MRI of brain 05/10/2020   Gait abnormality 05/10/2020   Essential hypertension, benign 08/01/2015   Type 2 diabetes mellitus, uncontrolled (HCC)    Hyperlipidemia    Asthma-COPD overlap syndrome (HCC)    History of tobacco abuse    Asthma    GERD (gastroesophageal reflux disease)    Morbid obesity (HCC)    Chest pain    Pericardial effusion    Ejection fraction    Pericarditis     History obtained from: chart review and patient.  Discussed the use of AI scribe software for clinical note transcription with the patient and/or guardian, who gave verbal consent to proceed.  [name] is a 73 y.o. female presenting for a follow up visit. She was last seen in September 2024. At that time, her lung testing looked great. We decreased her prednisone from 7.5 mg to 5 mg. We continued with Breztri two puffs BID. We continued with Dupixent and prednisone.   Since the last visit, she has done very well.   Asthma/Respiratory Symptom History: She has a history of severe asthma, previously managed with long-term prednisone use.  Since transitioning to Dupixent, she has experienced significant improvement in her symptoms, with minimal use of her nebulizer. She administers Dupixent every other week and receives it at no cost through a drug company program. She is followed by Dr. Sherene Sires. Her medication regimen has been adjusted, and she is now off prednisone. She is currently on hydrocortisone, taking 10 mg total daily, split as 7.5 mg in the morning and 2.5 mg in the evening. She finds  cutting the pills challenging despite using a pill cutter. Previously, she used [employer] as two puffs in the morning and two at night, but she has reduced this to two puffs in the morning only. She has not required additional inhaler use and is working towards reducing to one puff in the morning.  Allergic Rhinitis Symptom History: She remains on the loratadine daily. She also remains on the Atrovent for her nasal symptoms. She knows that she can use this up to TID. She has not had any sinus infections recently.   She experiences knee problems and neuropathy in her feet, which she is trying to manage before planned travel, including a beach trip and a potential cruise later in the year.   Otherwise, there have been no changes to her past medical history, surgical history, family history, or social history.    Review of systems otherwise negative other than that mentioned in the HPI.    Objective:   Blood pressure 128/74, pulse (!) 117, temperature 98.1 F (36.7 C), resp. rate 16, weight 150 lb 6 oz (68.2 kg), SpO2 97%. Body mass index is 27.5 kg/m.    Physical Exam Vitals reviewed.  Constitutional:      Appearance: She is well-developed.     Comments: Delightful. Talkative.   HENT:     Head: Normocephalic and atraumatic.     Right Ear: Tympanic membrane, ear canal and external ear normal. No drainage, swelling or tenderness. Tympanic membrane is not injected, scarred, erythematous, retracted or bulging.     Left Ear: Tympanic membrane, ear canal and external ear normal. No drainage, swelling or tenderness. Tympanic membrane is not injected, scarred, erythematous, retracted or bulging.     Nose: No nasal deformity, septal deviation, mucosal edema or rhinorrhea.     Right Turbinates: Enlarged, swollen and pale.     Left Turbinates: Enlarged, swollen and pale.     Right Sinus: No maxillary sinus tenderness or frontal sinus tenderness.     Left Sinus: No maxillary sinus tenderness  or frontal sinus tenderness.     Comments: Improvement in bilateral nasal polyposis with approximately 25% obstruction bilaterally.     Mouth/Throat:     Lips: Pink.     Mouth: Mucous membranes are moist. Mucous membranes are not pale and not dry.     Pharynx: Uvula midline.     Comments: Cobblestoning in the posterior oropharynx. Eyes:     General: Lids are normal. Allergic shiner present.        Right eye: No discharge.        Left eye: No discharge.     Conjunctiva/sclera: Conjunctivae normal.     Right eye: Right conjunctiva is not injected. No chemosis.    Left eye: Left conjunctiva is not injected. No chemosis.    Pupils: Pupils are equal, round, and reactive to light.  Cardiovascular:     Rate and Rhythm: Normal rate and regular rhythm.     Heart sounds: Normal heart sounds.  Pulmonary:     Effort: Pulmonary  effort is normal. No tachypnea, accessory muscle usage or respiratory distress.     Breath sounds: Normal breath sounds. No wheezing, rhonchi or rales.     Comments: Moving air well in all lung fields.  No increased work of breathing. Chest:     Chest wall: No tenderness.  Abdominal:     Tenderness: There is no abdominal tenderness. There is no guarding or rebound.  Lymphadenopathy:     Head:     Right side of head: No submandibular, tonsillar or occipital adenopathy.     Left side of head: No submandibular, tonsillar or occipital adenopathy.     Cervical: No cervical adenopathy.  Skin:    General: Skin is warm.     Capillary Refill: Capillary refill takes less than 2 seconds.     Coloration: Skin is not pale.     Findings: No abrasion, erythema, petechiae or rash. Rash is not papular, urticarial or vesicular.     Comments: No eczematous or urticarial lesions noted.  Neurological:     Mental Status: She is alert.  Psychiatric:        Behavior: Behavior is cooperative.      Diagnostic studies:    Spirometry: results normal (FEV1: 1.57/77%, FVC: 2.13/82%,  FEV1/FVC: 74%).    Spirometry consistent with normal pattern.   Allergy Studies: none       Malachi Bonds, MD  [facility]

## 2023-08-23 NOTE — Patient Instructions (Addendum)
 1. Asthma-COPD overlap syndrome - Lung testing looked excellent. - I am so glad that you got off of prednisone. - You are doing AWESOME!  - Daily controller medication(s): Breztri two puffs once daily  + Dupixent 300 mg every two weeks - Prior to physical activity: albuterol 2 puffs 10-15 minutes before physical activity. - Rescue medications: albuterol 4 puffs every 4-6 hours as needed - Asthma control goals:  * Full participation in all desired activities (may need albuterol before activity) * Albuterol use two time or less a week on average (not counting use with activity) * Cough interfering with sleep two time or less a month * Oral steroids no more than once a year * No hospitalizations  2. Chronic non-allergic rhinitis - Continue with the loratadine 10mg  daily. - Continue with a nasal steroid on Atrovent (ipratropium) one spray per nostril daily (can use up to 3 times daily if needed, but this can be overdrying).  3. Return in about 6 months (around 02/23/2024). You can have the follow up appointment with Dr. Dellis Anes or a Nurse Practicioner (our Nurse Practitioners are excellent and always have Physician oversight!).    Please inform us of any Emergency Department visits, hospitalizations, or changes in symptoms. Call us before going to the ED for breathing or allergy symptoms since we might be able to fit you in for a sick visit. Feel free to contact us anytime with any questions, problems, or concerns.  It was a pleasure to see you again today!  Websites that have reliable patient information: 1. American Academy of Asthma, Allergy, and Immunology: www.aaaai.org 2. Food Allergy Research and Education (FARE): foodallergy.org 3. Mothers of Asthmatics: http://www.asthmacommunitynetwork.org 4. American College of Allergy, Asthma, and Immunology: www.acaai.org      "Like" [place] on Facebook and Instagram for our latest updates!      A healthy democracy works best when [employer] participate! Make sure you are registered to vote! If you have moved or changed any of your contact information, you will need to get this updated before voting! Scan the QR codes below to learn more!

## 2023-08-26 MED ORDER — IPRATROPIUM BROMIDE 0.03 % NA SOLN: NASAL | 5 refills | Status: AC

## 2023-09-02 ENCOUNTER — Ambulatory Visit: Payer: Medicare HMO | Admitting: Internal Medicine

## 2023-09-02 NOTE — Progress Notes (Deleted)
 Name: Emily Humphrey  MRN/ DOB: [id number], May 20, 1951   Age/ Sex: 73 y.o., female    PCP: Lianne Moris, PA-C   Reason for Endocrinology Evaluation: Type 2 Diabetes Mellitus     Date of Initial Endocrinology Visit: 07/16/2022    PATIENT IDENTIFIER: Emily Humphrey is a 73 y.o. female with a past medical history of DM, dyslipidemia, COPD, . The patient presented for initial endocrinology clinic visit on 07/16/2022  for consultative assistance with her diabetes management.    HPI:   Diagnosed with DM > 20 yrs   Prior Medications tried/Intolerance: Jardiance- yeast infection Hemoglobin A1c has ranged from 7.5% in 2023, peaking at 11.0% in the past .   Saw  Dr. Talmage Nap in the past     She is on chronic prednisone for Asthma   Took one dose of Ozempic  but PCP advised to stop it as she had low BP and not feeling well at the time  On her initial visit to our clinic she had an A1c of 8.5%, she was on metformin and Novolin mix, I decreased her metformin due to reported diarrhea and adjusted her insulin  SECONDARY ADRENAL INSUFFICIENCY: The patient was on chronic glucocorticoid therapy for asthma-COPD overlap syndrome..    I had switched her from prednisone to hydrocortisone 05/2023  ACTH was normal at 49 PG/mL 05/2023    SUBJECTIVE:   During the last visit (06/03/2023): A1c 6.2%  Today (09/02/23): Emily Humphrey is here for follow-up on diabetes management.  She  checks her blood sugars multiple  times daily. The patient has  had hypoglycemic episodes since the last clinic visit. The patient is  symptomatic with these episodes.   Patient follows with GI for IBS- C  Denies nausea or vomiting She has constipation  , the medications has been causing diarrhea  Patient follows with [facility] for asthma-COPD overlap syndrome Per Dr. Ellouise Newer recommendations we will work on weaning the patient off Glucotrol therapy.  She has been on prednisone 5 mg since 02/20/2023  Denies  SOB , on dupixent  Denies cough to replace prednisone  HOME DIABETES REGIMEN: Metformin 500 mg XR 1 tabs  BID  Ozempic 0.5 mg weekly Novolin Mix 36 units with breakfast and 10 units before Supper  HC 10 mg, 1.5 tablets with breakfast and half a tablet in the afternoon between 2-4 PM    Statin: yes ACE-I/ARB: no    CONTINUOUS GLUCOSE MONITORING RECORD INTERPRETATION    Dates of Recording: 12/3-12/16/2024  Sensor description: [employer]  Results statistics:   CGM use % of time 97  Average and SD 85/34.8  Time in range  85 %  % Time Above 180 8  % Time above 250 0  % Time Below target 0   Glycemic patterns summary: BGs are optimal throughout the day and night  Hyperglycemic episodes post prandial  Hypoglycemic episodes occurred during the day and night  Overnight periods: Trends down   DIABETIC COMPLICATIONS: Microvascular complications:  Neuropathy  Denies: CKD  Last eye exam: Completed 06/2021  Macrovascular complications:   Denies: CAD, PVD, CVA   PAST HISTORY: Past Medical History:  Past Medical History:  Diagnosis Date   Abnormal gait    Asthma    Chest pain    Hospital, May, 2012,  Pericarditis   COPD (chronic obstructive pulmonary disease) (HCC)    Chronic steroid use   Dyslipidemia    Ejection fraction    Normal, echo, [id number]   GERD (gastroesophageal reflux  disease)    History of tobacco abuse    IDDM (insulin dependent diabetes mellitus)    Morbid obesity (HCC)    OSA (obstructive sleep apnea)    mild/not using C-PAP    Pericardial effusion    Small, echo, circumferential, May, 2012   Pericarditis    Hospitalization, May, 2012   Pneumonia 2010   Tremor    Past Surgical History:  Past Surgical History:  Procedure Laterality Date   ABDOMINAL HYSTERECTOMY     BIOPSY  08/13/2023   Procedure: BIOPSY;  Surgeon: Franky Macho, MD;  [facility];  Service: Endoscopy;;   COLONOSCOPY WITH PROPOFOL N/A 08/13/2023    Procedure: COLONOSCOPY WITH PROPOFOL;  Surgeon: Franky Macho, MD;  [facility];  Service: Endoscopy;  Laterality: N/A;  9:00AM;ASA 2   ESOPHAGOGASTRODUODENOSCOPY (EGD) WITH PROPOFOL N/A 08/13/2023   Procedure: ESOPHAGOGASTRODUODENOSCOPY (EGD) WITH PROPOFOL;  Surgeon: Franky Macho, MD;  [facility];  Service: Endoscopy;  Laterality: N/A;  9:00AM;ASA 2   NASAL SINUS SURGERY     POLYPECTOMY  08/13/2023   Procedure: POLYPECTOMY;  Surgeon: Franky Macho, MD;  [facility];  Service: Endoscopy;;   Removal of throat nodules     vocal cored nodules    Social History:  reports that she quit smoking about 28 years ago. Her smoking use included cigarettes. She started smoking about 48 years ago. She has a 20 pack-year smoking history. She has been exposed to tobacco smoke. She has never used smokeless tobacco. She reports that she does not currently use alcohol. She reports that she does not use drugs. Family History:  Family History  Problem Relation Age of Onset   Heart attack Mother        deceased at age 65   Asthma Mother    Other Father        deceased at age 26   Diabetes Sister      HOME MEDICATIONS: Allergies as of 09/02/2023       Reactions   Atorvastatin    Other reaction(s): Muscle Pain   Empagliflozin    Other reaction(s): yeast   Penicillins Itching   hives   Sulfa Antibiotics Itching   rash        Medication List        Accurate as of September 02, 2023  7:32 AM. If you have any questions, ask your nurse or doctor.          Accu-Chek Aviva Plus test strip Generic drug: glucose blood   albuterol 108 (90 Base) MCG/ACT inhaler Commonly known as: VENTOLIN HFA Inhale 2 puffs into the lungs every 4 (four) hours as needed.   ALPRAZolam 0.25 MG tablet Commonly known as: XANAX Take 0.25 mg by mouth daily as needed.   Breztri Aerosphere 160-9-4.8 MCG/ACT Aero Generic drug: budeson-glycopyrrolate-formoterol Inhale 2 puffs  into the lungs in the morning and at bedtime. What changed: when to take this   CALCIUM PO Take 1 tablet by mouth daily.   donepezil 10 MG tablet Commonly known as: ARICEPT Take 1 tablet (10 mg total) by mouth at bedtime.   Dupixent 300 MG/2ML prefilled syringe Generic drug: dupilumab Inject 300 mg into the skin every 14 (fourteen) days.   famotidine 20 MG tablet Commonly known as: PEPCID Take 1 tablet (20 mg total) by mouth daily.   FreeStyle Libre 14 Day Reader Hardie Pulley by Does not apply route.   FreeStyle [employer] 2 Sensor Misc  by Does not apply route.   FreeStyle Libre 3 Sensor Misc Change sensor every 14 days   gabapentin 600 MG tablet Commonly known as: NEURONTIN Take 600 mg by mouth as needed.   HYDROcodone-acetaminophen 5-325 MG tablet Commonly known as: NORCO/VICODIN Take 1 tablet by mouth every 6 (six) hours as needed.   hydrocortisone 10 MG tablet Commonly known as: CORTEF Take 1 tablet (10 mg total) by mouth as directed. 1.5 tab QAM and half a tab between 2-4 pm   Insulin Pen Needle 32G X 4 MM Misc 1 Device by Does not apply route in the morning and at bedtime.   ipratropium 0.03 % nasal spray Commonly known as: ATROVENT Place one spray per nostril daily (can use up to 3 times daily if needed, but this can be over drying)   lubiprostone 8 MCG capsule Commonly known as: Amitiza Take 1 capsule (8 mcg total) by mouth 2 (two) times daily with a meal.   meloxicam 7.5 MG tablet Commonly known as: MOBIC Take 7.5 mg by mouth 2 (two) times daily.   metFORMIN 500 MG 24 hr tablet Commonly known as: GLUCOPHAGE-XR Take 1 tablet (500 mg total) by mouth 2 (two) times daily.   NovoLIN 70/30 Kwikpen (70-30) 100 UNIT/ML KwikPen Generic drug: insulin isophane & regular human KwikPen Inject 36 Units into the skin daily before breakfast AND 10 Units daily before supper.   omeprazole 40 MG capsule Commonly known as: PRILOSEC Take 1 capsule (40 mg total) by mouth  daily.   Potassium 99 MG Tabs Take 1 tablet by mouth daily.   PROBIOTIC DAILY PO Take by mouth. One daily   Semaglutide(0.25 or 0.5MG /DOS) 2 MG/3ML Sopn Inject 0.5 mg into the skin once a week.   simvastatin 20 MG tablet Commonly known as: ZOCOR Take 20 mg by mouth daily.   traZODone 50 MG tablet Commonly known as: DESYREL Take 50 mg by mouth at bedtime.   triamcinolone cream 0.1 % Commonly known as: KENALOG SMARTSIG:sparingly Topical Twice Daily   venlafaxine XR 75 MG 24 hr capsule Commonly known as: EFFEXOR-XR Take 75 mg by mouth daily.   VITAMIN D (CHOLECALCIFEROL) PO Take by mouth daily at 6 (six) AM.         ALLERGIES: Allergies  Allergen Reactions   Atorvastatin     Other reaction(s): Muscle Pain   Empagliflozin     Other reaction(s): yeast   Penicillins Itching    hives   Sulfa Antibiotics Itching    rash     REVIEW OF SYSTEMS: A comprehensive ROS was conducted with the patient and is negative except as per HPI     OBJECTIVE:   VITAL SIGNS: There were no vitals taken for this visit.   There were no vitals filed for this visit.   PHYSICAL EXAM:  General: Pt appears well and is in NAD  Neck: General: Supple without adenopathy or carotid bruits. Thyroid: Thyroid size normal.  No goiter or nodules appreciated.   Lungs: Clear with good BS bilat with no rales, rhonchi, or wheezes  Heart: RRR   Extremities:  Lower extremities - No pretibial edema.   Neuro: MS is good with appropriate affect, pt is alert and Ox3    DM foot exam: 07/16/2022  The skin of the feet is intact without sores or ulcerations. The pedal pulses are 2+ on right and 2+ on left. The sensation is intact to a screening 5.07, 10 gram monofilament bilaterally   DATA REVIEWED:  Lab  Results  Component Value Date   HGBA1C 6.2 (A) 06/03/2023   HGBA1C 7.4 10/24/2022   HGBA1C 9.2 (H) 09/28/2015     10/24/2022 BUN 11 GFR 65 TG 154 LDL 61 A1c 7.4%     ASSESSMENT /  PLAN / RECOMMENDATIONS:   1) Type 2 Diabetes Mellitus,  Optimally controlled, With neuropathic  complications - Most recent A1c of 6.2 %. Goal A1c < 7.0 %.    -Historically she has endorsed chronic diarrhea that she attributed to metformin, after reducing metformin by 50%, she has noted dramatic improvement to the diarrhea - Has intolerance to Jardiance due to recurrent yeast infections -She is tolerating Ozempic, we have opted to remain on the current dose as she just recently increased it from 0.25 to 0.5 mg weekly -Patient has been noted with hypoglycemia on CGM download, will adjust insulin as below  MEDICATIONS: Continue metformin 500 mg XR 1  tab BID  Continue Ozempic 0.5 mg weekly Change Novolin Mix to 36 units before breakfast and 10 units before supper   EDUCATION / INSTRUCTIONS: BG monitoring instructions: Patient is instructed to check her blood sugars 2 times a day. Call [facility] if: BG persistently < 70  I reviewed the Rule of 15 for the treatment of hypoglycemia in detail with the patient. Literature supplied.   2) Diabetic complications:  Eye: Does not have known diabetic retinopathy.  Neuro/ Feet: Does  have known diabetic peripheral neuropathy. Renal: Patient does not have known baseline CKD. She is not on an ACEI/ARB at present.   3) Secondary Adrenal Insufficiency:   -She has been on chronic glucocorticoid therapy for asthma-COPD overlap -She is ready to wean off glucocorticoids through her allergist -She has been on prednisone 5 mg since September 2024 -We will switch to hydrocortisone as below -ACTH pending today -Sick day will discuss today  Medication Start hydrocortisone 10 mg, 1.5 tablets with breakfast and half a tablet in the afternoon between 2-4 PM  Signed electronically by: Lyndle Herrlich, MD  [facility]  [facility] [address] [place] [place], [place] [address] Phone:  [phone] FAX: [phone]   CC: [place] [address] [place] [address] Phone: [phone]  Fax: [phone]    Return to Endocrinology clinic as below: Future Appointments  Date Time Provider Department Center  09/02/2023 10:50 AM Veronika Heard, Konrad Dolores, MD [facility] None  09/10/2023 12:30 PM [facility] [place] [facility]  02/26/2024 11:15 AM Alfonse Spruce, MD [facility] None

## 2023-09-03 ENCOUNTER — Other Ambulatory Visit (HOSPITAL_COMMUNITY): Payer: Self-pay

## 2023-09-05 DIAGNOSIS — M5030 Other cervical disc degeneration, unspecified cervical region: Secondary | ICD-10-CM | POA: Diagnosis not present

## 2023-09-05 DIAGNOSIS — M545 Low back pain, unspecified: Secondary | ICD-10-CM | POA: Diagnosis not present

## 2023-09-05 DIAGNOSIS — M533 Sacrococcygeal disorders, not elsewhere classified: Secondary | ICD-10-CM | POA: Diagnosis not present

## 2023-09-05 DIAGNOSIS — M542 Cervicalgia: Secondary | ICD-10-CM | POA: Diagnosis not present

## 2023-09-05 DIAGNOSIS — M5416 Radiculopathy, lumbar region: Secondary | ICD-10-CM | POA: Diagnosis not present

## 2023-09-10 ENCOUNTER — Ambulatory Visit (HOSPITAL_COMMUNITY)

## 2023-09-10 ENCOUNTER — Telehealth (INDEPENDENT_AMBULATORY_CARE_PROVIDER_SITE_OTHER): Payer: Self-pay

## 2023-09-10 NOTE — Telephone Encounter (Signed)
 Patient called today states has CT scan scheduled for today, and her blood sugar keeps dropping. She wants to know what she can drink to keep bs up. She was told to be NPO, and could have clear liquids up to 4 hours prior to the scan. I spoke with Kenney Houseman the scheduler whom states she could drink clear grape juice to get her sugars up. I made the patient aware of this, she states understanding.

## 2023-09-19 DIAGNOSIS — Z1330 Encounter for screening examination for mental health and behavioral disorders, unspecified: Secondary | ICD-10-CM | POA: Diagnosis not present

## 2023-09-19 DIAGNOSIS — M47816 Spondylosis without myelopathy or radiculopathy, lumbar region: Secondary | ICD-10-CM | POA: Diagnosis not present

## 2023-09-19 DIAGNOSIS — M170 Bilateral primary osteoarthritis of knee: Secondary | ICD-10-CM | POA: Diagnosis not present

## 2023-09-19 DIAGNOSIS — M47812 Spondylosis without myelopathy or radiculopathy, cervical region: Secondary | ICD-10-CM | POA: Diagnosis not present

## 2023-09-19 NOTE — Patient Instructions (Addendum)
 Asthma COPD overlap syndrome Continue Breztri 2 puffs twice a day with a spacer to prevent cough or wheeze Continue albuterol 2 puffs every 4 hours as needed for cough or wheeze OR Instead use albuterol 0.083% solution via nebulizer one unit vial every 4 hours as needed for cough or wheeze You may use albuterol 2 puffs 5 to 15 minutes before activity to decrease cough or wheeze Continue Dupixent injections 300 mg once every 14 days for asthma control For now and for asthma flare, begin Flovent 110-2 puffs once a day for 1-2 weeks or until cough and wheeze free If no improvement with your breathing we can evaluate Dupixent efficacy at your follow up visit  Chronic rhinitis Continue allergen avoidance measures directed toward mold as listed below Continue loratadine 10 mg once a day as needed for runny nose or itch Continue Atrovent 2 sprays in each nostril twice a day as needed for runny nose Begin Ryaltris 2 sprays in each nostril twice a day fir nasal symtpoms  Nasal polyposis Begin a steroid spray daily- Flonase, Nasacort or Nasonex. Continue Dupixent once every other week  Reflux Continue dietary and lifestyle modifications as listed below Continue to follow up with your gastroenterologist as recommended  Call the clinic if this treatment plan is not working well for you.  Follow up in 2 months or sooner if needed.  Control of Mold Allergen Mold and fungi can grow on a variety of surfaces provided certain temperature and moisture conditions exist.  Outdoor molds grow on plants, decaying vegetation and soil.  The major outdoor mold, Alternaria and Cladosporium, are found in very high numbers during hot and dry conditions.  Generally, a late Summer - Fall peak is seen for common outdoor fungal spores.  Rain will temporarily lower outdoor mold spore count, but counts rise rapidly when the rainy period ends.  The most important indoor molds are Aspergillus and Penicillium.  Dark, humid and  poorly ventilated basements are ideal sites for mold growth.  The next most common sites of mold growth are the bathroom and the kitchen.  Outdoor [employer] Use air conditioning and keep windows closed Avoid exposure to decaying vegetation. Avoid leaf raking. Avoid grain handling. Consider wearing a face mask if working in moldy areas.  Indoor Mold Control Maintain humidity below 50%. Clean washable surfaces with 5% bleach solution. Remove sources e.g. Contaminated carpets.

## 2023-09-19 NOTE — Progress Notes (Cosign Needed Addendum)
 [address] [place] [address] Dept: [phone]  FOLLOW UP NOTE  Patient ID: Emily Humphrey, female    DOB: May 31, 1951  Age: 73 y.o. MRN: [id number] Date of Office Visit: 09/20/2023  Assessment  Chief Complaint: Follow-up (Chest tightness overall not feeling well.), Cough, and Wheezing  HPI Emily Humphrey is a 73 year old female who presents to the clinic for a folow up visit. She was last seen in this clinic on 02/20/2023 by Dr. Dellis Anes for evaluation of ACO, Chronic rhinitis, nasal polyposis, reflux, and chronic prednisone use. She continues to follow up with pulm and GI  At today's visit, she reports her asthma has not been well controlled over the last 2 weeks with symptoms including shortness of breath which is worse with activity, wheeze occurring in the day and night, and cough with thick yellow mucus occurring in the morning and cleaning throughout the day. She continues Breztri 2 puffs twice a day and reports that she used her albuterol 10 out of the last 14 days with only mild relief of symptoms. She continues Dupixent 300 mg once every 14 days with some improvement in her asthma symptoms.  She continues to follow-up with her endocrinologist and is not currently taking any prednisone and has switched over to Cortef 10 mg as needed.  Chronic rhinitis is reported as moderately well controlled with symptoms including clear rhinorrhea, nasal congestion, sneezing, and post nasal drainage. She reports some fullness in her ears, however, denies ear pain or drainage from either ear. She continues Claritin D and Atrovent nasal spray daily. She is not currently using Flonase or azelastine.   Nasal polyposis is reported as moderately well-controlled with intermittent symptoms including anosmia and ageusia.  She continues Dupixent 300 mg once every 14 days and is not currently using Flonase nasal spray.  Reflux is reported as moderately well-controlled with heartburn as the  main symptom.  She denies vomiting.  She continues to follow-up with her gastroenterologist for management of reflux.  Her current medications are listed in the chart.  Allergies  Allergen Reactions   Atorvastatin     Other reaction(s): Muscle Pain   Empagliflozin     Other reaction(s): yeast   Penicillins Itching    hives   Sulfa Antibiotics Itching    rash    Physical Exam: BP 120/70   Pulse (!) 101   Temp 98.3 F (36.8 C)   Resp 16   SpO2 96%    Physical Exam Vitals reviewed.  Constitutional:      Appearance: Normal appearance.  HENT:     Head: Normocephalic and atraumatic.     Right Ear: Tympanic membrane normal.     Left Ear: Tympanic membrane normal.     Nose:     Comments: Bilateral nares slightly erythematous with thin clear nasal drainage noted.  Pharynx normal.  Ears normal.  Eyes normal.    Mouth/Throat:     Pharynx: Oropharynx is clear.  Eyes:     Conjunctiva/sclera: Conjunctivae normal.  Cardiovascular:     Rate and Rhythm: Normal rate and regular rhythm.     Heart sounds: Normal heart sounds. No murmur heard. Pulmonary:     Effort: Pulmonary effort is normal.     Breath sounds: Normal breath sounds.     Comments: Lungs clear to auscultation Musculoskeletal:        General: Normal range of motion.     Cervical back: Normal range of motion and neck supple.  Skin:  General: Skin is warm and dry.  Neurological:     Mental Status: She is alert and oriented to person, place, and time.  Psychiatric:        Mood and Affect: Mood normal.        Behavior: Behavior normal.        Thought Content: Thought content normal.        Judgment: Judgment normal.     Diagnostics: FVC 2.08 which is 79% of predicted value, FEV1 1.64 which is 80% of predicted value. Spirometry indicates normal ventilatory function  Assessment and Plan: 1. Not well controlled severe persistent asthma   2. Asthma-COPD overlap syndrome (HCC)   3. Nasal polyposis   4. Chronic  rhinitis   5. Gastroesophageal reflux disease, unspecified whether esophagitis present     Meds ordered this encounter  Medications   albuterol (VENTOLIN HFA) 108 (90 Base) MCG/ACT inhaler    Sig: Inhale 2 puffs into the lungs every 4 (four) hours as needed.    Dispense:  17 each    Refill:  6   Olopatadine-Mometasone (RYALTRIS) 665-25 MCG/ACT SUSP    Sig: Place 2 sprays into the nose in the morning and at bedtime.    Dispense:  29 g    Refill:  5    Patient Instructions  Asthma COPD overlap syndrome Continue Breztri 2 puffs twice a day with a spacer to prevent cough or wheeze Continue albuterol 2 puffs every 4 hours as needed for cough or wheeze OR Instead use albuterol 0.083% solution via nebulizer one unit vial every 4 hours as needed for cough or wheeze You may use albuterol 2 puffs 5 to 15 minutes before activity to decrease cough or wheeze Continue Dupixent injections 300 mg once every 14 days for asthma control For now and for asthma flare, begin Flovent 110-2 puffs once a day for 1-2 weeks or until cough and wheeze free If no improvement with your breathing we can evaluate Dupixent efficacy at your follow up visit  Chronic rhinitis Continue allergen avoidance measures directed toward mold as listed below Continue loratadine 10 mg once a day as needed for runny nose or itch Continue Atrovent 2 sprays in each nostril twice a day as needed for runny nose Begin Ryaltris 2 sprays in each nostril twice a day fir nasal symtpoms  Nasal polyposis Begin a steroid spray daily- Flonase, Nasacort or Nasonex. Continue Dupixent once every other week  Reflux Continue dietary and lifestyle modifications as listed below Continue to follow up with your gastroenterologist as recommended  Call the clinic if this treatment plan is not working well for you.  Follow up in 2 months or sooner if needed.   Return in about 2 months (around 11/20/2023), or if symptoms worsen or fail to  improve.    Thank you for the opportunity to care for this patient.  Please do not hesitate to contact me with questions.  Thermon Leyland, FNP [facility]

## 2023-09-20 ENCOUNTER — Other Ambulatory Visit: Payer: Self-pay

## 2023-09-20 ENCOUNTER — Ambulatory Visit: Admitting: Family Medicine

## 2023-09-20 ENCOUNTER — Other Ambulatory Visit: Payer: Self-pay | Admitting: Family Medicine

## 2023-09-20 VITALS — BP 120/70 | HR 101 | Temp 98.3°F | Resp 16

## 2023-09-20 DIAGNOSIS — J4550 Severe persistent asthma, uncomplicated: Secondary | ICD-10-CM

## 2023-09-20 DIAGNOSIS — J4489 Other specified chronic obstructive pulmonary disease: Secondary | ICD-10-CM

## 2023-09-20 DIAGNOSIS — J310 Chronic rhinitis: Secondary | ICD-10-CM | POA: Diagnosis not present

## 2023-09-20 DIAGNOSIS — K219 Gastro-esophageal reflux disease without esophagitis: Secondary | ICD-10-CM | POA: Diagnosis not present

## 2023-09-20 DIAGNOSIS — J339 Nasal polyp, unspecified: Secondary | ICD-10-CM | POA: Diagnosis not present

## 2023-09-20 MED ORDER — ALBUTEROL SULFATE HFA 108 (90 BASE) MCG/ACT IN AERS: 2.0000 | INHALATION_SPRAY | RESPIRATORY_TRACT | 6 refills | Status: AC | PRN

## 2023-09-20 MED ORDER — RYALTRIS 665-25 MCG/ACT NA SUSP
2.0000 | Freq: Two times a day (BID) | NASAL | 5 refills | Status: DC
Start: 2023-09-20 — End: 2023-12-31

## 2023-09-20 NOTE — Progress Notes (Signed)
..  Medication Samples have been provided to the patient.  Drug name: Ryaltris       Strength: 665/25mcg        Qty: 1  LOT: [id number]  Exp.Date: 09/15/24  Dosing instructions: 2 sprays in each nostril twice a day for nasal symtpoms  The patient has been instructed regarding the correct time, dose, and frequency of taking this medication, including desired effects and most common side effects.   Lynnae Sandhoff Mickie Kozikowski 4:24 PM 09/20/2023

## 2023-09-22 ENCOUNTER — Encounter: Payer: Self-pay | Admitting: Family Medicine

## 2023-09-23 ENCOUNTER — Other Ambulatory Visit: Payer: Self-pay

## 2023-09-23 MED ORDER — AZELASTINE-FLUTICASONE 137-50 MCG/ACT NA SUSP: NASAL | 3 refills | Status: AC

## 2023-09-23 NOTE — Telephone Encounter (Signed)
 Lets try dymista please. 2 sprays in each nostril up to twice a day if needed for nasal symtpoms. Thank you

## 2023-09-23 NOTE — Telephone Encounter (Signed)
 Ryaltris is not covered by patient's insurance. The alternatives per [employer] website are azelastine and ipratropium bromide (tier 2). Please advise change.

## 2023-09-26 DIAGNOSIS — Z780 Asymptomatic menopausal state: Secondary | ICD-10-CM | POA: Diagnosis not present

## 2023-09-26 DIAGNOSIS — M810 Age-related osteoporosis without current pathological fracture: Secondary | ICD-10-CM | POA: Diagnosis not present

## 2023-10-02 ENCOUNTER — Telehealth: Payer: Self-pay | Admitting: Family Medicine

## 2023-10-02 DIAGNOSIS — M47816 Spondylosis without myelopathy or radiculopathy, lumbar region: Secondary | ICD-10-CM | POA: Diagnosis not present

## 2023-10-02 DIAGNOSIS — R29898 Other symptoms and signs involving the musculoskeletal system: Secondary | ICD-10-CM | POA: Diagnosis not present

## 2023-10-02 DIAGNOSIS — G8929 Other chronic pain: Secondary | ICD-10-CM | POA: Diagnosis not present

## 2023-10-02 DIAGNOSIS — M545 Low back pain, unspecified: Secondary | ICD-10-CM | POA: Diagnosis not present

## 2023-10-02 DIAGNOSIS — Z9181 History of falling: Secondary | ICD-10-CM | POA: Diagnosis not present

## 2023-10-02 DIAGNOSIS — M542 Cervicalgia: Secondary | ICD-10-CM | POA: Diagnosis not present

## 2023-10-02 DIAGNOSIS — M170 Bilateral primary osteoarthritis of knee: Secondary | ICD-10-CM | POA: Diagnosis not present

## 2023-10-02 DIAGNOSIS — M25562 Pain in left knee: Secondary | ICD-10-CM | POA: Diagnosis not present

## 2023-10-02 DIAGNOSIS — M436 Torticollis: Secondary | ICD-10-CM | POA: Diagnosis not present

## 2023-10-02 DIAGNOSIS — R269 Unspecified abnormalities of gait and mobility: Secondary | ICD-10-CM | POA: Diagnosis not present

## 2023-10-02 DIAGNOSIS — M25561 Pain in right knee: Secondary | ICD-10-CM | POA: Diagnosis not present

## 2023-10-02 DIAGNOSIS — M47812 Spondylosis without myelopathy or radiculopathy, cervical region: Secondary | ICD-10-CM | POA: Diagnosis not present

## 2023-10-02 NOTE — Telephone Encounter (Signed)
 Patient called stating she is needing an alternative for Ryaltris as insurance is not covering her medication.

## 2023-10-02 NOTE — Telephone Encounter (Signed)
 Please advise on alternative nasal spray other that the Ryaltris nasal spray patient has used Atrovent for years per her medication history.    Patient usually see Dr. Idolina Maker, but he is currently out of the office.

## 2023-10-03 NOTE — Telephone Encounter (Signed)
 Please send in Dymista. It might be covered. Thank you

## 2023-10-07 ENCOUNTER — Telehealth: Payer: Self-pay | Admitting: Internal Medicine

## 2023-10-07 MED ORDER — AZELASTINE HCL 0.1 % NA SOLN
2.0000 | Freq: Two times a day (BID) | NASAL | 5 refills | Status: DC
Start: 2023-10-07 — End: 2024-03-18

## 2023-10-07 MED ORDER — FLUTICASONE PROPIONATE 50 MCG/ACT NA SUSP: 2.0000 | Freq: Two times a day (BID) | NASAL | 5 refills | Status: DC

## 2023-10-07 NOTE — Telephone Encounter (Signed)
 Late entry: Called over the weekend (Saturday, around 10:30 am) with low blood sugars in the last few weeks. She describes having a blood sugar of 64 on Friday night.  She did not take insulin  at that time.  She woke up with blood sugar at 92, which increased after breakfast 169 (without insulin ). She does describe not having an appetite and not eating well for the last few weeks.  She is on a regimen of  Ozempic  0.25 mg - for the last year Novolin  70/30 36 units in the morning and 10 units before dinner I advised her to decrease the doses of insulin  to 15 units in a.m. and 5 minutes before dinner.  I advised her to make sure that she is taking the insulin  15 to 30 minutes before meals.  However, if she continues to have lows and no appetite, I advised her to try to stop Ozempic  at least for few weeks.

## 2023-10-07 NOTE — Telephone Encounter (Signed)
 Dymista   is not covered by the patient's insurance. I sent in the Flonase  and Astelin  nasal spray separately to [facility]. I called the patient and left a message about medication change and told her to call the [place] office with any questions or concerns.

## 2023-10-08 ENCOUNTER — Telehealth (INDEPENDENT_AMBULATORY_CARE_PROVIDER_SITE_OTHER): Payer: Self-pay

## 2023-10-08 DIAGNOSIS — R300 Dysuria: Secondary | ICD-10-CM | POA: Diagnosis not present

## 2023-10-08 DIAGNOSIS — R634 Abnormal weight loss: Secondary | ICD-10-CM

## 2023-10-08 DIAGNOSIS — R32 Unspecified urinary incontinence: Secondary | ICD-10-CM | POA: Diagnosis not present

## 2023-10-08 DIAGNOSIS — Z6825 Body mass index (BMI) 25.0-25.9, adult: Secondary | ICD-10-CM | POA: Diagnosis not present

## 2023-10-08 DIAGNOSIS — R1012 Left upper quadrant pain: Secondary | ICD-10-CM

## 2023-10-08 NOTE — Telephone Encounter (Signed)
 Patient called today states she would like to see if we can move her Ct scan up as she has still be having LUQ pain/ tenderness, patient says this has been ongoing for quite some time. She was last seen here on 08/20/2023 by Gayle Kava, Np. Ct scan is currently scheduled for 10/28/2023. Patient says She is taking Omeprazole  40 mg daily, Famotidine  prn, Lubiprostone  8 mcg bid. She says she is not having any issues with constipation currently. She is not taking any anti spasmatic medications. She uses [facility]. Please advise. Thanks,

## 2023-10-09 NOTE — Telephone Encounter (Signed)
 Contacted Regulatory affairs officer. Order changed to STAT to get pt in quicker. Pt will need BUN/Creatine lab work. Pt now scheduled for 10/11/23 at 3:00pm. Pt contacted and verbalized understanding.

## 2023-10-09 NOTE — Telephone Encounter (Signed)
Noted. Thanks,

## 2023-10-11 ENCOUNTER — Ambulatory Visit (HOSPITAL_COMMUNITY)

## 2023-10-11 ENCOUNTER — Other Ambulatory Visit (HOSPITAL_COMMUNITY)
Admission: RE | Admit: 2023-10-11 | Discharge: 2023-10-11 | Disposition: A | Discharge status: Home / Self Care | Source: Ambulatory Visit | Attending: Gastroenterology | Admitting: Gastroenterology

## 2023-10-11 ENCOUNTER — Encounter (HOSPITAL_COMMUNITY): Payer: Self-pay

## 2023-10-11 ENCOUNTER — Ambulatory Visit (HOSPITAL_COMMUNITY)
Admission: RE | Admit: 2023-10-11 | Discharge: 2023-10-11 | Disposition: A | Discharge status: Home / Self Care | Source: Ambulatory Visit | Attending: Gastroenterology | Admitting: Gastroenterology

## 2023-10-11 ENCOUNTER — Encounter (HOSPITAL_COMMUNITY): Payer: Self-pay | Admitting: Radiology

## 2023-10-11 DIAGNOSIS — R1012 Left upper quadrant pain: Secondary | ICD-10-CM | POA: Insufficient documentation

## 2023-10-11 DIAGNOSIS — R634 Abnormal weight loss: Secondary | ICD-10-CM

## 2023-10-11 DIAGNOSIS — K5730 Diverticulosis of large intestine without perforation or abscess without bleeding: Secondary | ICD-10-CM | POA: Diagnosis not present

## 2023-10-11 LAB — COMPREHENSIVE METABOLIC PANEL WITH GFR
ALT: 14 U/L (ref 0–44)
AST: 21 U/L (ref 15–41)
Albumin: 4.1 g/dL (ref 3.5–5.0)
Alkaline Phosphatase: 91 U/L (ref 38–126)
Anion gap: 11 (ref 5–15)
BUN: 13 mg/dL (ref 8–23)
CO2: 23 mmol/L (ref 22–32)
Calcium: 9.6 mg/dL (ref 8.9–10.3)
Chloride: 104 mmol/L (ref 98–111)
Creatinine, Ser: 0.85 mg/dL (ref 0.44–1.00)
GFR, Estimated: >60 mL/min (ref >60)
Glucose, Bld: 166 mg/dL — ABNORMAL HIGH (ref 70–99)
Potassium: 4.8 mmol/L (ref 3.5–5.1)
Sodium: 138 mmol/L (ref 135–145)
Total Bilirubin: 0.4 mg/dL (ref 0.0–1.2)
Total Protein: 7.8 g/dL (ref 6.5–8.1)

## 2023-10-11 MED ORDER — IOHEXOL 300 MG/ML  SOLN
100.0000 mL | Freq: Once | INTRAMUSCULAR | Status: AC | PRN
  Administered 2023-10-11: 100 mL via INTRAVENOUS

## 2023-10-14 ENCOUNTER — Other Ambulatory Visit (INDEPENDENT_AMBULATORY_CARE_PROVIDER_SITE_OTHER): Payer: Self-pay | Admitting: Gastroenterology

## 2023-10-14 ENCOUNTER — Telehealth (INDEPENDENT_AMBULATORY_CARE_PROVIDER_SITE_OTHER): Payer: Self-pay | Admitting: *Deleted

## 2023-10-14 ENCOUNTER — Other Ambulatory Visit (INDEPENDENT_AMBULATORY_CARE_PROVIDER_SITE_OTHER): Payer: Self-pay | Admitting: *Deleted

## 2023-10-14 MED ORDER — METRONIDAZOLE 500 MG PO TABS: 500.0000 mg | ORAL_TABLET | Freq: Two times a day (BID) | ORAL | 0 refills | Status: AC

## 2023-10-14 MED ORDER — METRONIDAZOLE 500 MG PO TABS: 500.0000 mg | ORAL_TABLET | Freq: Two times a day (BID) | ORAL | 0 refills | Status: DC

## 2023-10-14 MED ORDER — CIPROFLOXACIN HCL 500 MG PO TABS: 500.0000 mg | ORAL_TABLET | Freq: Two times a day (BID) | ORAL | 0 refills | Status: DC

## 2023-10-14 NOTE — Telephone Encounter (Signed)
 Patient called and wanted the two scripts sent in today by chelsea [facility]. I called [facility] and canceled cipro and flagyl that was sent today and sent to [facility]. Pt notified.

## 2023-10-16 ENCOUNTER — Telehealth: Payer: Self-pay | Admitting: Family Medicine

## 2023-10-16 NOTE — Telephone Encounter (Signed)
 Patient called stating she is needing nebulizer solution to be sent into [facility] in [place].

## 2023-10-17 MED ORDER — ALBUTEROL SULFATE (2.5 MG/3ML) 0.083% IN NEBU: 2.5000 mg | INHALATION_SOLUTION | RESPIRATORY_TRACT | 0 refills | Status: DC | PRN

## 2023-10-17 NOTE — Telephone Encounter (Signed)
 Called and informed patient of albuterol  prescription being sent in.

## 2023-10-23 ENCOUNTER — Encounter: Payer: Self-pay | Admitting: Internal Medicine

## 2023-10-23 ENCOUNTER — Ambulatory Visit: Admitting: Internal Medicine

## 2023-10-23 VITALS — BP 122/80 | HR 71 | Ht 62.0 in | Wt 146.0 lb

## 2023-10-23 DIAGNOSIS — E1142 Type 2 diabetes mellitus with diabetic polyneuropathy: Secondary | ICD-10-CM | POA: Diagnosis not present

## 2023-10-23 DIAGNOSIS — E1165 Type 2 diabetes mellitus with hyperglycemia: Secondary | ICD-10-CM

## 2023-10-23 DIAGNOSIS — E2749 Other adrenocortical insufficiency: Secondary | ICD-10-CM | POA: Diagnosis not present

## 2023-10-23 DIAGNOSIS — Z794 Long term (current) use of insulin: Secondary | ICD-10-CM | POA: Diagnosis not present

## 2023-10-23 LAB — POCT GLYCOSYLATED HEMOGLOBIN (HGB A1C): Hemoglobin A1C: 6.6 % — AB (ref 4.0–5.6)

## 2023-10-23 NOTE — Patient Instructions (Addendum)
  You will be scheduled to return for a fasting, 8 AM cortisol checkup   Please do not take the  afternoon dose of hydrocortisone  the day prior to the lab appointment, but take the morning dose of hydrocortisone  the day prior to the lab appointment  Do not take morning hydrocortisone  the day you come for fasting labs, but bring it with you so you can take it as soon as the lab is done and you continue with the regular hydrocortisone  intake until your results come back a week or you otherwise    Continue Metformin  500 mg XR 1 tablet before Breakfast and 1 tablet before Supper  Continue Ozempic  0.5 mg weekly  Change Novolin  Mix 26  units before  breakfast and 10 units before Supper      ADRENAL INSUFFICIENCY SICK DAY RULES:  Should you face an extreme emotional or physical stress such as trauma, surgery or acute illness, this will require extra steroid coverage so that the body can meet that stress.   Without increasing the steroid dose you may experience severe weakness, headache, dizziness, nausea and vomiting and possibly a more serious deterioration in health.  Typically the dose of steroids will only need to be increased for a couple of days if you have an illness that is transient and managed in the community.   If you are unable to take/absorb an increased dose of steroids orally because of vomiting or diarrhea, you will urgently require steroid injections and should present to an Emergency Department.  The general advice for any serious illness is as follows: Double the normal daily steroid dose for up to 3 days if you have a temperature of more than 37.50C (99.41F) with signs of sickness, or severe emotional or physical distress Contact your primary care doctor and Endocrinologist if the illness worsens or it lasts for more than 3 days.  In cases of severe illness, urgent medical assistance should be promptly sought. If you experience vomiting/diarrhea or are unable to take  steroids by mouth, please administer the Hydrocortisone  injection kit and seek urgent medical help.     HOW TO TREAT LOW BLOOD SUGARS (Blood sugar LESS THAN 70 MG/DL) Please follow the RULE OF 15 for the treatment of hypoglycemia treatment (when your (blood sugars are less than 70 mg/dL)   STEP 1: Take 15 grams of carbohydrates when your blood sugar is low, which includes:  3-4 GLUCOSE TABS  OR 3-4 OZ OF JUICE OR REGULAR SODA OR ONE TUBE OF GLUCOSE GEL    STEP 2: RECHECK blood sugar in 15 MINUTES STEP 3: If your blood sugar is still low at the 15 minute recheck --> then, go back to STEP 1 and treat AGAIN with another 15 grams of carbohydrates.

## 2023-10-23 NOTE — Progress Notes (Unsigned)
 Name: Emily Humphrey  MRN/ DOB: [id number], [birth year]   Age/ Sex: 73 y.o., female    PCP: Emily Jarred, PA-C   Reason for Endocrinology Evaluation: Type 2 Diabetes Mellitus     Date of Initial Endocrinology Visit: 07/16/2022    PATIENT IDENTIFIER: Emily Humphrey is a 73 y.o. female with a past medical history of DM, dyslipidemia, COPD, . The patient presented for initial endocrinology clinic visit on 07/16/2022  for consultative assistance with her diabetes management.    HPI:   Diagnosed with DM > 20 yrs   Prior Medications tried/Intolerance: Jardiance- yeast infection Hemoglobin A1c has ranged from 7.5% in 2023, peaking at 11.0% in the past .   Saw  Emily Humphrey in the past     She is on chronic prednisone  for Asthma   Took one dose of Ozempic   but PCP advised to stop it as she had low BP and not feeling well at the time  On her initial visit to our clinic she had an A1c of 8.5%, she was on metformin  and Novolin  mix, I decreased her metformin  due to reported diarrhea and adjusted her insulin   SECONDARY ADRENAL INSUFFICIENCY: The patient was on chronic glucocorticoid therapy for asthma-COPD overlap syndrome..  Per Dr. Vladimir Humphrey recommendations we started working on weaning the patient off glucocorticoid therapy.  I switched from prednisone  to hydrocortisone  05/2023  ACTH  has been normal at 49 PG/mL in  05/2023  SUBJECTIVE:   During the last visit (06/03/2023): A1c 6.2%  Today (10/23/23): Emily Humphrey is here for follow-up on diabetes management.  She  checks her blood sugars multiple  times daily. The patient has had hypoglycemic episodes since the last clinic visit. The patient is  symptomatic with these episodes.  She contacted our office with hypoglycemia and her insulin  regimen was decreased to the on-call provider   Patient follows with GI for IBS- C , she follows with gastroenterology She has been following up with orthopedics for degenerative joint disease Denies  nausea or vomiting Denies dizziness  Denies weakness   HOME DIABETES REGIMEN: Metformin  500 mg XR 1 tabs  BID  Ozempic  0.5 mg weekly Novolin  Mix 30 units with breakfast and 10 units before Supper  Hydrocortisone  10 mg, 1.5 tablets with breakfast and half a tablet in the afternoon between 2-4 PM     Statin: yes ACE-I/ARB: no    CONTINUOUS GLUCOSE MONITORING RECORD INTERPRETATION    Dates of Recording: 4/24-10/23/2023  Sensor description: [employer]  Results statistics:   CGM use % of time 95  Average and SD 142/22.3  Time in range 88 %  % Time Above 180 12  % Time above 250 0  % Time Below target 0   Glycemic patterns summary: BGs are optimal throughout the day and night  Hyperglycemic episodes post prandial  Hypoglycemic episodes occurred during the day and night  Overnight periods: Trends down   DIABETIC COMPLICATIONS: Microvascular complications:  Neuropathy  Denies: CKD  Last eye exam: Completed 06/2021  Macrovascular complications:   Denies: CAD, PVD, CVA   PAST HISTORY: Past Medical History:  Past Medical History:  Diagnosis Date   Abnormal gait    Asthma    Chest pain    Hospital, May, 2012,  Pericarditis   COPD (chronic obstructive pulmonary disease) (HCC)    Chronic steroid use   Dyslipidemia    Ejection fraction    Normal, echo, [id number],[id number]   GERD (gastroesophageal reflux disease)    History of tobacco abuse  IDDM (insulin  dependent diabetes mellitus)    Morbid obesity (HCC)    OSA (obstructive sleep apnea)    mild/not using C-PAP    Pericardial effusion    Small, echo, circumferential, May, 2012   Pericarditis    Hospitalization, May, 2012   Pneumonia 2010   Tremor    Past Surgical History:  Past Surgical History:  Procedure Laterality Date   ABDOMINAL HYSTERECTOMY     BIOPSY  08/13/2023   Procedure: BIOPSY;  Surgeon: Emily Lias, MD;  [facility];  Service: Endoscopy;;   COLONOSCOPY WITH PROPOFOL  N/A  08/13/2023   Procedure: COLONOSCOPY WITH PROPOFOL ;  Surgeon: Emily Lias, MD;  [facility]: [facility];  Service: Endoscopy;  Laterality: N/A;  9:00AM;ASA 2   ESOPHAGOGASTRODUODENOSCOPY (EGD) WITH PROPOFOL  N/A 08/13/2023   Procedure: ESOPHAGOGASTRODUODENOSCOPY (EGD) WITH PROPOFOL ;  Surgeon: Emily Lias, MD;  [facility]: [facility];  Service: Endoscopy;  Laterality: N/A;  9:00AM;ASA 2   NASAL SINUS SURGERY     POLYPECTOMY  08/13/2023   Procedure: POLYPECTOMY;  Surgeon: Emily Lias, MD;  [facility]: [facility];  Service: Endoscopy;;   Removal of throat nodules     vocal cored nodules    Social History:  reports that she quit smoking about 28 years ago. Her smoking use included cigarettes. She started smoking about 48 years ago. She has a 20 pack-year smoking history. She has been exposed to tobacco smoke. She has never used smokeless tobacco. She reports that she does not currently use alcohol . She reports that she does not use drugs. Family History:  Family History  Problem Relation Age of Onset   Heart attack Mother        deceased at age 74   Asthma Mother    Other Father        deceased at age 73   Diabetes Sister      HOME MEDICATIONS: Allergies as of 10/23/2023       Reactions   Atorvastatin    Other reaction(s): Muscle Pain   Empagliflozin    Other reaction(s): yeast   Penicillins Itching   hives   Sulfa Antibiotics Itching   rash        Medication List        Accurate as of Oct 23, 2023 11:05 AM. If you have any questions, ask your nurse or doctor.          STOP taking these medications    triamcinolone  cream 0.1 % Commonly known as: KENALOG Stopped by: Emily Humphrey       TAKE these medications    Accu-Chek Aviva Plus test strip Generic drug: glucose blood   albuterol  108 (90 Base) MCG/ACT inhaler Commonly known as: VENTOLIN  HFA Inhale 2 puffs into the lungs every 4 (four) hours as needed.   albuterol  (2.5 MG/3ML) 0.083%  nebulizer solution Commonly known as: PROVENTIL  Take 3 mLs (2.5 mg total) by nebulization every 4 (four) hours as needed for wheezing or shortness of breath.   ALPRAZolam 0.25 MG tablet Commonly known as: XANAX Take 0.25 mg by mouth daily as needed.   azelastine  0.1 % nasal spray Commonly known as: ASTELIN  Place 2 sprays into both nostrils 2 (two) times daily. Use in each nostril as directed   Azelastine -Fluticasone  137-50 MCG/ACT Susp 2 sprays in each nostril up to twice a day if needed for nasal symtpoms.   Breztri  Aerosphere 160-9-4.8 MCG/ACT Aero inhaler Generic drug: budeson-glycopyrrolate -formoterol Inhale 2 puffs into the lungs  in the morning and at bedtime. What changed: when to take this   CALCIUM PO Take 1 tablet by mouth daily.   celecoxib 200 MG capsule Commonly known as: CELEBREX Take by mouth.   ciprofloxacin  500 MG tablet Commonly known as: CIPRO  Take 1 tablet (500 mg total) by mouth 2 (two) times daily.   donepezil  10 MG tablet Commonly known as: ARICEPT  Take 1 tablet (10 mg total) by mouth at bedtime.   Dupixent  300 MG/2ML prefilled syringe Generic drug: dupilumab  Inject 300 mg into the skin every 14 (fourteen) days.   famotidine  20 MG tablet Commonly known as: PEPCID  Take 1 tablet (20 mg total) by mouth daily.   fluticasone  50 MCG/ACT nasal spray Commonly known as: FLONASE  Place 2 sprays into both nostrils 2 (two) times daily.   FreeStyle Libre 14 Day Reader Seymour Dapper by Does not apply route.   FreeStyle Libre 2 Sensor Misc by Does not apply route.   FreeStyle Libre 3 Sensor Misc Change sensor every 14 days   gabapentin 600 MG tablet Commonly known as: NEURONTIN Take 600 mg by mouth as needed.   HYDROcodone-acetaminophen 5-325 MG tablet Commonly known as: NORCO/VICODIN Take 1 tablet by mouth every 6 (six) hours as needed.   hydrocortisone  10 MG tablet Commonly known as: CORTEF  Take 1 tablet (10 mg total) by mouth as directed. 1.5 tab QAM  and half a tab between 2-4 pm   Insulin  Pen Needle 32G X 4 MM Misc 1 Device by Does not apply route in the morning and at bedtime.   ipratropium 0.03 % nasal spray Commonly known as: ATROVENT  Place one spray per nostril daily (can use up to 3 times daily if needed, but this can be over drying)   lubiprostone  8 MCG capsule Commonly known as: Amitiza  Take 1 capsule (8 mcg total) by mouth 2 (two) times daily with a meal.   meloxicam 7.5 MG tablet Commonly known as: MOBIC Take 7.5 mg by mouth 2 (two) times daily.   metFORMIN  500 MG 24 hr tablet Commonly known as: GLUCOPHAGE -XR Take 1 tablet (500 mg total) by mouth 2 (two) times daily.   metroNIDAZOLE  500 MG tablet Commonly known as: Flagyl  Take 1 tablet (500 mg total) by mouth 2 (two) times daily for 10 days.   nitrofurantoin (macrocrystal-monohydrate) 100 MG capsule Commonly known as: MACROBID Take 100 mg by mouth 2 (two) times daily.   NovoLIN  70/30 Kwikpen (70-30) 100 UNIT/ML KwikPen Generic drug: insulin  isophane & regular human KwikPen Inject 36 Units into the skin daily before breakfast AND 10 Units daily before supper.   omeprazole  40 MG capsule Commonly known as: PRILOSEC Take 1 capsule (40 mg total) by mouth daily.   Potassium 99 MG Tabs Take 1 tablet by mouth daily.   PROBIOTIC DAILY PO Take by mouth. One daily   Ryaltris  665-25 MCG/ACT Susp Generic drug: Olopatadine-Mometasone Place 2 sprays into the nose in the morning and at bedtime.   Semaglutide (0.25 or 0.5MG /DOS) 2 MG/3ML Sopn Inject 0.5 mg into the skin once a week.   simvastatin 20 MG tablet Commonly known as: ZOCOR Take 20 mg by mouth daily.   traZODone 50 MG tablet Commonly known as: DESYREL Take 50 mg by mouth at bedtime.   traZODone 100 MG tablet Commonly known as: DESYREL Take 100 mg by mouth.   venlafaxine XR 75 MG 24 hr capsule Commonly known as: EFFEXOR-XR Take 75 mg by mouth daily.   VITAMIN D (CHOLECALCIFEROL) PO Take by mouth  daily  at 6 (six) AM.         ALLERGIES: Allergies  Allergen Reactions   Atorvastatin     Other reaction(s): Muscle Pain   Empagliflozin     Other reaction(s): yeast   Penicillins Itching    hives   Sulfa Antibiotics Itching    rash     REVIEW OF SYSTEMS: A comprehensive ROS was conducted with the patient and is negative except as per HPI     OBJECTIVE:   VITAL SIGNS: BP 122/80 (BP Location: Left Arm, Patient Position: Sitting, Cuff Size: Normal)   Pulse 71   Ht 5\' 2"  (1.575 m)   Wt 146 lb (66.2 kg)   SpO2 97%   BMI 26.70 kg/m    Filed Weights   10/23/23 1053  Weight: 146 lb (66.2 kg)     PHYSICAL EXAM:  General: Pt appears well and is in NAD  Neck: General: Supple without adenopathy or carotid bruits. Thyroid : Thyroid  size normal.  No goiter or nodules appreciated.   Lungs: Clear with good BS bilat with no rales, rhonchi, or wheezes  Heart: RRR   Extremities:  Lower extremities - No pretibial edema.   Neuro: MS is good with appropriate affect, pt is alert and Ox3    DM foot exam: 10/23/2023  The skin of the feet is intact without sores or ulcerations. The pedal pulses are 2+ on right and 2+ on left. The sensation is decreased  to a screening 5.07, 10 gram monofilament at the great toe   DATA REVIEWED:  Lab Results  Component Value Date   HGBA1C 6.6 (A) 10/23/2023   HGBA1C 6.2 (A) 06/03/2023   HGBA1C 7.4 10/24/2022     Latest Reference Range & Units 10/11/23 13:18  Sodium 135 - 145 mmol/L 138  Potassium 3.5 - 5.1 mmol/L 4.8  Chloride 98 - 111 mmol/L 104  CO2 22 - 32 mmol/L 23  Glucose 70 - 99 mg/dL 098 (H)  BUN 8 - 23 mg/dL 13  Creatinine 1.19 - 1.47 mg/dL 8.29  Calcium 8.9 - 56.2 mg/dL 9.6  Anion gap 5 - 15  11  Alkaline Phosphatase 38 - 126 U/L 91  Albumin  3.5 - 5.0 g/dL 4.1  AST 15 - 41 U/L 21  ALT 0 - 44 U/L 14  Total Protein 6.5 - 8.1 g/dL 7.8  Total Bilirubin 0.0 - 1.2 mg/dL 0.4  GFR, Estimated >13 mL/min >60      ASSESSMENT  / PLAN / RECOMMENDATIONS:   1) Type 2 Diabetes Mellitus,  Optimally controlled, With neuropathic  complications - Most recent A1c of 6.6 %. Goal A1c < 7.0 %.    -Historically she has endorsed chronic diarrhea that she attributed to metformin , after reducing metformin  by 50%, she has noted dramatic improvement to the diarrhea - Has intolerance to Jardiance due to recurrent yeast infections -She is tolerating Ozempic , we have opted to remain on the current dose - In reviewing CGM download, patient has been noted with hypoglycemia during the day, will decrease morning dose of insulin    MEDICATIONS: Continue metformin  500 mg XR 1  tab BID  Continue Ozempic  0.5 mg weekly Change Novolin  Mix to 26 units before breakfast and 10 units before supper   EDUCATION / INSTRUCTIONS: BG monitoring instructions: Patient is instructed to check her blood sugars 2 times a day. Call [facility] if: BG persistently < 70  I reviewed the Rule of 15 for the treatment of hypoglycemia in detail with the patient.  Literature supplied.   2) Diabetic complications:  Eye: Does not have known diabetic retinopathy.  Neuro/ Feet: Does  have known diabetic peripheral neuropathy. Renal: Patient does not have known baseline CKD. She is not on an ACEI/ARB at present.   3) Secondary Adrenal Insufficiency:   -She has been on chronic glucocorticoid therapy for asthma-COPD overlap -She has been on prednisone  5 mg since September 2024 - I switched from prednisone  to hydrocortisone  in 05/2023 -ACTH  has been normal - Patient will return for a fasting, 8 AM cortisol while holding hydrocortisone  dose the afternoon prior to the test day as well as the morning of that test day.  She was advised to bring her hydrocortisone  the day of the test so she can take it immediately after labs are drawn  Medication hydrocortisone  10 mg, 1.5 tablets with breakfast and half a tablet in the afternoon between 2-4 PM  Signed  electronically by: Natale Bail, MD  [facility]  [facility] [address] [place] [place], [place] [address] Phone: [phone] FAX: [phone]   CC: Tasia Farr [address] [place] [address] Phone: [phone]  Fax: [phone]    Return to Endocrinology clinic as below: Future Appointments  Date Time Provider Department Center  11/22/2023  2:00 PM Ambs, Jeanmarie Millet, FNP [facility] None  12/10/2023 10:45 AM Patterson Bora, NP [facility] None  02/26/2024 11:15 AM Rochester Chuck, MD [facility] None

## 2023-10-24 ENCOUNTER — Encounter: Payer: Self-pay | Admitting: Internal Medicine

## 2023-10-28 ENCOUNTER — Other Ambulatory Visit (HOSPITAL_COMMUNITY)

## 2023-11-01 ENCOUNTER — Other Ambulatory Visit

## 2023-11-01 DIAGNOSIS — E2749 Other adrenocortical insufficiency: Secondary | ICD-10-CM | POA: Diagnosis not present

## 2023-11-04 DIAGNOSIS — G8929 Other chronic pain: Secondary | ICD-10-CM | POA: Diagnosis not present

## 2023-11-04 DIAGNOSIS — M5441 Lumbago with sciatica, right side: Secondary | ICD-10-CM | POA: Diagnosis not present

## 2023-11-04 DIAGNOSIS — D7289 Other specified disorders of white blood cells: Secondary | ICD-10-CM | POA: Diagnosis not present

## 2023-11-04 DIAGNOSIS — Z1329 Encounter for screening for other suspected endocrine disorder: Secondary | ICD-10-CM | POA: Diagnosis not present

## 2023-11-04 DIAGNOSIS — E1165 Type 2 diabetes mellitus with hyperglycemia: Secondary | ICD-10-CM | POA: Diagnosis not present

## 2023-11-04 DIAGNOSIS — I10 Essential (primary) hypertension: Secondary | ICD-10-CM | POA: Diagnosis not present

## 2023-11-04 DIAGNOSIS — E782 Mixed hyperlipidemia: Secondary | ICD-10-CM | POA: Diagnosis not present

## 2023-11-04 DIAGNOSIS — E7849 Other hyperlipidemia: Secondary | ICD-10-CM | POA: Diagnosis not present

## 2023-11-04 DIAGNOSIS — M25511 Pain in right shoulder: Secondary | ICD-10-CM | POA: Diagnosis not present

## 2023-11-04 DIAGNOSIS — M5030 Other cervical disc degeneration, unspecified cervical region: Secondary | ICD-10-CM | POA: Diagnosis not present

## 2023-11-04 DIAGNOSIS — M1711 Unilateral primary osteoarthritis, right knee: Secondary | ICD-10-CM | POA: Diagnosis not present

## 2023-11-04 DIAGNOSIS — M5442 Lumbago with sciatica, left side: Secondary | ICD-10-CM | POA: Diagnosis not present

## 2023-11-05 ENCOUNTER — Ambulatory Visit: Payer: Self-pay | Admitting: Internal Medicine

## 2023-11-05 LAB — CORTISOL: Cortisol, Plasma: 5.5 ug/dL

## 2023-11-05 LAB — ACTH: C206 ACTH: 36 pg/mL (ref 6–50)

## 2023-11-07 ENCOUNTER — Ambulatory Visit (INDEPENDENT_AMBULATORY_CARE_PROVIDER_SITE_OTHER): Payer: Medicare HMO | Admitting: Gastroenterology

## 2023-11-07 DIAGNOSIS — M25562 Pain in left knee: Secondary | ICD-10-CM | POA: Diagnosis not present

## 2023-11-07 DIAGNOSIS — M170 Bilateral primary osteoarthritis of knee: Secondary | ICD-10-CM | POA: Diagnosis not present

## 2023-11-07 DIAGNOSIS — M545 Low back pain, unspecified: Secondary | ICD-10-CM | POA: Diagnosis not present

## 2023-11-07 DIAGNOSIS — M47812 Spondylosis without myelopathy or radiculopathy, cervical region: Secondary | ICD-10-CM | POA: Diagnosis not present

## 2023-11-07 DIAGNOSIS — M25561 Pain in right knee: Secondary | ICD-10-CM | POA: Diagnosis not present

## 2023-11-07 DIAGNOSIS — R269 Unspecified abnormalities of gait and mobility: Secondary | ICD-10-CM | POA: Diagnosis not present

## 2023-11-07 DIAGNOSIS — R29898 Other symptoms and signs involving the musculoskeletal system: Secondary | ICD-10-CM | POA: Diagnosis not present

## 2023-11-07 DIAGNOSIS — M542 Cervicalgia: Secondary | ICD-10-CM | POA: Diagnosis not present

## 2023-11-07 DIAGNOSIS — M47816 Spondylosis without myelopathy or radiculopathy, lumbar region: Secondary | ICD-10-CM | POA: Diagnosis not present

## 2023-11-07 DIAGNOSIS — M436 Torticollis: Secondary | ICD-10-CM | POA: Diagnosis not present

## 2023-11-07 DIAGNOSIS — Z9181 History of falling: Secondary | ICD-10-CM | POA: Diagnosis not present

## 2023-11-07 DIAGNOSIS — G8929 Other chronic pain: Secondary | ICD-10-CM | POA: Diagnosis not present

## 2023-11-13 ENCOUNTER — Other Ambulatory Visit (INDEPENDENT_AMBULATORY_CARE_PROVIDER_SITE_OTHER): Payer: Self-pay | Admitting: Gastroenterology

## 2023-11-14 DIAGNOSIS — M47812 Spondylosis without myelopathy or radiculopathy, cervical region: Secondary | ICD-10-CM | POA: Diagnosis not present

## 2023-11-14 DIAGNOSIS — M170 Bilateral primary osteoarthritis of knee: Secondary | ICD-10-CM | POA: Diagnosis not present

## 2023-11-14 DIAGNOSIS — M47816 Spondylosis without myelopathy or radiculopathy, lumbar region: Secondary | ICD-10-CM | POA: Diagnosis not present

## 2023-11-20 DIAGNOSIS — I10 Essential (primary) hypertension: Secondary | ICD-10-CM | POA: Diagnosis not present

## 2023-11-20 DIAGNOSIS — Z6824 Body mass index (BMI) 24.0-24.9, adult: Secondary | ICD-10-CM | POA: Diagnosis not present

## 2023-11-20 DIAGNOSIS — E782 Mixed hyperlipidemia: Secondary | ICD-10-CM | POA: Diagnosis not present

## 2023-11-20 DIAGNOSIS — F411 Generalized anxiety disorder: Secondary | ICD-10-CM | POA: Diagnosis not present

## 2023-11-20 DIAGNOSIS — E1165 Type 2 diabetes mellitus with hyperglycemia: Secondary | ICD-10-CM | POA: Diagnosis not present

## 2023-11-20 DIAGNOSIS — E7849 Other hyperlipidemia: Secondary | ICD-10-CM | POA: Diagnosis not present

## 2023-11-22 ENCOUNTER — Ambulatory Visit: Admitting: Family Medicine

## 2023-11-22 NOTE — Patient Instructions (Incomplete)
 Asthma COPD overlap syndrome Continue Breztri 2 puffs twice a day with a spacer to prevent cough or wheeze Continue albuterol 2 puffs every 4 hours as needed for cough or wheeze OR Instead use albuterol 0.083% solution via nebulizer one unit vial every 4 hours as needed for cough or wheeze You may use albuterol 2 puffs 5 to 15 minutes before activity to decrease cough or wheeze Continue Dupixent injections 300 mg once every 14 days for asthma control For now and for asthma flare, begin Flovent 110-2 puffs once a day for 1-2 weeks or until cough and wheeze free If no improvement with your breathing we can evaluate Dupixent efficacy at your follow up visit  Chronic rhinitis Continue allergen avoidance measures directed toward mold as listed below Continue loratadine 10 mg once a day as needed for runny nose or itch Continue Atrovent 2 sprays in each nostril twice a day as needed for runny nose Begin Ryaltris 2 sprays in each nostril twice a day fir nasal symtpoms  Nasal polyposis Begin a steroid spray daily- Flonase, Nasacort or Nasonex. Continue Dupixent once every other week  Reflux Continue dietary and lifestyle modifications as listed below Continue to follow up with your gastroenterologist as recommended  Call the clinic if this treatment plan is not working well for you.  Follow up in 2 months or sooner if needed.  Control of Mold Allergen Mold and fungi can grow on a variety of surfaces provided certain temperature and moisture conditions exist.  Outdoor molds grow on plants, decaying vegetation and soil.  The major outdoor mold, Alternaria and Cladosporium, are found in very high numbers during hot and dry conditions.  Generally, a late Summer - Fall peak is seen for common outdoor fungal spores.  Rain will temporarily lower outdoor mold spore count, but counts rise rapidly when the rainy period ends.  The most important indoor molds are Aspergillus and Penicillium.  Dark, humid and  poorly ventilated basements are ideal sites for mold growth.  The next most common sites of mold growth are the bathroom and the kitchen.  Outdoor [employer] Use air conditioning and keep windows closed Avoid exposure to decaying vegetation. Avoid leaf raking. Avoid grain handling. Consider wearing a face mask if working in moldy areas.  Indoor Mold Control Maintain humidity below 50%. Clean washable surfaces with 5% bleach solution. Remove sources e.g. Contaminated carpets.

## 2023-11-22 NOTE — Progress Notes (Deleted)
   [address] [place] [address] Dept: [phone]  FOLLOW UP NOTE  Patient ID: Emily Humphrey, female    DOB: 03-05-1951  Age: 73 y.o. MRN: [id number] Date of Office Visit: 11/22/2023  Assessment  Chief Complaint: No chief complaint on file.  HPI Emily Humphrey is a 73 year old female who presents to the clinic for follow-up visit.  She was last seen in this clinic on 09/20/2023 by Marinus Sic, FNP, for evaluation of not well-controlled asthma, chronic rhinitis, nasal polyposis, and reflux.  Her last environmental allergy skin testing had a nonreactive histamine.  Her last environmental allergy testing via lab on 09/13/2021 was slightly positive to mold.  Discussed the use of AI scribe software for clinical note transcription with the patient, who gave verbal consent to proceed.  History of Present Illness      Drug Allergies:  Allergies  Allergen Reactions   Atorvastatin     Other reaction(s): Muscle Pain   Empagliflozin     Other reaction(s): yeast   Penicillins Itching    hives   Sulfa Antibiotics Itching    rash    Physical Exam: There were no vitals taken for this visit.   Physical Exam  Diagnostics:    Assessment and Plan: No diagnosis found.  No orders of the defined types were placed in this encounter.   There are no Patient Instructions on file for this visit.  No follow-ups on file.    Thank you for the opportunity to care for this patient.  Please do not hesitate to contact me with questions.  Marinus Sic, FNP [facility]     '

## 2023-11-25 DIAGNOSIS — M1711 Unilateral primary osteoarthritis, right knee: Secondary | ICD-10-CM | POA: Diagnosis not present

## 2023-12-09 DIAGNOSIS — M436 Torticollis: Secondary | ICD-10-CM | POA: Diagnosis not present

## 2023-12-09 DIAGNOSIS — M170 Bilateral primary osteoarthritis of knee: Secondary | ICD-10-CM | POA: Diagnosis not present

## 2023-12-09 DIAGNOSIS — Z9181 History of falling: Secondary | ICD-10-CM | POA: Diagnosis not present

## 2023-12-09 DIAGNOSIS — M25561 Pain in right knee: Secondary | ICD-10-CM | POA: Diagnosis not present

## 2023-12-09 DIAGNOSIS — R29898 Other symptoms and signs involving the musculoskeletal system: Secondary | ICD-10-CM | POA: Diagnosis not present

## 2023-12-09 DIAGNOSIS — R269 Unspecified abnormalities of gait and mobility: Secondary | ICD-10-CM | POA: Diagnosis not present

## 2023-12-09 DIAGNOSIS — M47816 Spondylosis without myelopathy or radiculopathy, lumbar region: Secondary | ICD-10-CM | POA: Diagnosis not present

## 2023-12-09 DIAGNOSIS — M545 Low back pain, unspecified: Secondary | ICD-10-CM | POA: Diagnosis not present

## 2023-12-09 DIAGNOSIS — M542 Cervicalgia: Secondary | ICD-10-CM | POA: Diagnosis not present

## 2023-12-09 DIAGNOSIS — G8929 Other chronic pain: Secondary | ICD-10-CM | POA: Diagnosis not present

## 2023-12-09 DIAGNOSIS — M25562 Pain in left knee: Secondary | ICD-10-CM | POA: Diagnosis not present

## 2023-12-09 DIAGNOSIS — M47812 Spondylosis without myelopathy or radiculopathy, cervical region: Secondary | ICD-10-CM | POA: Diagnosis not present

## 2023-12-10 ENCOUNTER — Encounter (INDEPENDENT_AMBULATORY_CARE_PROVIDER_SITE_OTHER): Payer: Self-pay | Admitting: Gastroenterology

## 2023-12-10 ENCOUNTER — Ambulatory Visit (INDEPENDENT_AMBULATORY_CARE_PROVIDER_SITE_OTHER): Admitting: Gastroenterology

## 2023-12-10 VITALS — BP 105/65 | HR 84 | Temp 98.1°F | Ht 62.0 in | Wt 142.3 lb

## 2023-12-10 DIAGNOSIS — R1012 Left upper quadrant pain: Secondary | ICD-10-CM

## 2023-12-10 DIAGNOSIS — Z8719 Personal history of other diseases of the digestive system: Secondary | ICD-10-CM | POA: Diagnosis not present

## 2023-12-10 DIAGNOSIS — K581 Irritable bowel syndrome with constipation: Secondary | ICD-10-CM | POA: Diagnosis not present

## 2023-12-10 DIAGNOSIS — K219 Gastro-esophageal reflux disease without esophagitis: Secondary | ICD-10-CM

## 2023-12-10 DIAGNOSIS — K5732 Diverticulitis of large intestine without perforation or abscess without bleeding: Secondary | ICD-10-CM | POA: Insufficient documentation

## 2023-12-10 MED ORDER — FAMOTIDINE 20 MG PO TABS: 20.0000 mg | ORAL_TABLET | Freq: Every day | ORAL | 3 refills | Status: AC

## 2023-12-10 NOTE — Patient Instructions (Signed)
 We will continue amitiza  8mcg twice daily Continue omeprazole  40mg  daily Resume famotidine  20mg  in the evenings Continue to utilize low FODMAP guide Avoid greasy, spicy, fried, citrus foods, and be mindful that caffeine, carbonated drinks, chocolate and alcohol  can increase reflux symptoms Stay upright 2-3 hours after eating, prior to lying down and avoid eating late in the evenings.  Follow up 6 months  It was a pleasure to see you today. I want to create trusting relationships with patients and provide genuine, compassionate, and quality care. I truly value your feedback! please be on the lookout for a survey regarding your visit with me today. I appreciate your input about our visit and your time in completing this!    Emily Humphrey L. Emily Hebner, MSN, APRN, AGNP-C Adult-Gerontology Nurse Practitioner [facility]

## 2023-12-10 NOTE — Progress Notes (Signed)
 Referring Provider: Dow Longs, PA-C Primary Care Physician:  Dow Longs, PA-C Primary GI Physician: Dr. Cinderella   Chief Complaint  Patient presents with   Follow-up    Pt arrives for follow up. Pt still having stomach issues but has no worsened. Pt is wanting to know if she diverticulitis or diverticulosis. Pt is following IBS diet and is wanting to know if there is any special food she needs to avoid. Pt had CT scan completed in April.   HPI:   Emily Humphrey is a 73 y.o. female with past medical history of asthma, COPD, dyslipidemia, GERD, DM, OSA, IBS   Patient presenting today for follow up of: GERD IBS withConstipation  Diverticulitis   Last seen march, at that time, having continued upper abdominal pain. Some weight loss but on ozempic . Taking protonix 40mg  daily, famotidine  20mg  at bedtime, some reflux symptoms maybe twice per week. Doing amitiza  8mcg BID, having a BM every other day to every day   Recommended to schedule CT A/P with contrast, stop protonix, start omeprazole  40mg  daily, continue famotidine  20mg  at bedtime, continue amitiza  8mcg BID  CT with findings of acute diverticulitis, sent cipro  and flagyl  x10 days  Present: Doing better since completing antibiotics for diverticulitis in April. She continues to have some LUQ pain, depending on what she eats. Cannot eat spicy foods. Having a BM daily to every other day on amitiza  8mcg BID. She is trying to follow low FODMAP diet as best she can which does help her symptoms. Appetite is down some and she has lost some weight on ozempic . She feels that her appetite decreased since going on ozempic . A1c is much better. No nausea or vomiting. No rectal bleeding or melena. She is doing omeprazole  40mg  daily, she is not taking famotidine  in the evenings currently. Sometimes may have GERD symptoms with some heartburn maybe 1-2 times per week at the most, she will take tums on occasion. No dysphagia, odynophagia, early satiety.     CT A/P w/contrast 10/11/23: Findings suggestive of an early acute diverticulitis in the proximal descending colon. No peridiverticular abscess or pneumoperitoneum. Last Colonoscopy: 2/2025One 5 mm polyp at the ileocecal valve, removed                            with a cold snare. Resected and retrieved.                           - Two 3 to 5 mm polyps in the transverse colon and                            in the ascending colon, removed with a cold snare.                            Resected and retrieved.                           - Diverticulosis in the left colon.                           - Non-bleeding external and internal hemorrhoids.                           -  The examined portion of the ileum was normal. Last EGD: 07/2023- Normal esophagus.                           - Gastritis. Biopsied.                           - Normal duodenal bulb and second portion of the                            duodenum. Biopsied.   SMALL BOWEL, BIOPSY:  Duodenal mucosa with normal villous architecture.  No villous atrophy or increased intraepithelial lymphocytes.   B. STOMACH, BIOPSY:  Unremarkable gastric antral and oxyntic mucosa.  Negative for Helicobacter pylori.   C. COLON, ILEOCECAL VALVE, ASCENDING, TREANSVERSE, POLYPECTOMY:  Tubular adenoma (s) without high grade dysplasia.      Recommendations:  Repeat TCS in 5 years   Past Medical History:  Diagnosis Date   Abnormal gait    Asthma    Chest pain    Hospital, May, 2012,  Pericarditis   COPD (chronic obstructive pulmonary disease) (HCC)    Chronic steroid use   Dyslipidemia    Ejection fraction    Normal, echo, [id number]   GERD (gastroesophageal reflux disease)    History of tobacco abuse    IDDM (insulin  dependent diabetes mellitus)    Morbid obesity (HCC)    OSA (obstructive sleep apnea)    mild/not using C-PAP    Pericardial effusion    Small, echo, circumferential, May, 2012   Pericarditis    Hospitalization, May,  2012   Pneumonia 2010   Tremor     Past Surgical History:  Procedure Laterality Date   ABDOMINAL HYSTERECTOMY     BIOPSY  08/13/2023   Procedure: BIOPSY;  Surgeon: Cinderella Deatrice FALCON, MD;  [facility];  Service: Endoscopy;;   COLONOSCOPY WITH PROPOFOL  N/A 08/13/2023   Procedure: COLONOSCOPY WITH PROPOFOL ;  Surgeon: Cinderella Deatrice FALCON, MD;  Location: [facility];  Service: Endoscopy;  Laterality: N/A;  9:00AM;ASA 2   ESOPHAGOGASTRODUODENOSCOPY (EGD) WITH PROPOFOL  N/A 08/13/2023   Procedure: ESOPHAGOGASTRODUODENOSCOPY (EGD) WITH PROPOFOL ;  Surgeon: Cinderella Deatrice FALCON, MD;  Location: [facility];  Service: Endoscopy;  Laterality: N/A;  9:00AM;ASA 2   NASAL SINUS SURGERY     POLYPECTOMY  08/13/2023   Procedure: POLYPECTOMY;  Surgeon: Cinderella Deatrice FALCON, MD;  Location: [facility];  Service: Endoscopy;;   Removal of throat nodules     vocal cored nodules    Current Outpatient Medications  Medication Sig Dispense Refill   ACCU-CHEK AVIVA PLUS test strip      albuterol  (PROVENTIL ) (2.5 MG/3ML) 0.083% nebulizer solution Take 3 mLs (2.5 mg total) by nebulization every 4 (four) hours as needed for wheezing or shortness of breath. 75 mL 0   albuterol  (VENTOLIN  HFA) 108 (90 Base) MCG/ACT inhaler Inhale 2 puffs into the lungs every 4 (four) hours as needed. 17 each 6   alendronate (FOSAMAX) 70 MG tablet Take 70 mg by mouth.     ALPRAZolam (XANAX) 0.25 MG tablet Take 0.25 mg by mouth daily as needed.     azelastine  (ASTELIN ) 0.1 % nasal spray Place 2 sprays into both nostrils 2 (two) times daily. Use in each nostril as directed 30 mL 5   Azelastine -Fluticasone  137-50 MCG/ACT SUSP 2 sprays in each nostril up to twice  a day if needed for nasal symtpoms. 23 g 3   Budeson-Glycopyrrol-Formoterol (BREZTRI  AEROSPHERE) 160-9-4.8 MCG/ACT AERO Inhale 2 puffs into the lungs in the morning and at bedtime. (Patient taking differently: Inhale 2 puffs into the lungs in the morning.) 10.7 g 11   CALCIUM  PO Take 1 tablet by mouth daily.     celecoxib (CELEBREX) 200 MG capsule Take by mouth. (Patient taking differently: Take 100 mg by mouth.)     Continuous Glucose Receiver (FREESTYLE LIBRE 14 DAY READER) DEVI by Does not apply route.     Continuous Glucose Sensor (FREESTYLE LIBRE 2 SENSOR) MISC by Does not apply route.     Continuous Glucose Sensor (FREESTYLE LIBRE 3 SENSOR) MISC Change sensor every 14 days 6 each 3   donepezil  (ARICEPT ) 10 MG tablet Take 1 tablet (10 mg total) by mouth at bedtime. 30 tablet 11   dupilumab  (DUPIXENT ) 300 MG/2ML prefilled syringe Inject 300 mg into the skin every 14 (fourteen) days. 4 mL 11   famotidine  (PEPCID ) 20 MG tablet Take 1 tablet (20 mg total) by mouth daily. 30 tablet 5   fluticasone  (FLONASE ) 50 MCG/ACT nasal spray Place 2 sprays into both nostrils 2 (two) times daily. 16 g 5   gabapentin (NEURONTIN) 600 MG tablet Take 600 mg by mouth as needed.     HYDROcodone-acetaminophen (NORCO/VICODIN) 5-325 MG tablet Take 1 tablet by mouth every 6 (six) hours as needed.     hydrocortisone  (CORTEF ) 10 MG tablet Take 1 tablet (10 mg total) by mouth as directed. 1.5 tab QAM and half a tab between 2-4 pm 200 tablet 3   insulin  isophane & regular human KwikPen (NOVOLIN  70/30 KWIKPEN) (70-30) 100 UNIT/ML KwikPen Inject 36 Units into the skin daily before breakfast AND 10 Units daily before supper. 45 mL 4   Insulin  Pen Needle 32G X 4 MM MISC 1 Device by Does not apply route in the morning and at bedtime. 200 each 3   ipratropium (ATROVENT ) 0.03 % nasal spray Place one spray per nostril daily (can use up to 3 times daily if needed, but this can be over drying) 30 mL 5   lubiprostone  (AMITIZA ) 8 MCG capsule Take 1 capsule (8 mcg total) by mouth 2 (two) times daily with a meal. 180 capsule 3   metFORMIN  (GLUCOPHAGE -XR) 500 MG 24 hr tablet Take 1 tablet (500 mg total) by mouth 2 (two) times daily. 180 tablet 2   Olopatadine-Mometasone (RYALTRIS ) 665-25 MCG/ACT SUSP Place 2  sprays into the nose in the morning and at bedtime. 29 g 5   omeprazole  (PRILOSEC) 40 MG capsule TAKE 1 CAPSULE (40 MG TOTAL) BY MOUTH DAILY. 60 capsule 1   Potassium 99 MG TABS Take 1 tablet by mouth daily.     Probiotic Product (PROBIOTIC DAILY PO) Take by mouth. One daily     Semaglutide ,0.25 or 0.5MG /DOS, 2 MG/3ML SOPN Inject 0.5 mg into the skin once a week. 3 mL 3   simvastatin (ZOCOR) 20 MG tablet Take 20 mg by mouth daily.     traZODone (DESYREL) 100 MG tablet Take 100 mg by mouth.     venlafaxine XR (EFFEXOR-XR) 75 MG 24 hr capsule Take 75 mg by mouth daily.     VITAMIN D, CHOLECALCIFEROL, PO Take by mouth daily at 6 (six) AM.     Current Facility-Administered Medications  Medication Dose Route Frequency Provider Last Rate Last Admin   dupilumab  (DUPIXENT ) prefilled syringe 300 mg  300 mg Subcutaneous Q14 Days  Cari Arlean HERO, FNP   300 mg at 11/06/21 1717    Allergies as of 12/10/2023 - Review Complete 12/10/2023  Allergen Reaction Noted   Atorvastatin  01/22/2017   Empagliflozin  07/06/2021   Penicillins Itching 12/13/2010   Sulfa antibiotics Itching 12/26/2010    Social History   Socioeconomic History   Marital status: Divorced    Spouse name: Not on file   Number of children: 2   Years of education: 12   Highest education level: Not on file  Occupational History   Occupation: RETIRED    Comment: 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   Caffeine 1 c daily   Social Drivers of Corporate investment banker Strain: Low Risk  (11/11/2023)   Received from [facility]   Overall Financial Resource Strain (CARDIA)    Difficulty of Paying Living Expenses: Not hard at all  Food Insecurity: No Food Insecurity (11/11/2023)   Received from [facility]   Hunger Vital Sign    Within the past 12 months, you worried that your food would run out before you got the money to buy more.: Never true    Within the past 12 months, the food you bought just didn't last and you didn't have money to get more.: Never true  Transportation Needs: No Transportation Needs (11/11/2023)   Received from [facility] - Transportation    Lack of Transportation (Medical): No    Lack of Transportation (Non-Medical): No  Physical Activity: Unknown (11/11/2023)   Received from [facility]   Exercise Vital Sign    On average, how many days per week do you engage in moderate to strenuous exercise (like a brisk walk)?: 0 days    Minutes of Exercise per Session: Not on file  Stress: Stress Concern Present (11/11/2023)   Received from [facility] Occupational [facility] - Occupational Stress Questionnaire    Feeling of Stress : To some extent  Social Connections: Somewhat Isolated (11/11/2023)   Received from [facility]   Social Network    How would you rate your social network (family, work, friends)?: Restricted participation with some degree of social isolation    Review of systems General: negative for malaise, night sweats, fever, chills, weight loss Neck: Negative for lumps, goiter, pain and significant neck swelling Resp: Negative for cough, wheezing, dyspnea at rest CV: Negative for chest pain, leg swelling, palpitations, orthopnea GI: denies melena, hematochezia, nausea, vomiting, diarrhea, constipation, dysphagia, odyonophagia, early satiety or unintentional  weight loss. +LUQ pain +heartburn  MSK: Negative for joint pain or swelling, back pain, and muscle pain. Derm: Negative for itching or rash Psych: Denies depression, anxiety, memory loss, confusion. No homicidal or suicidal ideation.  Heme:  Negative for prolonged bleeding, bruising easily, and swollen nodes. Endocrine: Negative for cold or heat intolerance, polyuria, polydipsia and goiter. Neuro: negative for tremor, gait imbalance, syncope and seizures. The remainder of the review of systems is noncontributory.  Physical Exam: BP 105/65   Pulse 84   Temp 98.1 F (36.7 C)   Ht 5' 2 (1.575 m)   Wt 142 lb 4.8 oz (64.5 kg)   BMI 26.03 kg/m  General:   Alert and oriented. No distress noted. Pleasant and cooperative.  Head:  Normocephalic and atraumatic. Eyes:  Conjuctiva clear without scleral icterus. Mouth:  Oral mucosa pink and moist. Good dentition. No lesions. Heart: Normal rate and rhythm, s1 and s2 heart sounds present.  Lungs: Clear lung sounds in all lobes. Respirations equal and unlabored. Abdomen:  +BS, soft, non-tender and non-distended. No rebound or guarding. No HSM or masses noted. Derm: No palmar erythema or jaundice Msk:  Symmetrical without gross deformities. Normal posture. Extremities:  Without edema. Neurologic:  Alert and  oriented x4 Psych:  Alert and cooperative. Normal mood and affect.  Invalid input(s): 6 MONTHS   ASSESSMENT: Emily Humphrey is a 73 y.o. female presenting today for follow up of GERD, IBS with constipation and recent diverticulitis  GERD: better since switching to omeprazole  40mg  daily. Not taking famotidine  at this time. Reports breakthrough maybe 1-2 times per week, takes tums PRN. Recent EGD as outlined above with gastritis which may be contributing to her LUQ pain. Recommend resuming famotidine  20mg  at bedtime, continue omeprazole  40mg  daily, good reflux precautions as she is doing.   IBS with constipation: constipation well controlled on  amitiza  8mcg daily with a BM daily to every other day. Trying to adhere to low FODMAP diet which seems to help with her symptoms. Will continue with current bowel regimen, low FODMAP diet at this time.   Recent diverticulitis: acute, uncomplicated diverticulitis on CT imaging in April, treated with cipro  and flagyl  x10 days. She is feeling better in regards to this. Had recent Colonoscopy in February, therefore no need to repeat at this time.    PLAN:  -continue omeprazole  40mg   -resume famotidine  20mg  daily -continue amitiza  8mcg BID  -low FODMAP diet -good reflux precautions -Increase water  intake, aim for atleast 64 oz per day -Increase fruits, veggies and whole grains, kiwi and prunes are especially good for constipation  All questions were answered, patient verbalized understanding and is in agreement with plan as outlined above.   Follow Up: 6 months   Emily Montalvo L. Amauris Debois, MSN, APRN, AGNP-C Adult-Gerontology Nurse Practitioner [facility]

## 2023-12-11 DIAGNOSIS — Z1231 Encounter for screening mammogram for malignant neoplasm of breast: Secondary | ICD-10-CM | POA: Diagnosis not present

## 2023-12-16 DIAGNOSIS — E119 Type 2 diabetes mellitus without complications: Secondary | ICD-10-CM | POA: Diagnosis not present

## 2023-12-16 DIAGNOSIS — H0102A Squamous blepharitis right eye, upper and lower eyelids: Secondary | ICD-10-CM | POA: Diagnosis not present

## 2023-12-16 DIAGNOSIS — H0102B Squamous blepharitis left eye, upper and lower eyelids: Secondary | ICD-10-CM | POA: Diagnosis not present

## 2023-12-16 DIAGNOSIS — H11823 Conjunctivochalasis, bilateral: Secondary | ICD-10-CM | POA: Diagnosis not present

## 2023-12-16 DIAGNOSIS — H16211 Exposure keratoconjunctivitis, right eye: Secondary | ICD-10-CM | POA: Diagnosis not present

## 2023-12-16 DIAGNOSIS — H40013 Open angle with borderline findings, low risk, bilateral: Secondary | ICD-10-CM | POA: Diagnosis not present

## 2023-12-16 DIAGNOSIS — H02831 Dermatochalasis of right upper eyelid: Secondary | ICD-10-CM | POA: Diagnosis not present

## 2023-12-16 DIAGNOSIS — H43812 Vitreous degeneration, left eye: Secondary | ICD-10-CM | POA: Diagnosis not present

## 2023-12-16 DIAGNOSIS — L2089 Other atopic dermatitis: Secondary | ICD-10-CM | POA: Diagnosis not present

## 2023-12-26 ENCOUNTER — Telehealth (INDEPENDENT_AMBULATORY_CARE_PROVIDER_SITE_OTHER): Payer: Self-pay

## 2023-12-26 NOTE — Telephone Encounter (Signed)
 Patient called today stating she was in significant pain in the LUQ area right under her ribs and she was debating on going to the Ed. I spoke with her and she says the pain started last night, got better and now back to hurting pretty severely. She has had pain in this area in the past, but says this is more severe. She says she is also staying nauseated. Denies any Vomiting or fevers. I asked her if she felt she needed to go to the Ed she said yes, I told her that would be a good idea, especially if she thinks this is worsening and not like the pain she has had in the past. I made her aware we had no availability for an appointment today and she should be evaluated sooner rather than later. She says she will go to the Ed now.

## 2023-12-31 ENCOUNTER — Ambulatory Visit (INDEPENDENT_AMBULATORY_CARE_PROVIDER_SITE_OTHER): Payer: Self-pay | Admitting: Gastroenterology

## 2023-12-31 ENCOUNTER — Ambulatory Visit (HOSPITAL_COMMUNITY)
Admission: RE | Admit: 2023-12-31 | Discharge: 2023-12-31 | Disposition: A | Discharge status: Home / Self Care | Source: Ambulatory Visit | Attending: Gastroenterology | Admitting: Gastroenterology

## 2023-12-31 ENCOUNTER — Encounter (INDEPENDENT_AMBULATORY_CARE_PROVIDER_SITE_OTHER): Payer: Self-pay | Admitting: Gastroenterology

## 2023-12-31 ENCOUNTER — Ambulatory Visit (INDEPENDENT_AMBULATORY_CARE_PROVIDER_SITE_OTHER): Admitting: Gastroenterology

## 2023-12-31 VITALS — BP 126/66 | HR 90 | Temp 98.0°F | Ht 62.0 in | Wt 141.3 lb

## 2023-12-31 DIAGNOSIS — R1012 Left upper quadrant pain: Secondary | ICD-10-CM

## 2023-12-31 DIAGNOSIS — K581 Irritable bowel syndrome with constipation: Secondary | ICD-10-CM | POA: Diagnosis not present

## 2023-12-31 DIAGNOSIS — K5732 Diverticulitis of large intestine without perforation or abscess without bleeding: Secondary | ICD-10-CM | POA: Insufficient documentation

## 2023-12-31 DIAGNOSIS — Z8719 Personal history of other diseases of the digestive system: Secondary | ICD-10-CM

## 2023-12-31 DIAGNOSIS — K5792 Diverticulitis of intestine, part unspecified, without perforation or abscess without bleeding: Secondary | ICD-10-CM | POA: Diagnosis not present

## 2023-12-31 DIAGNOSIS — R109 Unspecified abdominal pain: Secondary | ICD-10-CM | POA: Diagnosis not present

## 2023-12-31 DIAGNOSIS — K5730 Diverticulosis of large intestine without perforation or abscess without bleeding: Secondary | ICD-10-CM | POA: Diagnosis not present

## 2023-12-31 LAB — POCT I-STAT CREATININE: Creatinine, Ser: 0.8 mg/dL (ref 0.44–1.00)

## 2023-12-31 MED ORDER — LUBIPROSTONE 24 MCG PO CAPS
24.0000 ug | ORAL_CAPSULE | Freq: Two times a day (BID) | ORAL | 1 refills | Status: AC
Start: 2023-12-31 — End: 2024-06-28

## 2023-12-31 MED ORDER — IOHEXOL 300 MG/ML  SOLN
100.0000 mL | Freq: Once | INTRAMUSCULAR | Status: AC | PRN
Start: 2023-12-31 — End: 2023-12-31
  Administered 2023-12-31: 100 mL via INTRAVENOUS

## 2023-12-31 NOTE — Patient Instructions (Signed)
 It was very nice to meet you today, as dicussed with will plan for the following :  1) increase Amitiza  to 24mcg twice daily  2) CT Abdomen and pelvis

## 2023-12-31 NOTE — Progress Notes (Signed)
 Hi Tanya ,  Can you please call the patient and tell the patient the CT scan did NOT show diverticulitis . This is good news  However I do see moderate amount of stool in the colon which can cause discomfort .  I would advice taking higher dose of Amitiza  as discussed , avoid any NSAIDs and discuss with PCP alternatives to ozempic    Please ask patient to start taking IB Guard 1-2 times daily   Thanks,  Lanis Storlie Faizan Earsel Shouse, MD Gastroenterology and Hepatology [facility] ===============  IMPRESSION: Sigmoid diverticulosis without evidence of diverticulitis.   Previous hysterectomy.   Otherwise normal.  No acute abnormality

## 2023-12-31 NOTE — Progress Notes (Signed)
 Emily Humphrey , M.D. Gastroenterology & Hepatology [facility] [address] [place], [place] [address] Primary Care Physician: Dow Longs, PA-C [address] [place] [address]  Chief Complaint: Abdominal pain  History of Present Illness: Emily Humphrey is a 73 y.o. female with past medical history of asthma, COPD, dyslipidemia, GERD, DM on ozempic  , OSA, IBS-C with recurrent abdominal pain  Patient was diagnosed with diverticulitis 09/2023 treated with p.o. antibiotics had improvement but again coming in with recurrent left upper abdominal pain.  Patient reports pain started 6 days ago left upper quadrant radiating to the back.  Initially was severe but now is slightly getting better but again relapses throughout the day.  Denies any fever or chills.Patient has done well on ozempic  in terms of getting A1c under control.Using amitiza  8mcg BID, having a BM every other day to every day   Patient denies using any NSAIDs and is only taking Tylenol for pain control  CT A/P w/contrast 10/11/23: Findings suggestive of an early acute diverticulitis in the proximal descending colon. No peridiverticular abscess or pneumoperitoneum.  Last Colonoscopy: 2/2025One 5 mm polyp at the ileocecal valve, removed                            with a cold snare. Resected and retrieved.                           - Two 3 to 5 mm polyps in the transverse colon and                            in the ascending colon, removed with a cold snare.                            Resected and retrieved.                           - Diverticulosis in the left colon.                           - Non-bleeding external and internal hemorrhoids.                           - The examined portion of the ileum was normal. Last EGD: 07/2023- Normal esophagus.                           - Gastritis. Biopsied.                           - Normal duodenal bulb and second portion of the                             duodenum. Biopsied.   SMALL BOWEL, BIOPSY:  Duodenal mucosa with normal villous architecture.  No villous atrophy or increased intraepithelial lymphocytes.   B. STOMACH, BIOPSY:  Unremarkable gastric antral and oxyntic mucosa.  Negative for Helicobacter pylori.   C. COLON, ILEOCECAL VALVE, ASCENDING, TREANSVERSE, POLYPECTOMY:  Tubular adenoma (s) without high grade dysplasia.      Recommendations:  Repeat TCS in 5 years  Past Medical History: Past Medical History:  Diagnosis Date   Abnormal gait    Asthma    Chest pain    Hospital, May, 2012,  Pericarditis   COPD (chronic obstructive pulmonary disease) (HCC)    Chronic steroid use   Dyslipidemia    Ejection fraction    Normal, echo, [id number]   GERD (gastroesophageal reflux disease)    History of tobacco abuse    IDDM (insulin  dependent diabetes mellitus)    Morbid obesity (HCC)    OSA (obstructive sleep apnea)    mild/not using C-PAP    Pericardial effusion    Small, echo, circumferential, May, 2012   Pericarditis    Hospitalization, May, 2012   Pneumonia 2010   Tremor     Past Surgical History: Past Surgical History:  Procedure Laterality Date   ABDOMINAL HYSTERECTOMY     BIOPSY  08/13/2023   Procedure: BIOPSY;  Surgeon: Emily Deatrice FALCON, MD;  [facility];  Service: Endoscopy;;   COLONOSCOPY WITH PROPOFOL  N/A 08/13/2023   Procedure: COLONOSCOPY WITH PROPOFOL ;  Surgeon: Emily Deatrice FALCON, MD;  [facility];  Service: Endoscopy;  Laterality: N/A;  9:00AM;ASA 2   ESOPHAGOGASTRODUODENOSCOPY (EGD) WITH PROPOFOL  N/A 08/13/2023   Procedure: ESOPHAGOGASTRODUODENOSCOPY (EGD) WITH PROPOFOL ;  Surgeon: Emily Deatrice FALCON, MD;  [facility];  Service: Endoscopy;  Laterality: N/A;  9:00AM;ASA 2   NASAL SINUS SURGERY     POLYPECTOMY  08/13/2023   Procedure: POLYPECTOMY;  Surgeon: Emily Deatrice FALCON, MD;  [facility]: [facility];  Service: Endoscopy;;   Removal of throat nodules      vocal cored nodules    Family History: Family History  Problem Relation Age of Onset   Heart attack Mother        deceased at age 60   Asthma Mother    Other Father        deceased at age 15   Diabetes Sister     Social History: Social History   Tobacco Use  Smoking Status Former   Current packs/day: 0.00   Average packs/day: 1 pack/day for 20.0 years (20.0 ttl pk-yrs)   Types: Cigarettes   Start date: 11/17/1974   Quit date: 11/17/1994   Years since quitting: 29.1   Passive exposure: Past  Smokeless Tobacco Never   Social History   Substance and Sexual Activity  Alcohol  Use Not Currently   Comment: drinks on rare occasions   Social History   Substance and Sexual Activity  Drug Use No    Allergies: Allergies  Allergen Reactions   Atorvastatin     Other reaction(s): Muscle Pain   Empagliflozin     Other reaction(s): yeast   Penicillins Itching    hives   Sulfa Antibiotics Itching    rash    Medications: Current Outpatient Medications  Medication Sig Dispense Refill   ACCU-CHEK AVIVA PLUS test strip      albuterol  (PROVENTIL ) (2.5 MG/3ML) 0.083% nebulizer solution Take 3 mLs (2.5 mg total) by nebulization every 4 (four) hours as needed for wheezing or shortness of breath. 75 mL 0   albuterol  (VENTOLIN  HFA) 108 (90 Base) MCG/ACT inhaler Inhale 2 puffs into the lungs every 4 (four) hours as needed. 17 each 6   alendronate (FOSAMAX) 70 MG tablet Take 70 mg by mouth.     ALPRAZolam (XANAX) 0.25 MG tablet Take 0.25 mg by mouth daily as needed.     azelastine  (ASTELIN ) 0.1 %  nasal spray Place 2 sprays into both nostrils 2 (two) times daily. Use in each nostril as directed 30 mL 5   Azelastine -Fluticasone  137-50 MCG/ACT SUSP 2 sprays in each nostril up to twice a day if needed for nasal symtpoms. 23 g 3   Budeson-Glycopyrrol-Formoterol (BREZTRI  AEROSPHERE) 160-9-4.8 MCG/ACT AERO Inhale 2 puffs into the lungs in the morning and at bedtime. (Patient taking  differently: Inhale 2 puffs into the lungs in the morning.) 10.7 g 11   CALCIUM PO Take 1 tablet by mouth daily.     Continuous Glucose Receiver (FREESTYLE LIBRE 14 DAY READER) DEVI by Does not apply route.     Continuous Glucose Sensor (FREESTYLE LIBRE 3 SENSOR) MISC Change sensor every 14 days 6 each 3   donepezil  (ARICEPT ) 10 MG tablet Take 1 tablet (10 mg total) by mouth at bedtime. 30 tablet 11   dupilumab  (DUPIXENT ) 300 MG/2ML prefilled syringe Inject 300 mg into the skin every 14 (fourteen) days. 4 mL 11   famotidine  (PEPCID ) 20 MG tablet Take 1 tablet (20 mg total) by mouth daily. 90 tablet 3   fluticasone  (FLONASE ) 50 MCG/ACT nasal spray Place 2 sprays into both nostrils 2 (two) times daily. 16 g 5   gabapentin (NEURONTIN) 600 MG tablet Take 600 mg by mouth as needed.     HYDROcodone-acetaminophen (NORCO/VICODIN) 5-325 MG tablet Take 1 tablet by mouth every 6 (six) hours as needed.     hydrocortisone  (CORTEF ) 10 MG tablet Take 1 tablet (10 mg total) by mouth as directed. 1.5 tab QAM and half a tab between 2-4 pm 200 tablet 3   insulin  isophane & regular human KwikPen (NOVOLIN  70/30 KWIKPEN) (70-30) 100 UNIT/ML KwikPen Inject 36 Units into the skin daily before breakfast AND 10 Units daily before supper. 45 mL 4   Insulin  Pen Needle 32G X 4 MM MISC 1 Device by Does not apply route in the morning and at bedtime. 200 each 3   ipratropium (ATROVENT ) 0.03 % nasal spray Place one spray per nostril daily (can use up to 3 times daily if needed, but this can be over drying) 30 mL 5   metFORMIN  (GLUCOPHAGE -XR) 500 MG 24 hr tablet Take 1 tablet (500 mg total) by mouth 2 (two) times daily. 180 tablet 2   omeprazole  (PRILOSEC) 40 MG capsule TAKE 1 CAPSULE (40 MG TOTAL) BY MOUTH DAILY. 60 capsule 1   Potassium 99 MG TABS Take 1 tablet by mouth daily.     Probiotic Product (PROBIOTIC DAILY PO) Take by mouth. One daily     Semaglutide ,0.25 or 0.5MG /DOS, 2 MG/3ML SOPN Inject 0.5 mg into the skin once a  week. 3 mL 3   simvastatin (ZOCOR) 20 MG tablet Take 20 mg by mouth daily.     traZODone (DESYREL) 100 MG tablet Take 100 mg by mouth.     venlafaxine XR (EFFEXOR-XR) 75 MG 24 hr capsule Take 75 mg by mouth daily.     VITAMIN D, CHOLECALCIFEROL, PO Take by mouth daily at 6 (six) AM.     lubiprostone  (AMITIZA ) 24 MCG capsule Take 1 capsule (24 mcg total) by mouth 2 (two) times daily with a meal. 180 capsule 1   Current Facility-Administered Medications  Medication Dose Route Frequency Provider Last Rate Last Admin   dupilumab  (DUPIXENT ) prefilled syringe 300 mg  300 mg Subcutaneous Q14 Days Cari Arlean HERO, FNP   300 mg at 11/06/21 1717    Review of Systems: GENERAL: negative for malaise, night sweats HEENT:  No changes in hearing or vision, no nose bleeds or other nasal problems. NECK: Negative for lumps, goiter, pain and significant neck swelling RESPIRATORY: Negative for cough, wheezing CARDIOVASCULAR: Negative for chest pain, leg swelling, palpitations, orthopnea GI: SEE HPI MUSCULOSKELETAL: Negative for joint pain or swelling, back pain, and muscle pain. SKIN: Negative for lesions, rash HEMATOLOGY Negative for prolonged bleeding, bruising easily, and swollen nodes. ENDOCRINE: Negative for cold or heat intolerance, polyuria, polydipsia and goiter. NEURO: negative for tremor, gait imbalance, syncope and seizures. The remainder of the review of systems is noncontributory.   Physical Exam: BP 126/66   Pulse 90   Temp 98 F (36.7 C)   Ht 5' 2 (1.575 m)   Wt 141 lb 4.8 oz (64.1 kg)   BMI 25.84 kg/m  GENERAL: The patient is AO x3, in no acute distress. HEENT: Head is normocephalic and atraumatic. EOMI are intact. Mouth is well hydrated and without lesions. NECK: Supple. No masses LUNGS: Clear to auscultation. No presence of rhonchi/wheezing/rales. Adequate chest expansion HEART: RRR, normal s1 and s2. ABDOMEN: Soft, left upper quadrant tenderness, no guarding, no peritoneal signs,  and nondistended. BS +. No masses.   Imaging/Labs: as above     Latest Ref Rng & Units 02/17/2021    1:32 PM  CBC  WBC 3.4 - 10.8 x10E3/uL 12.3   Hemoglobin 11.1 - 15.9 g/dL 85.3   Hematocrit 65.9 - 46.6 % 44.6   Platelets 150 - 450 x10E3/uL 319    No results found for: IRON, TIBC, FERRITIN  I personally reviewed and interpreted the available labs, imaging and endoscopic files.  Impression and Plan: Cassy Sprowl is a 73 y.o. female with past medical history of asthma, COPD, dyslipidemia, GERD, DM on ozempic  , OSA, IBS-C with recurrent abdominal pain  # Left upper quadrant abdominal pain # History of diverticulitis # IBS-C  Patient was treated for uncomplicated diverticulitis in April 2025.  She is up-to-date with colonoscopy February 2025.  Does have evidence of diverticulosis per last colonoscopy and is not due until 5 years  Today on exam she has left upper quadrant tenderness and could be dealing with recurrent diverticulitis here which would be patient's third episode  No NSAID use but patient does have some constipation despite using Amitiza  8 mcg  Will obtain CT abdomen pelvis stat to evaluate any complications from diverticulitis, if diverticulitis is encountered we will give 10-day course of ciprofloxacin  and Flagyl  as patient is not allergic to penicillin and sulfa  Low fiber/ roughage diet for next few days and continue to avoid NSAIDs  Will optimize Amitiza  dose of 24 mcg for IBS-C/CIC  Consider exploring other alternatives to Ozempic  as GLP-1 are notorious to cause GI side effects  Since the patient continues to have recurrent diverticulitis may benefit from surgical evaluation  Will have a close clinic follow-up All questions were answered.      Carrie Schoonmaker Faizan Brodee Mauritz, MD Gastroenterology and Hepatology [facility]   This chart has been completed using [facility] Dictation software, and while attempts have been made to  ensure accuracy , certain words and phrases may not be transcribed as intended

## 2024-01-01 ENCOUNTER — Telehealth: Payer: Self-pay

## 2024-01-01 DIAGNOSIS — M79641 Pain in right hand: Secondary | ICD-10-CM | POA: Diagnosis not present

## 2024-01-01 DIAGNOSIS — M79671 Pain in right foot: Secondary | ICD-10-CM | POA: Diagnosis not present

## 2024-01-01 DIAGNOSIS — Z6824 Body mass index (BMI) 24.0-24.9, adult: Secondary | ICD-10-CM | POA: Diagnosis not present

## 2024-01-01 DIAGNOSIS — R0781 Pleurodynia: Secondary | ICD-10-CM | POA: Diagnosis not present

## 2024-01-01 NOTE — Telephone Encounter (Signed)
 Patient continues to have GI symptoms with the Ozempic  and would like to know if there is something else she can be prescribe. Dr. Sam patient.

## 2024-01-01 NOTE — Telephone Encounter (Signed)
 Emily Humphrey, She can stop the Ozempic  and continue only the insulin  regimen and metformin . If the sugars increase during the day, she may need to take a slightly higher dose of insulin  70/30 in the morning.  However, please let us  know how the sugars are running, in that case.

## 2024-01-01 NOTE — Telephone Encounter (Signed)
 LVDTVM on patient phone as advised and patient will callback if she has other questions

## 2024-01-02 ENCOUNTER — Telehealth (INDEPENDENT_AMBULATORY_CARE_PROVIDER_SITE_OTHER): Payer: Self-pay | Admitting: Gastroenterology

## 2024-01-02 ENCOUNTER — Other Ambulatory Visit (INDEPENDENT_AMBULATORY_CARE_PROVIDER_SITE_OTHER): Payer: Self-pay

## 2024-01-02 DIAGNOSIS — K5732 Diverticulitis of large intestine without perforation or abscess without bleeding: Secondary | ICD-10-CM

## 2024-01-02 MED ORDER — METRONIDAZOLE 500 MG PO TABS: 500.0000 mg | ORAL_TABLET | Freq: Three times a day (TID) | ORAL | 0 refills | Status: AC

## 2024-01-02 MED ORDER — CIPROFLOXACIN HCL 500 MG PO TABS: 500.0000 mg | ORAL_TABLET | Freq: Two times a day (BID) | ORAL | 0 refills | Status: AC

## 2024-01-02 MED ORDER — CIPROFLOXACIN HCL 500 MG PO TABS: 500.0000 mg | ORAL_TABLET | Freq: Two times a day (BID) | ORAL | 0 refills | Status: DC

## 2024-01-02 MED ORDER — METRONIDAZOLE 500 MG PO TABS: 500.0000 mg | ORAL_TABLET | Freq: Three times a day (TID) | ORAL | 0 refills | Status: DC

## 2024-01-02 NOTE — Telephone Encounter (Signed)
 Pt called in and states she is still in excruciating pain on her left side under her rib. Has taking Tylenol and pain pills but doesn't like to take pain pills. Pt was seen on 12/31/23 by Dr.Ahmed  and had CT completed. Pt states that pain has not really let up any since being seen. I advised patient she may need to go to ER since pain is still there and not easing up but I would check with provider first. Please advise. Thank you.

## 2024-01-02 NOTE — Telephone Encounter (Signed)
 Pt contacted. Pt states the pain has eased up, no fever, no chills. Pt would like antibiotic to go to [facility] [place].

## 2024-01-02 NOTE — Telephone Encounter (Signed)
 Please advise. Thank you

## 2024-01-02 NOTE — Telephone Encounter (Signed)
 Hi you can advise patient if pain is severe she can go to the ED to get assessed and possible repeat CT  . She recently had a negative CT but may have early diverticulitis,if she wants  I can send in oral antibiotics if pain is not severe and no fever/chills

## 2024-01-02 NOTE — Telephone Encounter (Signed)
 Fax received from [facility] stating they were unable to fill script sent to them today due to them being a specialty pharmacy. Resent Cipro  and Flagyl  to [facility]

## 2024-01-02 NOTE — Addendum Note (Signed)
 Addended by: CINDERELLA DEATRICE SMILES on: 01/02/2024 02:01 PM   Modules accepted: Orders

## 2024-01-03 DIAGNOSIS — Z6825 Body mass index (BMI) 25.0-25.9, adult: Secondary | ICD-10-CM | POA: Diagnosis not present

## 2024-01-03 DIAGNOSIS — J189 Pneumonia, unspecified organism: Secondary | ICD-10-CM | POA: Diagnosis not present

## 2024-01-03 DIAGNOSIS — E663 Overweight: Secondary | ICD-10-CM | POA: Diagnosis not present

## 2024-01-03 DIAGNOSIS — R030 Elevated blood-pressure reading, without diagnosis of hypertension: Secondary | ICD-10-CM | POA: Diagnosis not present

## 2024-01-06 ENCOUNTER — Telehealth: Payer: Self-pay | Admitting: Allergy & Immunology

## 2024-01-06 MED ORDER — ALBUTEROL SULFATE (2.5 MG/3ML) 0.083% IN NEBU: 2.5000 mg | INHALATION_SOLUTION | RESPIRATORY_TRACT | 1 refills | Status: DC | PRN

## 2024-01-06 MED ORDER — ALBUTEROL SULFATE (2.5 MG/3ML) 0.083% IN NEBU: 2.5000 mg | INHALATION_SOLUTION | RESPIRATORY_TRACT | 1 refills | Status: AC | PRN

## 2024-01-06 NOTE — Telephone Encounter (Signed)
 PT called to get refill on albeuterol for nebulizer [facility]

## 2024-01-06 NOTE — Telephone Encounter (Signed)
 I called pt and left a message with informing her the medication was [facility].

## 2024-01-08 DIAGNOSIS — Z6824 Body mass index (BMI) 24.0-24.9, adult: Secondary | ICD-10-CM | POA: Diagnosis not present

## 2024-01-08 DIAGNOSIS — J189 Pneumonia, unspecified organism: Secondary | ICD-10-CM | POA: Diagnosis not present

## 2024-01-08 DIAGNOSIS — R0781 Pleurodynia: Secondary | ICD-10-CM | POA: Diagnosis not present

## 2024-01-15 DIAGNOSIS — Z6824 Body mass index (BMI) 24.0-24.9, adult: Secondary | ICD-10-CM | POA: Diagnosis not present

## 2024-01-15 DIAGNOSIS — R0781 Pleurodynia: Secondary | ICD-10-CM | POA: Diagnosis not present

## 2024-01-15 DIAGNOSIS — J189 Pneumonia, unspecified organism: Secondary | ICD-10-CM | POA: Diagnosis not present

## 2024-01-20 ENCOUNTER — Emergency Department (HOSPITAL_COMMUNITY)
Admission: EM | Admit: 2024-01-20 | Discharge: 2024-01-20 | Disposition: A | Discharge status: Home / Self Care | Source: Ambulatory Visit | Attending: Emergency Medicine | Admitting: Emergency Medicine

## 2024-01-20 ENCOUNTER — Encounter (HOSPITAL_COMMUNITY): Payer: Self-pay | Admitting: Emergency Medicine

## 2024-01-20 ENCOUNTER — Telehealth (INDEPENDENT_AMBULATORY_CARE_PROVIDER_SITE_OTHER): Payer: Self-pay | Admitting: Gastroenterology

## 2024-01-20 ENCOUNTER — Other Ambulatory Visit: Payer: Self-pay

## 2024-01-20 ENCOUNTER — Emergency Department (HOSPITAL_COMMUNITY)

## 2024-01-20 DIAGNOSIS — I2510 Atherosclerotic heart disease of native coronary artery without angina pectoris: Secondary | ICD-10-CM | POA: Diagnosis not present

## 2024-01-20 DIAGNOSIS — Z7984 Long term (current) use of oral hypoglycemic drugs: Secondary | ICD-10-CM | POA: Insufficient documentation

## 2024-01-20 DIAGNOSIS — E119 Type 2 diabetes mellitus without complications: Secondary | ICD-10-CM | POA: Diagnosis not present

## 2024-01-20 DIAGNOSIS — Z7951 Long term (current) use of inhaled steroids: Secondary | ICD-10-CM | POA: Diagnosis not present

## 2024-01-20 DIAGNOSIS — J432 Centrilobular emphysema: Secondary | ICD-10-CM | POA: Diagnosis not present

## 2024-01-20 DIAGNOSIS — R1012 Left upper quadrant pain: Secondary | ICD-10-CM | POA: Insufficient documentation

## 2024-01-20 DIAGNOSIS — R079 Chest pain, unspecified: Secondary | ICD-10-CM | POA: Diagnosis present

## 2024-01-20 DIAGNOSIS — R0789 Other chest pain: Secondary | ICD-10-CM | POA: Insufficient documentation

## 2024-01-20 DIAGNOSIS — J449 Chronic obstructive pulmonary disease, unspecified: Secondary | ICD-10-CM | POA: Insufficient documentation

## 2024-01-20 DIAGNOSIS — Z794 Long term (current) use of insulin: Secondary | ICD-10-CM | POA: Diagnosis not present

## 2024-01-20 DIAGNOSIS — I700 Atherosclerosis of aorta: Secondary | ICD-10-CM | POA: Diagnosis not present

## 2024-01-20 LAB — COMPREHENSIVE METABOLIC PANEL WITH GFR
ALT: 14 U/L (ref 0–44)
AST: 24 U/L (ref 15–41)
Albumin: 4.1 g/dL (ref 3.5–5.0)
Alkaline Phosphatase: 71 U/L (ref 38–126)
Anion gap: 10 (ref 5–15)
BUN: 9 mg/dL (ref 8–23)
CO2: 22 mmol/L (ref 22–32)
Calcium: 9.3 mg/dL (ref 8.9–10.3)
Chloride: 109 mmol/L (ref 98–111)
Creatinine, Ser: 0.77 mg/dL (ref 0.44–1.00)
GFR, Estimated: >60 mL/min (ref >60)
Glucose, Bld: 92 mg/dL (ref 70–99)
Potassium: 3.6 mmol/L (ref 3.5–5.1)
Sodium: 141 mmol/L (ref 135–145)
Total Bilirubin: 0.8 mg/dL (ref 0.0–1.2)
Total Protein: 7.9 g/dL (ref 6.5–8.1)

## 2024-01-20 LAB — CBC WITH DIFFERENTIAL/PLATELET
Basophils Absolute: 0.1 K/uL (ref 0.0–0.1)
Basophils Relative: 1 %
Eosinophils Absolute: 2.6 K/uL — ABNORMAL HIGH (ref 0.0–0.5)
Eosinophils Relative: 31 %
HCT: 42.3 % (ref 36.0–46.0)
Hemoglobin: 14 g/dL (ref 12.0–15.0)
Lymphocytes Relative: 30 %
Lymphs Abs: 2.5 K/uL (ref 0.7–4.0)
MCH: 28.7 pg (ref 26.0–34.0)
MCHC: 33.1 g/dL (ref 30.0–36.0)
MCV: 86.9 fL (ref 80.0–100.0)
Monocytes Absolute: 0.3 K/uL (ref 0.1–1.0)
Monocytes Relative: 3 %
Neutro Abs: 2.9 K/uL (ref 1.7–7.7)
Neutrophils Relative %: 35 %
Platelets: 196 K/uL (ref 150–400)
RBC: 4.87 MIL/uL (ref 3.87–5.11)
RDW: 13.1 % (ref 11.5–15.5)
Smear Review: NORMAL
WBC: 8.4 K/uL (ref 4.0–10.5)
nRBC: 0 % (ref 0.0–0.2)

## 2024-01-20 LAB — D-DIMER, QUANTITATIVE: D-Dimer, Quant: 0.9 ug{FEU}/mL — ABNORMAL HIGH (ref 0.00–0.50)

## 2024-01-20 LAB — TROPONIN I (HIGH SENSITIVITY): Troponin I (High Sensitivity): 3 ng/L (ref <18)

## 2024-01-20 LAB — LIPASE, BLOOD: Lipase: 33 U/L (ref 11–51)

## 2024-01-20 MED ORDER — KETOROLAC TROMETHAMINE 15 MG/ML IJ SOLN
15.0000 mg | Freq: Once | INTRAMUSCULAR | Status: AC
  Administered 2024-01-20: 15 mg via INTRAVENOUS
  Filled 2024-01-20: qty 1

## 2024-01-20 MED ORDER — ONDANSETRON HCL 4 MG/2ML IJ SOLN
4.0000 mg | Freq: Once | INTRAMUSCULAR | Status: AC
  Administered 2024-01-20: 4 mg via INTRAVENOUS
  Filled 2024-01-20: qty 2

## 2024-01-20 MED ORDER — LIDOCAINE 5 % EX PTCH: 1.0000 | MEDICATED_PATCH | CUTANEOUS | 0 refills | Status: AC

## 2024-01-20 MED ORDER — IOHEXOL 350 MG/ML SOLN
75.0000 mL | Freq: Once | INTRAVENOUS | Status: AC | PRN
  Administered 2024-01-20: 75 mL via INTRAVENOUS

## 2024-01-20 MED ORDER — FENTANYL CITRATE (PF) 100 MCG/2ML IJ SOLN
25.0000 ug | Freq: Once | INTRAMUSCULAR | Status: AC
  Administered 2024-01-20: 25 ug via INTRAVENOUS
  Filled 2024-01-20: qty 2

## 2024-01-20 NOTE — Discharge Instructions (Addendum)
 Your workup here is reassuring today.  Your kidney, liver, and pancreas labs are normal.  Your electrolytes and blood counts are normal.  Your heart test (troponin) was normal today. This would be elevated in the setting of a heart attack.  The CT scanning of your chest did not show any blood clot in your lungs.  No signs of pneumonia. There were some plaques (atherosclerosis) noted in your arteries. Please make your PCP aware.   Please follow-up with your PCP within the next month for further management and workup of your symptoms.  You may take up to 1000mg  of tylenol every 6 hours as needed for pain.  Do not take more then 4g per day.  You may use up to 600mg  ibuprofen every 6 hours as needed for pain.  Do not exceed 2.4g of ibuprofen per day.  I have prescribed you lidocaine  patches to use for pain.  You may apply 1 patch to the chest wall for up to 12 hours at a time.  You must then remove the patch for full 12 hours before reapplying a new patch.  Return to the ER for worsening chest pain, shortness of breath, unexplained fevers, any other new or concerning symptoms

## 2024-01-20 NOTE — ED Triage Notes (Signed)
 Pt c/o left sided upper abd pain that radiates to left flank and back for weeks, reports she has been seen by gastro and had CT scan, given abx

## 2024-01-20 NOTE — Telephone Encounter (Signed)
 Pt left voicemail stating that she was having severe pain in the abdomen on left side. Pt stated in voicemail I guess ill go to ER Returned call to pt. Pt states she was given Amitiza  24 mg. She is now hurting around in her back. She took all the antibiotics that were prescribed to her. Pt states the pain has never left. Pt stated she was on 14 and heading to [facility] ER.

## 2024-01-20 NOTE — ED Provider Notes (Cosign Needed Addendum)
EMERGENCY DEPARTMENT AT [facility] Provider Note   CSN: [id number] Arrival date & time: 01/20/24  9149     Patient presents with: Abdominal Pain   Emily Humphrey is a 73 y.o. female with history of diverticulosis, COPD, type II diabetes, presents with concern for left upper quadrant pain that has been ongoing for a couple months.  States pain is constant.  Does not worsen with movement, inspiration, or eating.  Reports she is started on multiple antibiotics recently for treatment of a left lower lobe pneumonia as well as diverticulosis.  She has completed the full course of these antibiotics without any improvement in her symptoms.  She was started on Amitiza  to help with her constipation by her GI doctor and is now having multiple episodes of diarrhea per day.  No bloody stools.  Denies any fever or chills.  Does report feeling nauseous, but no vomiting.    Abdominal Pain      Prior to Admission medications   Medication Sig Start Date End Date Taking? Authorizing Provider  lidocaine  (LIDODERM ) 5 % Place 1 patch onto the skin daily. Remove & Discard patch within 12 hours or as directed by MD 01/20/24  Yes Veta Palma, PA-C  ACCU-CHEK AVIVA PLUS test strip  01/29/22   [provider]  albuterol  (PROVENTIL ) (2.5 MG/3ML) 0.083% nebulizer solution Take 3 mLs (2.5 mg total) by nebulization every 4 (four) hours as needed for wheezing or shortness of breath. 01/06/24   Iva Marty Saltness, MD  albuterol  (VENTOLIN  HFA) 108 (90 Base) MCG/ACT inhaler Inhale 2 puffs into the lungs every 4 (four) hours as needed. 09/20/23   Cari Arlean HERO, FNP  alendronate (FOSAMAX) 70 MG tablet Take 70 mg by mouth. 11/20/23   [provider]  ALPRAZolam (XANAX) 0.25 MG tablet Take 0.25 mg by mouth daily as needed. 10/27/21   [provider]  azelastine  (ASTELIN ) 0.1 % nasal spray Place 2 sprays into both nostrils 2 (two) times daily. Use in each nostril as directed 10/07/23    Ambs, Arlean HERO, FNP  Azelastine -Fluticasone  137-50 MCG/ACT SUSP 2 sprays in each nostril up to twice a day if needed for nasal symtpoms. 09/23/23   Cari Arlean HERO, FNP  Budeson-Glycopyrrol-Formoterol (BREZTRI  AEROSPHERE) 160-9-4.8 MCG/ACT AERO Inhale 2 puffs into the lungs in the morning and at bedtime. Patient taking differently: Inhale 2 puffs into the lungs in the morning. 01/10/23   Ambs, Arlean HERO, FNP  CALCIUM PO Take 1 tablet by mouth daily.    [provider]  Continuous Glucose Receiver (FREESTYLE LIBRE 14 DAY READER) DEVI by Does not apply route.    [provider]  Continuous Glucose Sensor (FREESTYLE LIBRE 3 SENSOR) MISC Change sensor every 14 days 08/05/23   Shamleffer, Ibtehal Jaralla, MD  donepezil  (ARICEPT ) 10 MG tablet Take 1 tablet (10 mg total) by mouth at bedtime. 05/10/20   Onita Duos, MD  dupilumab  (DUPIXENT ) 300 MG/2ML prefilled syringe Inject 300 mg into the skin every 14 (fourteen) days. 06/19/23   Cheryl Reusing, FNP  famotidine  (PEPCID ) 20 MG tablet Take 1 tablet (20 mg total) by mouth daily. 12/10/23   Carlan, Chelsea L, NP  fluticasone  (FLONASE ) 50 MCG/ACT nasal spray Place 2 sprays into both nostrils 2 (two) times daily. 10/07/23   Cari Arlean HERO, FNP  gabapentin (NEURONTIN) 600 MG tablet Take 600 mg by mouth as needed.    [provider]  HYDROcodone-acetaminophen (NORCO/VICODIN) 5-325 MG tablet Take 1 tablet by mouth every  6 (six) hours as needed. 11/28/22   [provider]  hydrocortisone  (CORTEF ) 10 MG tablet Take 1 tablet (10 mg total) by mouth as directed. 1.5 tab QAM and half a tab between 2-4 pm 06/03/23   Shamleffer, Ibtehal Jaralla, MD  insulin  isophane & regular human KwikPen (NOVOLIN  70/30 KWIKPEN) (70-30) 100 UNIT/ML KwikPen Inject 36 Units into the skin daily before breakfast AND 10 Units daily before supper. 06/28/23   Shamleffer, Ibtehal Jaralla, MD  Insulin  Pen Needle 32G X 4 MM MISC 1 Device by Does not apply route in the morning and at  bedtime. 06/03/23   Shamleffer, Ibtehal Jaralla, MD  ipratropium (ATROVENT ) 0.03 % nasal spray Place one spray per nostril daily (can use up to 3 times daily if needed, but this can be over drying) 08/26/23   Iva Marty Saltness, MD  lubiprostone  (AMITIZA ) 24 MCG capsule Take 1 capsule (24 mcg total) by mouth 2 (two) times daily with a meal. 12/31/23 06/28/24  Ahmed, Deatrice FALCON, MD  metFORMIN  (GLUCOPHAGE -XR) 500 MG 24 hr tablet Take 1 tablet (500 mg total) by mouth 2 (two) times daily. 07/16/22   Shamleffer, Ibtehal Jaralla, MD  omeprazole  (PRILOSEC) 40 MG capsule TAKE 1 CAPSULE (40 MG TOTAL) BY MOUTH DAILY. 11/14/23   Carlan, Chelsea L, NP  Potassium 99 MG TABS Take 1 tablet by mouth daily.    [provider]  Probiotic Product (PROBIOTIC DAILY PO) Take by mouth. One daily    [provider]  Semaglutide ,0.25 or 0.5MG /DOS, 2 MG/3ML SOPN Inject 0.5 mg into the skin once a week. 06/28/23   Shamleffer, Ibtehal Jaralla, MD  simvastatin (ZOCOR) 20 MG tablet Take 20 mg by mouth daily.    [provider]  traZODone (DESYREL) 100 MG tablet Take 100 mg by mouth. 08/27/23   [provider]  venlafaxine XR (EFFEXOR-XR) 75 MG 24 hr capsule Take 75 mg by mouth daily. 06/21/22   [provider]  VITAMIN D, CHOLECALCIFEROL, PO Take by mouth daily at 6 (six) AM.    [provider]    Allergies: Atorvastatin, Empagliflozin, Penicillins, and Sulfa antibiotics    Review of Systems  Gastrointestinal:  Positive for abdominal pain.    Updated Vital Signs BP (!) 144/66 (BP Location: Right Arm)   Pulse 73   Temp 98.1 F (36.7 C) (Oral)   Resp 14   Ht 5' 2 (1.575 m)   Wt 64 kg   SpO2 96%   BMI 25.79 kg/m   Physical Exam Vitals and nursing note reviewed.  Constitutional:      General: She is not in acute distress.    Appearance: She is well-developed.  HENT:     Head: Normocephalic and atraumatic.  Eyes:     Conjunctiva/sclera: Conjunctivae normal.   Cardiovascular:     Rate and Rhythm: Normal rate and regular rhythm.     Heart sounds: No murmur heard. Pulmonary:     Effort: Pulmonary effort is normal. No respiratory distress.     Breath sounds: Normal breath sounds.  Abdominal:     Palpations: Abdomen is soft.     Tenderness: There is abdominal tenderness.     Comments: Mild tenderness over the left lower rib cage and left upper quadrant.  No rebound or guarding.  No overlying skin changes or rash.  Musculoskeletal:        General: No swelling.     Cervical back: Neck supple.  Skin:    General: Skin is warm and  dry.     Capillary Refill: Capillary refill takes less than 2 seconds.  Neurological:     Mental Status: She is alert.  Psychiatric:        Mood and Affect: Mood normal.     (all labs ordered are listed, but only abnormal results are displayed) Labs Reviewed  CBC WITH DIFFERENTIAL/PLATELET - Abnormal; Notable for the following components:      Result Value   Eosinophils Absolute 2.6 (*)    All other components within normal limits  D-DIMER, QUANTITATIVE - Abnormal; Notable for the following components:   D-Dimer, Quant 0.90 (*)    All other components within normal limits  GASTROINTESTINAL PANEL BY PCR, STOOL (REPLACES STOOL CULTURE)  C DIFFICILE QUICK SCREEN W PCR REFLEX    COMPREHENSIVE METABOLIC PANEL WITH GFR  LIPASE, BLOOD  URINALYSIS, ROUTINE W REFLEX MICROSCOPIC  PATHOLOGIST SMEAR REVIEW  TROPONIN I (HIGH SENSITIVITY)    EKG: None  Radiology: CT Angio Chest PE W and/or Wo Contrast Result Date: 01/20/2024 CLINICAL DATA:  Left-sided upper abdominal pain radiating to the back EXAM: CT ANGIOGRAPHY CHEST WITH CONTRAST TECHNIQUE: Multidetector CT imaging of the chest was performed using the standard protocol during bolus administration of intravenous contrast. Multiplanar CT image reconstructions and MIPs were obtained to evaluate the vascular anatomy. RADIATION DOSE REDUCTION: This exam was performed  according to the departmental dose-optimization program which includes automated exposure control, adjustment of the mA and/or kV according to patient size and/or use of iterative reconstruction technique. CONTRAST:  75mL OMNIPAQUE  IOHEXOL  350 MG/ML SOLN COMPARISON:  Chest radiograph dated 01/08/2024 FINDINGS: Cardiovascular: The study is satisfactory for the evaluation of pulmonary embolism. There are no filling defects in the central, lobar, segmental or subsegmental pulmonary artery branches to suggest acute pulmonary embolism. Great vessels are normal in course and caliber. Normal heart size. No significant pericardial fluid/thickening. Coronary artery calcifications and aortic atherosclerosis. Mediastinum/Nodes: Imaged thyroid  gland without nodules meeting criteria for imaging follow-up by size. Normal esophagus. No pathologically enlarged axillary, supraclavicular, mediastinal, or hilar lymph nodes. Lungs/Pleura: The central airways are patent. Mild upper lobe predominant centrilobular emphysema. Subsegmental medial left basilar atelectasis. No focal consolidation. No pneumothorax. No pleural effusion. Upper abdomen: Normal. Musculoskeletal: No acute or abnormal lytic or blastic osseous lesions. Multilevel degenerative changes of the thoracic spine. Review of the MIP images confirms the above findings. IMPRESSION: 1. No evidence of pulmonary embolism or other acute intrathoracic process. 2. Aortic Atherosclerosis (ICD10-I70.0) and Emphysema (ICD10-J43.9). Coronary artery calcifications. Assessment for potential risk factor modification, dietary therapy or pharmacologic therapy may be warranted, if clinically indicated. Electronically Signed   By: Limin  Xu M.D.   On: 01/20/2024 12:17     Procedures   Medications Ordered in the ED  ondansetron  (ZOFRAN ) injection 4 mg (4 mg Intravenous Given 01/20/24 0951)  fentaNYL  (SUBLIMAZE ) injection 25 mcg (25 mcg Intravenous Given 01/20/24 0952)  ketorolac  (TORADOL )  15 MG/ML injection 15 mg (15 mg Intravenous Given 01/20/24 0952)  iohexol  (OMNIPAQUE ) 350 MG/ML injection 75 mL (75 mLs Intravenous Contrast Given 01/20/24 1201)                                    Medical Decision Making Amount and/or Complexity of Data Reviewed Labs: ordered. Radiology: ordered.  Risk Prescription drug management.     Differential diagnosis includes but is not limited to PE, pneumonia, diverticulitis, UTI, pyelonephritis  ED Course:  Upon initial  evaluation, patient is well-appearing, no acute distress.  Stable vitals aside from elevated blood pressure 144/66.  He has tenderness to the left lower rib cage as well as left upper quadrant.  No rebound or guarding.  No overlying skin change.  Reports this has been a chronic pain for her.  Labs Ordered: I Ordered, and personally interpreted labs.  The pertinent results include:   CBC within normal limits CMP within normal limits Lipase within normal limits Troponin of 3 D-dimer elevated at 0.9 C. difficile and GI stool panel ordered patient unable to give sample  Imaging Studies ordered: I ordered imaging studies including CTA chest I independently visualized the imaging with scope of interpretation limited to determining acute life threatening conditions related to emergency care. Imaging showed  IMPRESSION: 1. No evidence of pulmonary embolism or other acute intrathoracic process. 2. Aortic Atherosclerosis (ICD10-I70.0) and Emphysema (ICD10-J43.9). Coronary artery calcifications. Assessment for potential risk factor modification, dietary therapy or pharmacologic therapy may be warranted, if clinically indicated. I agree with the radiologist interpretation   Cardiac Monitoring: / EKG: The patient was maintained on a cardiac monitor.  I personally viewed and interpreted the cardiac monitored which showed an underlying rhythm of: Normal sinus rhythm   Medications Given: Fentanyl  Zofran  Toradol   Upon  re-evaluation, patient reports pain significantly improved with medications given.  Discussed that her labs are overall reassuring.  CBC is without leukocytosis, no elevation in creatinine, LFTs, or lipase.  Normal electrolytes.  Given unremarkable CT scan from 7/15 of the abdomen pelvis, no change in symptoms, do not feel we need to repeat any CT imaging of the abdomen/pelvis at this time.  D-dimer was elevated at 0.9, CTA chest was obtained which did not show any evidence of pulmonary embolism.  No signs of pneumonia or other acute abnormality.  Unclear cause to patient's pain at this time.  However, given it has been ongoing for several weeks without acute worsening, reassuring workup here, I have low concern for emergent etiology. She was having pain before having diarrhea and before being placed on antibiotics, no fevers or foul smelling stool,  lower concern for C Difficile or other GI pathogen. She was tender over the left lower ribs, and I question if this could be more of a musculoskeletal pain.  Stable and appropriate for discharge home    Impression: Left sided lower chest wall pain  Disposition:  The patient was discharged home with instructions to take Tylenol and ibuprofen as needed for pain.  She was prescribed lidocaine  patches to use for pain.  Follow-up with PCP within the next month for further workup and management Return precautions given.    Record Review: External records from outside source obtained and reviewed including CT abdomen and pelvis from 7/15 which showed diverticulosis, but no other acute abnormalities     This chart was dictated using voice recognition software, Dragon. Despite the best efforts of this provider to proofread and correct errors, errors may still occur which can change documentation meaning.       Final diagnoses:  Left-sided chest wall pain    ED Discharge Orders          Ordered    lidocaine  (LIDODERM ) 5 %  Every 24 hours         01/20/24 1306               Veta Palma, PA-C 01/20/24 1308    Veta Palma, PA-C 01/20/24 1309    Suzette Pac, MD 01/22/24  1355  

## 2024-01-21 LAB — PATHOLOGIST SMEAR REVIEW: Path Review: NEGATIVE

## 2024-01-22 DIAGNOSIS — R0781 Pleurodynia: Secondary | ICD-10-CM | POA: Diagnosis not present

## 2024-01-22 DIAGNOSIS — R1012 Left upper quadrant pain: Secondary | ICD-10-CM | POA: Diagnosis not present

## 2024-01-22 DIAGNOSIS — Z6824 Body mass index (BMI) 24.0-24.9, adult: Secondary | ICD-10-CM | POA: Diagnosis not present

## 2024-01-30 ENCOUNTER — Ambulatory Visit (INDEPENDENT_AMBULATORY_CARE_PROVIDER_SITE_OTHER): Admitting: Gastroenterology

## 2024-01-30 ENCOUNTER — Ambulatory Visit: Payer: Self-pay | Admitting: Internal Medicine

## 2024-01-30 NOTE — Telephone Encounter (Signed)
 Pt complains of pain under ribs and causing difficulty breathing, asthma acting for a few weeks. Has appt scheduled.

## 2024-01-30 NOTE — Telephone Encounter (Signed)
 FYI Only or Action Required?: FYI only for provider.  Patient is followed in Pulmonology for COPD, last seen on 07/16/2023 by [name], MD.  Called Nurse Triage reporting Shortness of Breath.  Symptoms began a couple of months ago.  Interventions attempted: Rescue inhaler, Maintenance inhaler, Nebulizer treatments, and Increased fluids/rest.  Symptoms are: unchanged.  Triage Disposition: See PCP Within 2 Weeks  Patient/caregiver understands and will follow disposition?:   Copied from CRM [id number]. Topic: Clinical - Red Word Triage >> Jan 30, 2024  1:29 PM Emily Humphrey wrote: Red Word that prompted transfer to Nurse Triage: Severe persistent rib pain from COPD/ asthma flare ups. Reason for Disposition  [1] MILD longstanding difficulty breathing (e.g., minimal/no SOB at rest, SOB with walking, pulse < 100) AND [2] SAME as normal  Answer Assessment - Initial Assessment Questions 1. RESPIRATORY STATUS: Describe your breathing? (e.g., wheezing, shortness of breath, unable to speak, severe coughing)      Shortness of breath, wheezing 2. ONSET: When did this breathing problem begin?      Couple of months ago 3. PATTERN Does the difficult breathing come and go, or has it been constant since it started?      intermittent 4. SEVERITY: How bad is your breathing? (e.g., mild, moderate, severe)      Mild 5. RECURRENT SYMPTOM: Have you had difficulty breathing before? If Yes, ask: When was the last time? and What happened that time?      yes 6. CARDIAC HISTORY: Do you have any history of heart disease? (e.g., heart attack, angina, bypass surgery, angioplasty)      no 7. LUNG HISTORY: Do you have any history of lung disease?  (e.g., pulmonary embolus, asthma, emphysema)     COPD, asthma 8. CAUSE: What do you think is causing the breathing problem?      Patient is concerned for COPD flare-having persistent rib pain 9. OTHER SYMPTOMS: Do you have any other symptoms?  (e.g., chest pain, cough, dizziness, fever, runny nose)     Rib pain,  10. O2 SATURATION MONITOR:  Do you use an oxygen saturation monitor (pulse oximeter) at home? If Yes, ask: What is your reading (oxygen level) today? What is your usual oxygen saturation reading? (e.g., 95%)       Patient doesn't have a pulse oximeter.  12. TRAVEL: Have you traveled out of the country in the last month? (e.g., travel history, exposures)       No   Patient has been seen in the ED and PCP for persistent rib pain. Patient states reports intermittent shortness of breath that is mild in nature for the last couple of months. Patient reports using her nebulize more along with her regular inhaler. Patient instructed to speak with Asthma and Allergy office as they manage her Dupixent.  Acute appointment made with pulmonary for Monday August 18 at 2:30 PM. Patient verbalized understanding and all questions answered.  Protocols used: Breathing Difficulty-A-AH

## 2024-02-02 NOTE — Progress Notes (Unsigned)
 Emily Humphrey, female    DOB: Dec 14, 1950, 73 y.o.   MRN: [id number]   Brief patient profile:  72 yowf  quit smoking 1996   self-referred to pulmonary clinic in [place]  02/17/2021 for copd with symptoms starting 2 m p quit smoking with initial dx asthma/ sinus dz at wt = 140 and daily symptoms on prednisone  ever since.   Sinus surgery x early 2000s  = dx polyps / recurrent    History of Present Illness  02/17/2021  Pulmonary/ 1st office eval/ Emily Humphrey / [place] [facility]  Chief Complaint  Patient presents with   Consult    Patient has Asthma and COPD. Shortness of breath with exertion and chest tightness. Dry cough  Dyspnea:  mb and back to house wears her out even on pred 40 Cough: dry cough  Sleep: on side bed is horizontal / 2 pillows  SABA use: avg saba hfa sev times plus neb bid  Had hb resolved on ppi bid  Rec We will be referring you to allergy here in [place] > done 04/14/21  Pantoprazole (protonix) 40 mg  Take  30-60 min before first meal of the day and Pepcid  (famotidine )  20 mg after supper until return to office GERD diet reviewed, bed blocks rec   Plan A = Automatic = Always=     Try breztri  Take 2 puffs first thing in am and then another 2 puffs about 12 hours later.  Plan B = Backup (to supplement plan A, not to replace it) Only use your albuterol  inhaler as a rescue medication Plan C = Crisis (instead of Plan B but only if Plan B stops working) - only use your albuterol  nebulizer if you first try Plan B t Also Ok to try albuterol  15 min before an activity (on alternating days)  that you know would usually make you short of breath    Plan D = Deltasone  = prednisone  >>> double the dose you usually take until better then one daily with breafast         08/09/2021  f/u ov/[place] office/Emily Humphrey re: doe s obst/ maint on breztri  free from [place] but   still on prednisone  10 mg daily for asthma by PCP - was prev told could never stop it due to adrenal insuff Chief Complaint   Patient presents with   Follow-up    SOB and cough have improved a little since last OV.    Dyspnea:  not limited by breathing but by legs and feet  Cough: not much/ lots of nasal congestion Sleeping: flat  bed with 2-3 pillows  SABA use: nebulizer twice weekly  02: none  Covid status: all the vax   Rec No change in medications See Dr Iva for dupixent  injections  or alternative to reduce your need for prednisoe for asthma and polyps   07/16/2023  Re-establish  ov/[place] office/Emily Humphrey re: doe/   maint on dupixent  and breztri    Chief Complaint  Patient presents with   Follow-up  Dyspnea:  Not limited by breathing from desired activities  but by knees/ hips / plantar fascitis  Cough: none/ still has nasal congest / no ent  Sleeping: flat bed, 203 pillows s    resp cc  SABA use: none  02: none  Rec Stop pm dose of Breztri   After a month if doing as well drop breztri  to 1 puff in am  and after a month change breztri  to where  you take it up to 2 puffs  every 12 hours as needed  Blow breztri   out thru nose Pulmonary follow up is as needed    02/03/2024  ACUTE  ov/[place] office/Emily Humphrey re: new L flank pain since April 2025   maint on breztri  prn but hasn't restarted 2bid despite flare of purulent bronchitis x sev week   Chief Complaint  Patient presents with   Shortness of Breath    Shob - radiating chest pain - in a lot of pain Muscular infection between her ribs Drs said   Dyspnea:  not limiting  Cough: in am > sev weeks slt greenish  Sleeping: bed is flat with 2 pillows  s   resp cc and chronic pain better supine  SABA use: starting neb instead of increasing breztri  to 2bid  02: none    No obvious day to day or daytime variability or assoc  mucus plugs or hemoptysis   or chest tightness, subjective wheeze or overt  hb symptoms.    Also denies any obvious fluctuation of symptoms with weather or environmental changes or other aggravating or alleviating factors  except as outlined above   No unusual exposure hx or h/o childhood pna/ asthma or knowledge of premature birth.  Current Allergies, Complete Past Medical History, Past Surgical History, Family History, and Social History were reviewed in [employer] record.  ROS  The following are not active complaints unless bolded Hoarseness, sore throat, dysphagia, dental problems, itching, sneezing,  nasal congestion or discharge of excess mucus or purulent secretions, ear ache,   fever, chills, sweats, unintended wt loss or wt gain, classically exertional cp,  orthopnea pnd or arm/hand swelling  or leg swelling, presyncope, palpitations, abdominal pain/bloating  anorexia, nausea, vomiting, diarrhea  or change in bowel habits or change in bladder habits, change in stools or change in urine, dysuria, hematuria,  rash, arthralgias, visual complaints, headache, numbness, weakness or ataxia or problems with walking or coordination,  change in mood or  memory.        Current Meds  Medication Sig   ACCU-CHEK AVIVA PLUS test strip    albuterol  (PROVENTIL ) (2.5 MG/3ML) 0.083% nebulizer solution Take 3 mLs (2.5 mg total) by nebulization every 4 (four) hours as needed for wheezing or shortness of breath.   albuterol  (VENTOLIN  HFA) 108 (90 Base) MCG/ACT inhaler Inhale 2 puffs into the lungs every 4 (four) hours as needed.   alendronate (FOSAMAX) 70 MG tablet Take 70 mg by mouth.   ALPRAZolam (XANAX) 0.25 MG tablet Take 0.25 mg by mouth daily as needed.   azelastine  (ASTELIN ) 0.1 % nasal spray Place 2 sprays into both nostrils 2 (two) times daily. Use in each nostril as directed   Azelastine -Fluticasone  137-50 MCG/ACT SUSP 2 sprays in each nostril up to twice a day if needed for nasal symtpoms.   Budeson-Glycopyrrol-Formoterol (BREZTRI  AEROSPHERE) 160-9-4.8 MCG/ACT AERO Inhale 2 puffs into the lungs in the morning and at bedtime. (Patient taking differently: Inhale 2 puffs into the lungs in the  morning.)   CALCIUM PO Take 1 tablet by mouth daily.   Continuous Glucose Receiver (FREESTYLE LIBRE 14 DAY READER) DEVI by Does not apply route.   Continuous Glucose Sensor (FREESTYLE LIBRE 3 SENSOR) MISC Change sensor every 14 days   donepezil  (ARICEPT ) 10 MG tablet Take 1 tablet (10 mg total) by mouth at bedtime.   dupilumab  (DUPIXENT ) 300 MG/2ML prefilled syringe Inject 300 mg into the skin every 14 (fourteen) days.   famotidine  (PEPCID ) 20 MG tablet Take 1 tablet (  20 mg total) by mouth daily.   fluticasone  (FLONASE ) 50 MCG/ACT nasal spray Place 2 sprays into both nostrils 2 (two) times daily.   gabapentin (NEURONTIN) 600 MG tablet Take 600 mg by mouth as needed.   HYDROcodone-acetaminophen (NORCO/VICODIN) 5-325 MG tablet Take 1 tablet by mouth every 6 (six) hours as needed.   hydrocortisone  (CORTEF ) 10 MG tablet Take 1 tablet (10 mg total) by mouth as directed. 1.5 tab QAM and half a tab between 2-4 pm   insulin  isophane & regular human KwikPen (NOVOLIN  70/30 KWIKPEN) (70-30) 100 UNIT/ML KwikPen Inject 36 Units into the skin daily before breakfast AND 10 Units daily before supper.   Insulin  Pen Needle 32G X 4 MM MISC 1 Device by Does not apply route in the morning and at bedtime.   ipratropium (ATROVENT ) 0.03 % nasal spray Place one spray per nostril daily (can use up to 3 times daily if needed, but this can be over drying)   lidocaine  (LIDODERM ) 5 % Place 1 patch onto the skin daily. Remove & Discard patch within 12 hours or as directed by MD   lubiprostone  (AMITIZA ) 24 MCG capsule Take 1 capsule (24 mcg total) by mouth 2 (two) times daily with a meal.   metFORMIN  (GLUCOPHAGE -XR) 500 MG 24 hr tablet Take 1 tablet (500 mg total) by mouth 2 (two) times daily.   omeprazole  (PRILOSEC) 40 MG capsule TAKE 1 CAPSULE (40 MG TOTAL) BY MOUTH DAILY.   Potassium 99 MG TABS Take 1 tablet by mouth daily.   Probiotic Product (PROBIOTIC DAILY PO) Take by mouth. One daily   simvastatin (ZOCOR) 20 MG tablet  Take 20 mg by mouth daily.   traZODone (DESYREL) 100 MG tablet Take 100 mg by mouth.   venlafaxine XR (EFFEXOR-XR) 75 MG 24 hr capsule Take 75 mg by mouth daily.   VITAMIN D, CHOLECALCIFEROL, PO Take by mouth daily at 6 (six) AM.   Current Facility-Administered Medications for the 02/03/24 encounter (Office Visit) with Emily Ozell NOVAK, MD  Medication   dupilumab  (DUPIXENT ) prefilled syringe 300 mg                    Past Medical History:  Diagnosis Date   Abnormal gait    Asthma    Chest pain    Hospital, May, 2012,  Pericarditis   COPD (chronic obstructive pulmonary disease) (HCC)    Chronic steroid use   Dyslipidemia    Ejection fraction    Normal, echo, [id number]   GERD (gastroesophageal reflux disease)    History of tobacco abuse    IDDM (insulin  dependent diabetes mellitus)    Morbid obesity (HCC)    OSA (obstructive sleep apnea)    mild/not using C-PAP    Pericardial effusion    Small, echo, circumferential, May, 2012   Pericarditis    Hospitalization, May, 2012   Pneumonia 2010   Tremor         Objective:     Wts  02/03/2024        139  07/16/2023        155   04/14/21 168 lb 9.6 oz (76.5 kg)  02/17/21 165 lb (74.8 kg)  09/06/20 175 lb (79.4 kg)       Amb frustrated to point of anger    HEENT : Oropharynx  clear      Nasal turbinates nl    NECK :  without  apparent JVD/ palpable Nodes/TM    LUNGS: no acc muscle  use,  Nl contour chest with minimal upper airway airway wheeze  CV:  RRR  no s3 or murmur or increase in P2, and no edema   ABD:  soft and nontender though mild tender LUQ s rebound or guarding    MS:  Gait nl   ext warm without deformities Or obvious joint restrictions  calf tenderness, cyanosis or clubbing    SKIN: warm and dry without lesions    NEURO:  alert, approp, nl sensorium with  no motor or cerebellar deficits apparent.    I personally reviewed images and agree with radiology impression as follows:   Chest CTa    01/20/24     No evidence of pulmonary embolism or other acute intrathoracic process.       Assessment     Assessment & Plan DOE (dyspnea on exertion) 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  - referred to Allergy 02/17/2021  With Eos 0.4  IGE 403>  seen 04/14/21  - 02/17/2021   Walked on RA x  2  lap(s) =  approx 300 @ slow pace, stopped due to sob with lowest 02 sats 100%  - 02/17/2021  After extensive coaching inhaler device,  effectiveness =    75% > try breztri  in place of trelegy x 2 week samples and fill rx if helping  -  02/17/21     alpha one AT phenotype  MM  level 142 -  04/07/21  Nl V/Q  - .04/14/21 spirometry s obst p am breztri   -  Spirometry 02/22/23 min airflow obst off  AM breztri  Ok to wean off if tol since doing so well on Dupixient per allergy   Mild flare with purulent bronchitis off breztri  so rec restart Take 2 puffs first thing in am and then another 2 puffs about 12 hour later and rx omnicef  300 mg bid x 10 day(allergy to pcn was rash)  follow up in 2 weeks if not back to baseline  LUQ pain Onset p starting ozempic  d/c July 15/2025 - Multiple neg CT abd and CTa chest since 09/2023 - Ongoing bloating and constipation as of pulmonary eval 02/03/2024 with h/o exquisitely possional LUQ pain best = supine , worst upright  - 02/03/2024 rec trail of gabapentin 600 mg at bedtime and 300 mg qam plus citrucel 1 tbsp bid and IBS diet >>> f/u in 2 weeks if not better   Comment:  See IBS description below - other possibility is pinched T8 spine nerve root or missed rash of H Zoster         Each maintenance medication was reviewed in detail including emphasizing most importantly the difference between maintenance and prns and under what circumstances the prns are to be triggered using an action plan format where appropriate.  Total time for H and P, chart review, counseling, reviewing hfa  device(s) and generating customized AVS unique to this ACUTE office visit  / same day charting = 42 min for pt with   refractory  symptoms of uncertain etiology          Patient Instructions  Gabapentin 600 mg at bedtime  and supplement with hydrocodone and take a half gabapentin in the am   Classic subdiaphragmatic pain pattern suggests ibs:  Stereotypical, migratory with a very limited distribution of pain locations, daytime, not usually exacerbated by exercise  or coughing, worse in sitting position, frequently associated with generalized abd bloating, not as likely to be present supine due to  the dome effect of the diaphragm which  is  canceled in that position. Frequently these patients have had multiple negative CT scans.  Treatment consists of avoiding foods that cause gas (especially boiled eggs, mexcican food but especially  beans and undercooked vegetables like  spinach and some salads)  and citrucel 1 heaping tsp twice daily with a large glass of water .    Resume breztri  Take 2 puffs first thing in am and then another 2 puffs about 12 hours later.   Omnicef  300 mg twice daily x 10 days (risk of penicillin like rash is very low but if have any itching or rash stop it immediately)   Call if not better in 2 weeks         Ozell America, MD 02/03/2024

## 2024-02-03 ENCOUNTER — Ambulatory Visit: Admitting: Internal Medicine

## 2024-02-03 ENCOUNTER — Encounter: Payer: Self-pay | Admitting: Internal Medicine

## 2024-02-03 VITALS — BP 147/87 | HR 101 | Ht 62.0 in | Wt 139.0 lb

## 2024-02-03 DIAGNOSIS — R0609 Other forms of dyspnea: Secondary | ICD-10-CM

## 2024-02-03 DIAGNOSIS — R1012 Left upper quadrant pain: Secondary | ICD-10-CM

## 2024-02-03 MED ORDER — CEFDINIR 300 MG PO CAPS: 300.0000 mg | ORAL_CAPSULE | Freq: Two times a day (BID) | ORAL | 0 refills | Status: DC

## 2024-02-03 NOTE — Assessment & Plan Note (Addendum)
 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  - referred to Allergy 02/17/2021  With Eos 0.4  IGE 403>  seen 04/14/21  - 02/17/2021   Walked on RA x  2  lap(s) =  approx 300 @ slow pace, stopped due to sob with lowest 02 sats 100%  - 02/17/2021  After extensive coaching inhaler device,  effectiveness =    75% > try breztri  in place of trelegy x 2 week samples and fill rx if helping  -  02/17/21     alpha one AT phenotype  MM  level 142 -  04/07/21  Nl V/Q  - .04/14/21 spirometry s obst p am breztri   -  Spirometry 02/22/23 min airflow obst off  AM breztri  Ok to wean off if tol since doing so well on Dupixient per allergy   Mild flare with purulent bronchitis off breztri  so rec restart Take 2 puffs first thing in am and then another 2 puffs about 12 hour later and rx omnicef  300 mg bid x 10 day(allergy to pcn was rash)  follow up in 2 weeks if not back to baseline

## 2024-02-03 NOTE — Assessment & Plan Note (Addendum)
 Onset p starting ozempic  d/c July 15/2025 - Multiple neg CT abd and CTa chest since 09/2023 - Ongoing bloating and constipation as of pulmonary eval 02/03/2024 with h/o exquisitely possional LUQ pain best = supine , worst upright  - 02/03/2024 rec trail of gabapentin 600 mg at bedtime and 300 mg qam plus citrucel 1 tbsp bid and IBS diet >>> f/u in 2 weeks if not better   Comment:  See IBS description below - other possibility is pinched T8 spine nerve root or missed rash of H Zoster         Each maintenance medication was reviewed in detail including emphasizing most importantly the difference between maintenance and prns and under what circumstances the prns are to be triggered using an action plan format where appropriate.  Total time for H and P, chart review, counseling, reviewing hfa  device(s) and generating customized AVS unique to this ACUTE office visit / same day charting = 42 min for pt with   refractory  symptoms of uncertain etiology

## 2024-02-03 NOTE — Patient Instructions (Addendum)
 Gabapentin 600 mg at bedtime  and supplement with hydrocodone and take a half gabapentin in the am   Classic subdiaphragmatic pain pattern suggests ibs:  Stereotypical, migratory with a very limited distribution of pain locations, daytime, not usually exacerbated by exercise  or coughing, worse in sitting position, frequently associated with generalized abd bloating, not as likely to be present supine due to the dome effect of the diaphragm which  is  canceled in that position. Frequently these patients have had multiple negative CT scans.  Treatment consists of avoiding foods that cause gas (especially boiled eggs, mexcican food but especially  beans and undercooked vegetables like  spinach and some salads)  and citrucel 1 heaping tsp twice daily with a large glass of water .    Resume breztri  Take 2 puffs first thing in am and then another 2 puffs about 12 hours later.   Omnicef  300 mg twice daily x 10 days (risk of penicillin like rash is very low but if have any itching or rash stop it immediately)   Call if not better in 2 weeks

## 2024-02-06 ENCOUNTER — Telehealth: Payer: Self-pay | Admitting: Dietician

## 2024-02-06 NOTE — Telephone Encounter (Signed)
 Returned patient call. She states that she stopped the Ozempic  12/31/2023 after discussing with her gastroenterologist due to GI symptoms.  Since being off the Ozempic , she is not sure if it was actually the Ozempic  that caused her concern.  She is concerned that her blood glucose went to 200 today after breakfast today and has been a little higher overall. Today she ate oatmeal and a banana. She is taking Novolin  insulin  26 units in the am and 10 units q HS.    She wears the [facility] and reports fasting sensor readings of 100-110 and around 120 at HS. Time above 180 is 7%.  Noted that she has an appointment to see Dr. Sam on 9/11.   Instructed her to be mindful about what she eats and always have a good protein source with each meal.  Instructed her to monitor the responses of her meals.  Leita Constable, RD, LDN, CDCES, DipACLM

## 2024-02-10 NOTE — Telephone Encounter (Signed)
 error

## 2024-02-26 ENCOUNTER — Ambulatory Visit: Admitting: Allergy & Immunology

## 2024-02-27 ENCOUNTER — Encounter: Payer: Self-pay | Admitting: Internal Medicine

## 2024-02-27 ENCOUNTER — Ambulatory Visit: Admitting: Internal Medicine

## 2024-02-27 VITALS — BP 120/74 | HR 87 | Ht 62.0 in | Wt 145.0 lb

## 2024-02-27 DIAGNOSIS — E2749 Other adrenocortical insufficiency: Secondary | ICD-10-CM

## 2024-02-27 DIAGNOSIS — E1142 Type 2 diabetes mellitus with diabetic polyneuropathy: Secondary | ICD-10-CM

## 2024-02-27 DIAGNOSIS — Z794 Long term (current) use of insulin: Secondary | ICD-10-CM

## 2024-02-27 DIAGNOSIS — R1012 Left upper quadrant pain: Secondary | ICD-10-CM

## 2024-02-27 LAB — POCT GLYCOSYLATED HEMOGLOBIN (HGB A1C): Hemoglobin A1C: 6.5 % — AB (ref 4.0–5.6)

## 2024-02-27 NOTE — Progress Notes (Signed)
 Name: Emily Humphrey  MRN/ DOB: [id number], [birth year]   Age/ Sex: 73 y.o., female    PCP: Dow Longs, PA-C   Reason for Endocrinology Evaluation: Type 2 Diabetes Mellitus     Date of Initial Endocrinology Visit: 07/16/2022    PATIENT IDENTIFIER: Emily Humphrey is a 73 y.o. female with a past medical history of DM, dyslipidemia, COPD, . The patient presented for initial endocrinology clinic visit on 07/16/2022  for consultative assistance with her diabetes management.    HPI:   Diagnosed with DM > 20 yrs   Prior Medications tried/Intolerance: Jardiance- yeast infection Hemoglobin A1c has ranged from 7.5% in 2023, peaking at 11.0% in the past .   Saw  Dr. Tommas in the past     She is on chronic prednisone  for Asthma   Took one dose of Ozempic   but PCP advised to stop it as she had low BP and not feeling well at the time  On her initial visit to our clinic she had an A1c of 8.5%, she was on metformin  and Novolin  mix, I decreased her metformin  due to reported diarrhea and adjusted her insulin    Discontinued Ozempic  by July, 2025 due to left upper quadrant pain, it was later determined that the pain is not related to Ozempic , but we did opt to hold off on Ozempic  during her visit in September, 2025     SECONDARY ADRENAL INSUFFICIENCY: The patient was on chronic glucocorticoid therapy for asthma-COPD overlap syndrome..  Per Dr. Laney recommendations we started working on weaning the patient off glucocorticoid therapy.  I switched from prednisone  to hydrocortisone  05/2023  ACTH  has been normal at 49 PG/mL in  05/2023  SUBJECTIVE:   During the last visit (10/23/2023): A1c 6.6%  Today (02/27/24): Emily Humphrey is here for follow-up on diabetes management.  She  checks her blood sugars multiple  times daily. The patient has had hypoglycemic episodes since the last clinic visit. The patient is symptomatic with these episodes.  She contacted our office with hypoglycemia and her  insulin  regimen was decreased to the on-call provider   Patient follows with GI for IBS- C , she follows with gastroenterology She had a recent ED presentation for left upper quadrant pain, imaging were unrevealing. She continues with left upper abdominal pain , on lidocaine  pain patches, Gabapentin, hydrocodone, has a follow up with neurology  in the near future, has a follow up with spin specialist at this time , sensitive to the touch   No recent constipation or diarrhea  No recent nausea  Has some dizziness due to multiple pain meds    HOME DIABETES REGIMEN: Metformin  500 mg XR 1 tabs  BID  Novolin  Mix 26 units with breakfast and 10 units before Supper  Hydrocortisone  10 mg, 1.5 tablets with breakfast and half a tablet in the afternoon between 2-4 PM     Statin: yes ACE-I/ARB: no    CONTINUOUS GLUCOSE MONITORING RECORD INTERPRETATION    Dates of Recording: 8/29-9/04/2024  Sensor description: [employer]  Results statistics:   CGM use % of time 95  Average and SD 136/33.7  Time in range 81 %  % Time Above 180 16  % Time above 250 1  % Time Below target 2   Glycemic patterns summary: BGs are optimal overnight and between the meals  Hyperglycemic episodes post prandial  Hypoglycemic episodes occurred at variable times  Overnight periods: Trends down   DIABETIC COMPLICATIONS: Microvascular complications:  Neuropathy  Denies: CKD  Last eye exam: Completed 06/2021  Macrovascular complications:   Denies: CAD, PVD, CVA   PAST HISTORY: Past Medical History:  Past Medical History:  Diagnosis Date   Abnormal gait    Asthma    Chest pain    Hospital, May, 2012,  Pericarditis   COPD (chronic obstructive pulmonary disease) (HCC)    Chronic steroid use   Dyslipidemia    Ejection fraction    Normal, echo, [id number]   GERD (gastroesophageal reflux disease)    History of tobacco abuse    IDDM (insulin  dependent diabetes mellitus)    Morbid obesity (HCC)     OSA (obstructive sleep apnea)    mild/not using C-PAP    Pericardial effusion    Small, echo, circumferential, May, 2012   Pericarditis    Hospitalization, May, 2012   Pneumonia 2010   Tremor    Past Surgical History:  Past Surgical History:  Procedure Laterality Date   ABDOMINAL HYSTERECTOMY     BIOPSY  08/13/2023   Procedure: BIOPSY;  Surgeon: Cinderella Deatrice FALCON, MD;  [facility];  Service: Endoscopy;;   COLONOSCOPY WITH PROPOFOL  N/A 08/13/2023   Procedure: COLONOSCOPY WITH PROPOFOL ;  Surgeon: Cinderella Deatrice FALCON, MD;  [facility]: [facility];  Service: Endoscopy;  Laterality: N/A;  9:00AM;ASA 2   ESOPHAGOGASTRODUODENOSCOPY (EGD) WITH PROPOFOL  N/A 08/13/2023   Procedure: ESOPHAGOGASTRODUODENOSCOPY (EGD) WITH PROPOFOL ;  Surgeon: Cinderella Deatrice FALCON, MD;  [facility]: [facility];  Service: Endoscopy;  Laterality: N/A;  9:00AM;ASA 2   NASAL SINUS SURGERY     POLYPECTOMY  08/13/2023   Procedure: POLYPECTOMY;  Surgeon: Cinderella Deatrice FALCON, MD;  Location: [facility];  Service: Endoscopy;;   Removal of throat nodules     vocal cored nodules    Social History:  reports that she quit smoking about 29 years ago. Her smoking use included cigarettes. She started smoking about 49 years ago. She has a 20 pack-year smoking history. She has been exposed to tobacco smoke. She has never used smokeless tobacco. She reports that she does not currently use alcohol . She reports that she does not use drugs. Family History:  Family History  Problem Relation Age of Onset   Heart attack Mother        deceased at age 47   Asthma Mother    Other Father        deceased at age 32   Diabetes Sister      HOME MEDICATIONS: Allergies as of 02/27/2024       Reactions   Atorvastatin    Other reaction(s): Muscle Pain   Empagliflozin    Other reaction(s): yeast   Penicillins Itching   hives   Sulfa Antibiotics Itching   rash        Medication List        Accurate as of February 27, 2024  11:04 AM. If you have any questions, ask your nurse or doctor.          Accu-Chek Aviva Plus test strip Generic drug: glucose blood   albuterol  108 (90 Base) MCG/ACT inhaler Commonly known as: VENTOLIN  HFA Inhale 2 puffs into the lungs every 4 (four) hours as needed.   albuterol  (2.5 MG/3ML) 0.083% nebulizer solution Commonly known as: PROVENTIL  Take 3 mLs (2.5 mg total) by nebulization every 4 (four) hours as needed for wheezing or shortness of breath.   alendronate 70 MG tablet Commonly known as: FOSAMAX Take 70 mg by mouth.   ALPRAZolam 0.25 MG tablet Commonly  known as: XANAX Take 0.25 mg by mouth daily as needed.   azelastine  0.1 % nasal spray Commonly known as: ASTELIN  Place 2 sprays into both nostrils 2 (two) times daily. Use in each nostril as directed   Azelastine -Fluticasone  137-50 MCG/ACT Susp 2 sprays in each nostril up to twice a day if needed for nasal symtpoms.   Breztri  Aerosphere 160-9-4.8 MCG/ACT Aero inhaler Generic drug: budesonide-glycopyrrolate -formoterol Inhale 2 puffs into the lungs in the morning and at bedtime. What changed: when to take this   CALCIUM PO Take 1 tablet by mouth daily.   cefdinir  300 MG capsule Commonly known as: OMNICEF  Take 1 capsule (300 mg total) by mouth 2 (two) times daily.   donepezil  10 MG tablet Commonly known as: ARICEPT  Take 1 tablet (10 mg total) by mouth at bedtime.   Dupixent  300 MG/2ML prefilled syringe Generic drug: dupilumab  Inject 300 mg into the skin every 14 (fourteen) days.   famotidine  20 MG tablet Commonly known as: PEPCID  Take 1 tablet (20 mg total) by mouth daily.   fluticasone  50 MCG/ACT nasal spray Commonly known as: FLONASE  Place 2 sprays into both nostrils 2 (two) times daily.   FreeStyle Libre 14 Day Reader Espiridion by Does not apply route.   FreeStyle Libre 3 Sensor Misc Change sensor every 14 days   gabapentin 600 MG tablet Commonly known as: NEURONTIN Take 600 mg by mouth as  needed.   HYDROcodone-acetaminophen 5-325 MG tablet Commonly known as: NORCO/VICODIN Take 1 tablet by mouth every 6 (six) hours as needed.   hydrocortisone  10 MG tablet Commonly known as: CORTEF  Take 1 tablet (10 mg total) by mouth as directed. 1.5 tab QAM and half a tab between 2-4 pm   Insulin  Pen Needle 32G X 4 MM Misc 1 Device by Does not apply route in the morning and at bedtime.   ipratropium 0.03 % nasal spray Commonly known as: ATROVENT  Place one spray per nostril daily (can use up to 3 times daily if needed, but this can be over drying)   lidocaine  5 % Commonly known as: Lidoderm  Place 1 patch onto the skin daily. Remove & Discard patch within 12 hours or as directed by MD   lubiprostone  24 MCG capsule Commonly known as: Amitiza  Take 1 capsule (24 mcg total) by mouth 2 (two) times daily with a meal.   metFORMIN  500 MG 24 hr tablet Commonly known as: GLUCOPHAGE -XR Take 1 tablet (500 mg total) by mouth 2 (two) times daily.   NovoLIN  70/30 Kwikpen (70-30) 100 UNIT/ML KwikPen Generic drug: insulin  isophane & regular human KwikPen Inject 36 Units into the skin daily before breakfast AND 10 Units daily before supper.   omeprazole  40 MG capsule Commonly known as: PRILOSEC TAKE 1 CAPSULE (40 MG TOTAL) BY MOUTH DAILY.   Potassium 99 MG Tabs Take 1 tablet by mouth daily.   PROBIOTIC DAILY PO Take by mouth. One daily   simvastatin 20 MG tablet Commonly known as: ZOCOR Take 20 mg by mouth daily.   traZODone 100 MG tablet Commonly known as: DESYREL Take 100 mg by mouth.   venlafaxine XR 75 MG 24 hr capsule Commonly known as: EFFEXOR-XR Take 75 mg by mouth daily.   VITAMIN D (CHOLECALCIFEROL) PO Take by mouth daily at 6 (six) AM.         ALLERGIES: Allergies  Allergen Reactions   Atorvastatin     Other reaction(s): Muscle Pain   Empagliflozin     Other reaction(s): yeast   Penicillins  Itching    hives   Sulfa Antibiotics Itching    rash      REVIEW OF SYSTEMS: A comprehensive ROS was conducted with the patient and is negative except as per HPI     OBJECTIVE:   VITAL SIGNS: BP 120/74 (BP Location: Left Arm, Patient Position: Sitting, Cuff Size: Normal)   Pulse 87   Ht 5' 2 (1.575 m)   Wt 145 lb (65.8 kg)   SpO2 98%   BMI 26.52 kg/m    Filed Weights   02/27/24 1101  Weight: 145 lb (65.8 kg)      PHYSICAL EXAM:  General: Pt appears well and is in NAD  Neck: General: Supple without adenopathy or carotid bruits. Thyroid : Thyroid  size normal.  No goiter or nodules appreciated.   Lungs: Clear with good BS bilat with no rales, rhonchi, or wheezes  Heart: RRR   Extremities:  Lower extremities - No pretibial edema.   Neuro: MS is good with appropriate affect, pt is alert and Ox3    DM foot exam: 10/23/2023   The skin of the feet is intact without sores or ulcerations. The pedal pulses are 2+ on right and 2+ on left. The sensation is decreased  to a screening 5.07, 10 gram monofilament at the great toe   DATA REVIEWED:  Lab Results  Component Value Date   HGBA1C 6.5 (A) 02/27/2024   HGBA1C 6.6 (A) 10/23/2023   HGBA1C 6.2 (A) 06/03/2023       Latest Reference Range & Units 01/20/24 09:20  Sodium 135 - 145 mmol/L 141  Potassium 3.5 - 5.1 mmol/L 3.6  Chloride 98 - 111 mmol/L 109  CO2 22 - 32 mmol/L 22  Glucose 70 - 99 mg/dL 92  BUN 8 - 23 mg/dL 9  Creatinine 9.55 - 8.99 mg/dL 9.22  Calcium 8.9 - 89.6 mg/dL 9.3  Anion gap 5 - 15  10  Alkaline Phosphatase 38 - 126 U/L 71  Albumin  3.5 - 5.0 g/dL 4.1  Lipase 11 - 51 U/L 33  AST 15 - 41 U/L 24  ALT 0 - 44 U/L 14  Total Protein 6.5 - 8.1 g/dL 7.9  Total Bilirubin 0.0 - 1.2 mg/dL 0.8  GFR, Estimated >39 mL/min >60    ASSESSMENT / PLAN / RECOMMENDATIONS:   1) Type 2 Diabetes Mellitus,  Optimally controlled, With neuropathic  complications - Most recent A1c of 6.5 %. Goal A1c < 7.0 %.    -Historically she has endorsed chronic diarrhea that she  attributed to metformin , after reducing metformin  by 50%, she has noted dramatic improvement to the diarrhea - Has intolerance to Jardiance due to recurrent yeast infections - She did develop left upper quadrant pain that initially she thought was related to Ozempic , but now she believes this was an neuropathic pain, I did advise the patient to hold off on restarting Ozempic  at this time until left upper quadrant pain has been managed properly - Patient does confirm that hypoglycemic episodes on freestyle libre are false and that her BG readings are 107 MGs/DL on a fingerstick when her BG reading on freestyle libre 65 MGs/DL - No changes   MEDICATIONS: Continue metformin  500 mg XR 1  tab BID  Continue Novolin  Mix to 26 units before breakfast and 10 units before supper   EDUCATION / INSTRUCTIONS: BG monitoring instructions: Patient is instructed to check her blood sugars 2 times a day. Call [facility] if: BG persistently < 70  I reviewed  the Rule of 15 for the treatment of hypoglycemia in detail with the patient. Literature supplied.   2) Diabetic complications:  Eye: Does not have known diabetic retinopathy.  Neuro/ Feet: Does  have known diabetic peripheral neuropathy. Renal: Patient does not have known baseline CKD. She is not on an ACEI/ARB at present.   3) Secondary Adrenal Insufficiency:   -She has been on chronic glucocorticoid therapy for asthma-COPD overlap -She has been on prednisone  5 mg since September 2024 - I switched from prednisone  to hydrocortisone  in 05/2023 -ACTH  has been normal - Fasting, 8 AM cortisol while holding hydrocortisone  dose the prior afternoon was low at 5.5 mcg/DL, we opted to continue hydrocortisone    Medication hydrocortisone  10 mg, 1.5 tablets with breakfast and half a tablet in the afternoon between 2-4 PM   4) LUQ pain :  -Imaging studies have been normal as well as lipase - It is believed that this may be neuropathic,  patient will follow-up with her spine specialist - She also has an appointment with neurology   Follow-up in 6 months   Signed electronically by: Stefano Redgie Butts, MD  [facility]  [facility] [address] [place] [place], [place] [address] Phone: [phone] FAX: [phone]   CC: Dow Rocky RIGGERS [address] [place] [address] Phone: [phone]  Fax: [phone]    Return to Endocrinology clinic as below: Future Appointments  Date Time Provider Department Center  02/27/2024 11:10 AM Lamija Besse, Donell Redgie, MD [id number] None  02/28/2024 10:00 AM Iva Marty Saltness, MD [facility] None  04/21/2024  1:00 PM Onita Duos, MD [facility] None

## 2024-02-27 NOTE — Patient Instructions (Addendum)
   Continue Metformin  500 mg XR 1 tablet before Breakfast and 1 tablet before Supper  Continue Novolin  Mix 26  units before  breakfast and 10 units before Supper      ADRENAL INSUFFICIENCY SICK DAY RULES:  Should you face an extreme emotional or physical stress such as trauma, surgery or acute illness, this will require extra steroid coverage so that the body can meet that stress.   Without increasing the steroid dose you may experience severe weakness, headache, dizziness, nausea and vomiting and possibly a more serious deterioration in health.  Typically the dose of steroids will only need to be increased for a couple of days if you have an illness that is transient and managed in the community.   If you are unable to take/absorb an increased dose of steroids orally because of vomiting or diarrhea, you will urgently require steroid injections and should present to an Emergency Department.  The general advice for any serious illness is as follows: Double the normal daily steroid dose for up to 3 days if you have a temperature of more than 37.50C (99.21F) with signs of sickness, or severe emotional or physical distress Contact your primary care doctor and Endocrinologist if the illness worsens or it lasts for more than 3 days.  In cases of severe illness, urgent medical assistance should be promptly sought. If you experience vomiting/diarrhea or are unable to take steroids by mouth, please administer the Hydrocortisone  injection kit and seek urgent medical help.     HOW TO TREAT LOW BLOOD SUGARS (Blood sugar LESS THAN 70 MG/DL) Please follow the RULE OF 15 for the treatment of hypoglycemia treatment (when your (blood sugars are less than 70 mg/dL)   STEP 1: Take 15 grams of carbohydrates when your blood sugar is low, which includes:  3-4 GLUCOSE TABS  OR 3-4 OZ OF JUICE OR REGULAR SODA OR ONE TUBE OF GLUCOSE GEL    STEP 2: RECHECK blood sugar in 15 MINUTES STEP 3: If your blood  sugar is still low at the 15 minute recheck --> then, go back to STEP 1 and treat AGAIN with another 15 grams of carbohydrates.

## 2024-02-28 ENCOUNTER — Ambulatory Visit: Payer: Self-pay | Admitting: Internal Medicine

## 2024-02-28 ENCOUNTER — Ambulatory Visit: Admitting: Allergy & Immunology

## 2024-02-28 DIAGNOSIS — G588 Other specified mononeuropathies: Secondary | ICD-10-CM | POA: Diagnosis not present

## 2024-02-28 DIAGNOSIS — M5414 Radiculopathy, thoracic region: Secondary | ICD-10-CM | POA: Diagnosis not present

## 2024-02-28 LAB — MICROALBUMIN / CREATININE URINE RATIO
Creatinine, Urine: 122 mg/dL (ref 20–275)
Microalb Creat Ratio: 37 mg/g{creat} — ABNORMAL HIGH (ref <30)
Microalb, Ur: 4.5 mg/dL

## 2024-03-04 DIAGNOSIS — G588 Other specified mononeuropathies: Secondary | ICD-10-CM | POA: Diagnosis not present

## 2024-03-09 ENCOUNTER — Telehealth: Payer: Self-pay

## 2024-03-09 NOTE — Telephone Encounter (Signed)
 Patient states that she took a steroid injection on last Wednesday and her BS have been in the high 100s fasting and 250-300 range in the afternoon. She has been using the bathroom a lot

## 2024-03-09 NOTE — Telephone Encounter (Signed)
 Patient notified and verbalized understanding medication changes.

## 2024-03-10 DIAGNOSIS — R0683 Snoring: Secondary | ICD-10-CM | POA: Diagnosis not present

## 2024-03-12 DIAGNOSIS — M47814 Spondylosis without myelopathy or radiculopathy, thoracic region: Secondary | ICD-10-CM | POA: Diagnosis not present

## 2024-03-17 ENCOUNTER — Other Ambulatory Visit: Payer: Self-pay | Admitting: Family Medicine

## 2024-03-17 DIAGNOSIS — Z23 Encounter for immunization: Secondary | ICD-10-CM | POA: Diagnosis not present

## 2024-03-20 ENCOUNTER — Telehealth: Payer: Self-pay

## 2024-03-20 NOTE — Telephone Encounter (Signed)
 Patient notified and will make medication changes.

## 2024-03-20 NOTE — Telephone Encounter (Signed)
 Patient had a nerve block three weeks ago and she has been eating non stop. BS have been in upper 300 throughout the day. Fasting this morning was 156. She has been doing  30 units of insulin  in the morning and 10 units in the evening.

## 2024-03-25 ENCOUNTER — Other Ambulatory Visit: Payer: Self-pay

## 2024-03-25 MED ORDER — FREESTYLE LIBRE 3 PLUS SENSOR MISC: 3 refills | Status: AC

## 2024-04-07 DIAGNOSIS — G4733 Obstructive sleep apnea (adult) (pediatric): Secondary | ICD-10-CM | POA: Diagnosis not present

## 2024-04-09 ENCOUNTER — Other Ambulatory Visit (INDEPENDENT_AMBULATORY_CARE_PROVIDER_SITE_OTHER): Payer: Self-pay | Admitting: Gastroenterology

## 2024-04-16 DIAGNOSIS — G4733 Obstructive sleep apnea (adult) (pediatric): Secondary | ICD-10-CM | POA: Diagnosis not present

## 2024-04-21 ENCOUNTER — Telehealth: Payer: Self-pay | Admitting: Nutrition

## 2024-04-21 ENCOUNTER — Institutional Professional Consult (permissible substitution): Admitting: Neurology

## 2024-04-21 NOTE — Telephone Encounter (Signed)
 Message left on my machine yesterday afternoon.  Pt. Saying her blood sugars are still very high, her gastroenterologist took her off her Ozempic  and she would like to go back on.  Her next appointment with Dr. Sam is in March and feels like she needs to be seen since blood sugar readings are very high -- in the 200s-300s.   Please advise

## 2024-04-23 NOTE — Progress Notes (Unsigned)
 Name: Emily Humphrey  MRN/ DOB: [id number], [birth year]   Age/ Sex: 73 y.o., female    PCP: Dow Longs, PA-C   Reason for Endocrinology Evaluation: Type 2 Diabetes Mellitus     Date of Initial Endocrinology Visit: 07/16/2022    PATIENT IDENTIFIER: Emily Humphrey is a 73 y.o. female with a past medical history of DM, dyslipidemia, COPD, . The patient presented for initial endocrinology clinic visit on 07/16/2022  for consultative assistance with her diabetes management.    HPI:   Diagnosed with DM > 20 yrs   Prior Medications tried/Intolerance: Jardiance- yeast infection Hemoglobin A1c has ranged from 7.5% in 2023, peaking at 11.0% in the past .   Saw  Dr. Tommas in the past     She is on chronic prednisone  for Asthma   Took one dose of Ozempic   but PCP advised to stop it as she had low BP and not feeling well at the time  On her initial visit to our clinic she had an A1c of 8.5%, she was on metformin  and Novolin  mix, I decreased her metformin  due to reported diarrhea and adjusted her insulin    Discontinued Ozempic  by July, 2025 due to left upper quadrant pain, it was later determined that the pain is not related to Ozempic , but we did opt to hold off on Ozempic  during her visit in September, 2025  Ozempic  was restarted in November, 2025 after she was diagnosed with left upper quadrant neuralgia   SECONDARY ADRENAL INSUFFICIENCY: The patient was on chronic glucocorticoid therapy for asthma-COPD overlap syndrome..  Per Dr. Laney recommendations we started working on weaning the patient off glucocorticoid therapy.  I switched from prednisone  to hydrocortisone  05/2023  ACTH  has been normal at 49 PG/mL in  05/2023  SUBJECTIVE:   During the last visit (02/27/2024): A1c 6.5%  Today (04/24/24): Emily Humphrey is here for follow-up on diabetes management.  She  checks her blood sugars multiple  times daily. The patient has had hypoglycemic episodes since the last clinic visit. The  patient is symptomatic with these episodes.   The follows with [facility] for intercostal neuralgia, the patient did receive Kenalog injection on 03/04/2024 which probably has resulted in hyperglycemia . The pain is dramatically improved     Patient follows with GI for IBS- C , she follows with gastroenterology Per patient, Ozempic  was held through GI, and since then has noted hyperglycemia  Patient would urine sample with her and she would like to get tested No nausea or vomiting  No constipation or diarrhea  Has urinary frequency    HOME DIABETES REGIMEN: Metformin  500 mg XR 1 tabs  BID  Novolin  Mix 35 units with breakfast and 10 units before Supper  Hydrocortisone  10 mg, 1.5 tablets with breakfast and half a tablet in the afternoon between 2-4 PM     Statin: yes ACE-I/ARB: no    CONTINUOUS GLUCOSE MONITORING RECORD INTERPRETATION    Dates of Recording: 10/24 - 04/23/2024  Sensor description: [employer]  Results statistics:   CGM use % of time 97  Average and SD 179/29.7  Time in range 55 %  % Time Above 180 32  % Time above 250 12  % Time Below target 1   Glycemic patterns summary: BGs trend down to optimal overnight and increased throughout the day Hyperglycemic episodes post prandial  Hypoglycemic episodes occurred without a pattern but mostly during the day  Overnight periods: Optimal   DIABETIC COMPLICATIONS: Microvascular  complications:  Neuropathy  Denies: CKD  Last eye exam: Completed 06/2021  Macrovascular complications:   Denies: CAD, PVD, CVA   PAST HISTORY: Past Medical History:  Past Medical History:  Diagnosis Date   Abnormal gait    Asthma    Chest pain    Hospital, May, 2012,  Pericarditis   COPD (chronic obstructive pulmonary disease) (HCC)    Chronic steroid use   Dyslipidemia    Ejection fraction    Normal, echo, [id number]   GERD (gastroesophageal reflux disease)    History of tobacco abuse     IDDM (insulin  dependent diabetes mellitus)    Morbid obesity (HCC)    OSA (obstructive sleep apnea)    mild/not using C-PAP    Pericardial effusion    Small, echo, circumferential, May, 2012   Pericarditis    Hospitalization, May, 2012   Pneumonia 2010   Tremor    Past Surgical History:  Past Surgical History:  Procedure Laterality Date   ABDOMINAL HYSTERECTOMY     BIOPSY  08/13/2023   Procedure: BIOPSY;  Surgeon: Cinderella Deatrice FALCON, MD;  [facility];  Service: Endoscopy;;   COLONOSCOPY WITH PROPOFOL  N/A 08/13/2023   Procedure: COLONOSCOPY WITH PROPOFOL ;  Surgeon: Cinderella Deatrice FALCON, MD;  [facility];  Service: Endoscopy;  Laterality: N/A;  9:00AM;ASA 2   ESOPHAGOGASTRODUODENOSCOPY (EGD) WITH PROPOFOL  N/A 08/13/2023   Procedure: ESOPHAGOGASTRODUODENOSCOPY (EGD) WITH PROPOFOL ;  Surgeon: Cinderella Deatrice FALCON, MD;  [facility]: [facility];  Service: Endoscopy;  Laterality: N/A;  9:00AM;ASA 2   NASAL SINUS SURGERY     POLYPECTOMY  08/13/2023   Procedure: POLYPECTOMY;  Surgeon: Cinderella Deatrice FALCON, MD;  Location: [facility];  Service: Endoscopy;;   Removal of throat nodules     vocal cored nodules    Social History:  reports that she quit smoking about 29 years ago. Her smoking use included cigarettes. She started smoking about 49 years ago. She has a 20 pack-year smoking history. She has been exposed to tobacco smoke. She has never used smokeless tobacco. She reports that she does not currently use alcohol . She reports that she does not use drugs. Family History:  Family History  Problem Relation Age of Onset   Heart attack Mother        deceased at age 40   Asthma Mother    Other Father        deceased at age 28   Diabetes Sister      HOME MEDICATIONS: Allergies as of 04/24/2024       Reactions   Atorvastatin    Other reaction(s): Muscle Pain   Empagliflozin    Other reaction(s): yeast   Penicillins Itching   hives   Sulfa Antibiotics Itching   rash         Medication List        Accurate as of April 24, 2024  1:11 PM. If you have any questions, ask your nurse or doctor.          STOP taking these medications    NovoLIN  70/30 Kwikpen (70-30) 100 UNIT/ML KwikPen Generic drug: insulin  isophane & regular human KwikPen Stopped by: Caiya Bettes J Edia Pursifull       TAKE these medications    Accu-Chek Aviva Plus test strip Generic drug: glucose blood   albuterol  108 (90 Base) MCG/ACT inhaler Commonly known as: VENTOLIN  HFA Inhale 2 puffs into the lungs every 4 (four) hours as needed.   albuterol  (2.5 MG/3ML) 0.083% nebulizer  solution Commonly known as: PROVENTIL  Take 3 mLs (2.5 mg total) by nebulization every 4 (four) hours as needed for wheezing or shortness of breath.   alendronate 70 MG tablet Commonly known as: FOSAMAX Take 70 mg by mouth.   ALPRAZolam 0.25 MG tablet Commonly known as: XANAX Take 0.25 mg by mouth daily as needed.   Azelastine  HCl 137 MCG/SPRAY Soln INSTILL 2 SPRAYS IN EACH NOSTRIL TWICE DAILY AS DIRECTED   Azelastine -Fluticasone  137-50 MCG/ACT Susp 2 sprays in each nostril up to twice a day if needed for nasal symtpoms.   Breztri  Aerosphere 160-9-4.8 MCG/ACT Aero inhaler Generic drug: budesonide-glycopyrrolate -formoterol Inhale 2 puffs into the lungs in the morning and at bedtime. What changed: when to take this   CALCIUM PO Take 1 tablet by mouth daily.   cefdinir  300 MG capsule Commonly known as: OMNICEF  Take 1 capsule (300 mg total) by mouth 2 (two) times daily.   donepezil  10 MG tablet Commonly known as: ARICEPT  Take 1 tablet (10 mg total) by mouth at bedtime.   Dupixent  300 MG/2ML prefilled syringe Generic drug: dupilumab  Inject 300 mg into the skin every 14 (fourteen) days.   famotidine  20 MG tablet Commonly known as: PEPCID  Take 1 tablet (20 mg total) by mouth daily.   fluticasone  50 MCG/ACT nasal spray Commonly known as: FLONASE  INSTILL 2 SPRAYS IN EACH NOSTRIL TWICE A  DAY   FreeStyle Libre 14 Day Reader Espiridion by Does not apply route.   FreeStyle Libre 3 Sensor Misc Change sensor every 14 days   FreeStyle Libre 3 Plus Sensor Misc Change sensor every 15 days.   gabapentin 600 MG tablet Commonly known as: NEURONTIN Take 600 mg by mouth as needed.   HYDROcodone-acetaminophen 5-325 MG tablet Commonly known as: NORCO/VICODIN Take 1 tablet by mouth every 6 (six) hours as needed.   hydrocortisone  10 MG tablet Commonly known as: CORTEF  Take 1 tablet (10 mg total) by mouth as directed. 1.5 tab QAM and half a tab between 2-4 pm   insulin  glargine 100 UNIT/ML Solostar Pen Commonly known as: LANTUS Inject 30 Units into the skin daily. Started by: Janice Bodine J Ercia Crisafulli   Insulin  Pen Needle 32G X 4 MM Misc 1 Device by Does not apply route in the morning, at noon, in the evening, and at bedtime. What changed: when to take this Changed by: Donell PARAS Daran Favaro   ipratropium 0.03 % nasal spray Commonly known as: ATROVENT  Place one spray per nostril daily (can use up to 3 times daily if needed, but this can be over drying)   lidocaine  5 % Commonly known as: Lidoderm  Place 1 patch onto the skin daily. Remove & Discard patch within 12 hours or as directed by MD   lubiprostone  24 MCG capsule Commonly known as: Amitiza  Take 1 capsule (24 mcg total) by mouth 2 (two) times daily with a meal.   metFORMIN  500 MG 24 hr tablet Commonly known as: GLUCOPHAGE -XR Take 1 tablet (500 mg total) by mouth 2 (two) times daily.   NovoLOG FlexPen 100 UNIT/ML FlexPen Generic drug: insulin  aspart Inject 6 Units into the skin 3 (three) times daily with meals. Started by: Kelijah Towry J Lavina Resor   omeprazole  40 MG capsule Commonly known as: PRILOSEC TAKE 1 CAPSULE (40 MG TOTAL) BY MOUTH DAILY.   Ozempic  (0.25 or 0.5 MG/DOSE) 2 MG/3ML Sopn Generic drug: Semaglutide (0.25 or 0.5MG /DOS) Inject 0.5 mg into the skin once a week. Started by: Donell PARAS Hyde Sires   Potassium  99 MG Tabs Take 1  tablet by mouth daily.   PROBIOTIC DAILY PO Take by mouth. One daily   simvastatin 20 MG tablet Commonly known as: ZOCOR Take 20 mg by mouth daily.   traZODone 100 MG tablet Commonly known as: DESYREL Take 100 mg by mouth.   venlafaxine XR 75 MG 24 hr capsule Commonly known as: EFFEXOR-XR Take 75 mg by mouth daily.   VITAMIN D (CHOLECALCIFEROL) PO Take by mouth daily at 6 (six) AM.         ALLERGIES: Allergies  Allergen Reactions   Atorvastatin     Other reaction(s): Muscle Pain   Empagliflozin     Other reaction(s): yeast   Penicillins Itching    hives   Sulfa Antibiotics Itching    rash     REVIEW OF SYSTEMS: A comprehensive ROS was conducted with the patient and is negative except as per HPI     OBJECTIVE:   VITAL SIGNS: BP (!) 148/60   Ht 5' 2 (1.575 m)   Wt 154 lb (69.9 kg)   BMI 28.17 kg/m    Filed Weights   04/24/24 1227  Weight: 154 lb (69.9 kg)       PHYSICAL EXAM:  General: Pt appears well and is in NAD  Neck: General: Supple without adenopathy or carotid bruits. Thyroid : Thyroid  size normal.  No goiter or nodules appreciated.   Lungs: Clear with good BS bilat with no rales, rhonchi, or wheezes  Heart: RRR   Extremities:  Lower extremities - No pretibial edema.   Neuro: MS is good with appropriate affect, pt is alert and Ox3    DM foot exam: 10/23/2023   The skin of the feet is intact without sores or ulcerations. The pedal pulses are 2+ on right and 2+ on left. The sensation is decreased  to a screening 5.07, 10 gram monofilament at the great toe   DATA REVIEWED:  Lab Results  Component Value Date   HGBA1C 7.8 (A) 04/24/2024   HGBA1C 6.5 (A) 02/27/2024   HGBA1C 6.6 (A) 10/23/2023       Latest Reference Range & Units 01/20/24 09:20  Sodium 135 - 145 mmol/L 141  Potassium 3.5 - 5.1 mmol/L 3.6  Chloride 98 - 111 mmol/L 109  CO2 22 - 32 mmol/L 22  Glucose 70 - 99 mg/dL 92  BUN 8 - 23 mg/dL 9   Creatinine 9.55 - 8.99 mg/dL 9.22  Calcium 8.9 - 89.6 mg/dL 9.3  Anion gap 5 - 15  10  Alkaline Phosphatase 38 - 126 U/L 71  Albumin  3.5 - 5.0 g/dL 4.1  Lipase 11 - 51 U/L 33  AST 15 - 41 U/L 24  ALT 0 - 44 U/L 14  Total Protein 6.5 - 8.1 g/dL 7.9  Total Bilirubin 0.0 - 1.2 mg/dL 0.8  GFR, Estimated >39 mL/min >60    ASSESSMENT / PLAN / RECOMMENDATIONS:   1) Type 2 Diabetes Mellitus, sub-optimally controlled, With neuropathic  complications and microalbuminuria- Most recent A1c of 7.8 %. Goal A1c < 7.0 %.     -A1c has trended up -Historically she has endorsed chronic diarrhea that she attributed to metformin , after reducing metformin  by 50%, she has noted dramatic improvement to the diarrhea - Has intolerance to Jardiance due to recurrent yeast infections - She did develop left upper quadrant pain that initially she thought was related to Ozempic , but she was diagnosed with left upper quadrant neuralgia, and the pain has improved dramatically with pain management - Patient would like  to on the Ozempic , caution against GI side effects and to monitor abdominal pain - I have also suggested switching insulin  mix to basal/prandial insulin  to have better predictability and better control of her BGs as they continue to fluctuate dramatically with glycemic excursions  MEDICATIONS: Continue metformin  500 mg XR 1  tab BID  Start Ozempic  0.25 mg weekly for 6 weeks, then increase to 0.5 mg weekly Stop Novolin  Mix  Start Lantus 24 units once daily Start NovoLog 6 units with each meal Start CF: NovoLog (BG -130/35) TIDQAC  EDUCATION / INSTRUCTIONS: BG monitoring instructions: Patient is instructed to check her blood sugars 2 times a day. Call [facility] if: BG persistently < 70  I reviewed the Rule of 15 for the treatment of hypoglycemia in detail with the patient. Literature supplied.   2) Diabetic complications:  Eye: Does not have known diabetic retinopathy.  Neuro/  Feet: Does  have known diabetic peripheral neuropathy. Renal: Patient does not have known baseline CKD. She is not on an ACEI/ARB at present.   3) Secondary Adrenal Insufficiency:   -She has been on chronic glucocorticoid therapy for asthma-COPD overlap -She has been on prednisone  5 mg since September 2024 - I switched from prednisone  to hydrocortisone  in 05/2023 -ACTH  has been normal - Fasting, 8 AM cortisol while holding hydrocortisone  dose the prior afternoon was low at 5.5 mcg/DL, we opted to continue hydrocortisone  - The patient did receive Kenalog injection through neurosurgery   Medication hydrocortisone  10 mg, 1.5 tablets with breakfast and half a tablet in the afternoon between 2-4 PM   Follow-up in 3 weeks   Signed electronically by: Stefano Redgie Butts, MD  [facility]  [facility] [address] [place] [place], [place] [address] Phone: [phone] FAX: [phone]   CC: Dow Rocky RIGGERS [address] [place] [address] Phone: [phone]  Fax: [phone]    Return to Endocrinology clinic as below: Future Appointments  Date Time Provider Department Center  05/06/2024  8:30 AM Iva Marty Saltness, MD [facility] None  05/20/2024 11:30 AM Analysia Dungee, Donell Redgie, MD [facility] None  08/26/2024 11:10 AM Lariza Cothron, Donell Redgie, MD [facility] None

## 2024-04-24 ENCOUNTER — Ambulatory Visit (INDEPENDENT_AMBULATORY_CARE_PROVIDER_SITE_OTHER): Admitting: Internal Medicine

## 2024-04-24 ENCOUNTER — Encounter: Payer: Self-pay | Admitting: Internal Medicine

## 2024-04-24 VITALS — BP 148/60 | Ht 62.0 in | Wt 154.0 lb

## 2024-04-24 DIAGNOSIS — F32A Depression, unspecified: Secondary | ICD-10-CM | POA: Insufficient documentation

## 2024-04-24 DIAGNOSIS — R809 Proteinuria, unspecified: Secondary | ICD-10-CM | POA: Insufficient documentation

## 2024-04-24 DIAGNOSIS — E7800 Pure hypercholesterolemia, unspecified: Secondary | ICD-10-CM | POA: Insufficient documentation

## 2024-04-24 DIAGNOSIS — E538 Deficiency of other specified B group vitamins: Secondary | ICD-10-CM | POA: Insufficient documentation

## 2024-04-24 DIAGNOSIS — E2749 Other adrenocortical insufficiency: Secondary | ICD-10-CM | POA: Diagnosis not present

## 2024-04-24 DIAGNOSIS — Z794 Long term (current) use of insulin: Secondary | ICD-10-CM

## 2024-04-24 DIAGNOSIS — E1165 Type 2 diabetes mellitus with hyperglycemia: Secondary | ICD-10-CM | POA: Diagnosis not present

## 2024-04-24 DIAGNOSIS — E1142 Type 2 diabetes mellitus with diabetic polyneuropathy: Secondary | ICD-10-CM | POA: Diagnosis not present

## 2024-04-24 DIAGNOSIS — E2839 Other primary ovarian failure: Secondary | ICD-10-CM | POA: Insufficient documentation

## 2024-04-24 DIAGNOSIS — G629 Polyneuropathy, unspecified: Secondary | ICD-10-CM | POA: Insufficient documentation

## 2024-04-24 DIAGNOSIS — Z7952 Long term (current) use of systemic steroids: Secondary | ICD-10-CM | POA: Insufficient documentation

## 2024-04-24 DIAGNOSIS — R0789 Other chest pain: Secondary | ICD-10-CM | POA: Insufficient documentation

## 2024-04-24 LAB — POCT GLYCOSYLATED HEMOGLOBIN (HGB A1C): Hemoglobin A1C: 7.8 % — AB (ref 4.0–5.6)

## 2024-04-24 MED ORDER — NOVOLOG FLEXPEN 100 UNIT/ML ~~LOC~~ SOPN: 6.0000 [IU] | PEN_INJECTOR | Freq: Three times a day (TID) | SUBCUTANEOUS | 3 refills | Status: AC

## 2024-04-24 MED ORDER — OZEMPIC (0.25 OR 0.5 MG/DOSE) 2 MG/3ML ~~LOC~~ SOPN: 0.5000 mg | PEN_INJECTOR | SUBCUTANEOUS | 3 refills | Status: AC

## 2024-04-24 MED ORDER — INSULIN GLARGINE 100 UNIT/ML SOLOSTAR PEN: 30.0000 [IU] | PEN_INJECTOR | Freq: Every day | SUBCUTANEOUS | 3 refills | Status: AC

## 2024-04-24 MED ORDER — INSULIN PEN NEEDLE 32G X 4 MM MISC
1.0000 | Freq: Four times a day (QID) | 3 refills | Status: AC
Start: 2024-04-24 — End: ?

## 2024-04-24 NOTE — Patient Instructions (Addendum)
 Start Ozempic  0.25 mg weekly for 6 weeks, then increase to 0.5 mg weekly Continue Metformin  500 mg XR 1 tablet before Breakfast and 1 tablet before Supper  STOP  Novolin  Mix START lantus 24 units ONCE daily  Start NovoLog 6 units with each meal Novolog correctional insulin : ADD extra units on insulin  to your meal-time Novolog dose if your blood sugars are higher than 165. Use the scale below to help guide you:   Blood sugar before meal Number of units to inject  Less than 165 0 unit  166 -  200 1 units  201 -  235 2 units  236 -  270 3 units  271 -  305 4 units  306 -  340 5 units  341 -  375 6 units  376 -  410 7 units        HOW TO TREAT LOW BLOOD SUGARS (Blood sugar LESS THAN 70 MG/DL) Please follow the RULE OF 15 for the treatment of hypoglycemia treatment (when your (blood sugars are less than 70 mg/dL)   STEP 1: Take 15 grams of carbohydrates when your blood sugar is low, which includes:  3-4 GLUCOSE TABS  OR 3-4 OZ OF JUICE OR REGULAR SODA OR ONE TUBE OF GLUCOSE GEL    STEP 2: RECHECK blood sugar in 15 MINUTES STEP 3: If your blood sugar is still low at the 15 minute recheck --> then, go back to STEP 1 and treat AGAIN with another 15 grams of carbohydrates.

## 2024-04-30 ENCOUNTER — Encounter (INDEPENDENT_AMBULATORY_CARE_PROVIDER_SITE_OTHER): Payer: Self-pay | Admitting: Gastroenterology

## 2024-05-04 DIAGNOSIS — H43812 Vitreous degeneration, left eye: Secondary | ICD-10-CM | POA: Diagnosis not present

## 2024-05-04 DIAGNOSIS — H0102B Squamous blepharitis left eye, upper and lower eyelids: Secondary | ICD-10-CM | POA: Diagnosis not present

## 2024-05-04 DIAGNOSIS — L2089 Other atopic dermatitis: Secondary | ICD-10-CM | POA: Diagnosis not present

## 2024-05-04 DIAGNOSIS — H0102A Squamous blepharitis right eye, upper and lower eyelids: Secondary | ICD-10-CM | POA: Diagnosis not present

## 2024-05-04 DIAGNOSIS — H40013 Open angle with borderline findings, low risk, bilateral: Secondary | ICD-10-CM | POA: Diagnosis not present

## 2024-05-06 ENCOUNTER — Encounter: Payer: Self-pay | Admitting: Allergy & Immunology

## 2024-05-06 ENCOUNTER — Ambulatory Visit: Admitting: Allergy & Immunology

## 2024-05-06 VITALS — BP 112/74 | HR 75 | Temp 98.3°F | Wt 156.2 lb

## 2024-05-06 DIAGNOSIS — K219 Gastro-esophageal reflux disease without esophagitis: Secondary | ICD-10-CM | POA: Diagnosis not present

## 2024-05-06 DIAGNOSIS — J310 Chronic rhinitis: Secondary | ICD-10-CM | POA: Diagnosis not present

## 2024-05-06 DIAGNOSIS — J4550 Severe persistent asthma, uncomplicated: Secondary | ICD-10-CM

## 2024-05-06 DIAGNOSIS — J339 Nasal polyp, unspecified: Secondary | ICD-10-CM | POA: Diagnosis not present

## 2024-05-06 NOTE — Patient Instructions (Addendum)
 1. Asthma-COPD overlap syndrome - Lung testing looked excellent. - You are doing AWESOME!  - Daily controller medication(s): Breztri  two puffs once daily  + Dupixent  300 mg every two weeks - Prior to physical activity: albuterol  2 puffs 10-15 minutes before physical activity. - Rescue medications: albuterol  4 puffs every 4-6 hours as needed - Asthma control goals:  * Full participation in all desired activities (may need albuterol  before activity) * Albuterol  use two time or less a week on average (not counting use with activity) * Cough interfering with sleep two time or less a month * Oral steroids no more than once a year * No hospitalizations  2. Chronic non-allergic rhinitis - Continue with the loratadine 10mg  daily. - Continue with a nasal steroid on Atrovent  (ipratropium) one spray per nostril daily (can use up to 3 times daily if needed, but this can be overdrying).  3. Return in about 4 months (around 09/03/2024). You can have the follow up appointment with Dr. Iva or a Nurse Practicioner (our Nurse Practitioners are excellent and always have Physician oversight!).    Please inform us  of any Emergency Department visits, hospitalizations, or changes in symptoms. Call us  before going to the ED for breathing or allergy symptoms since we might be able to fit you in for a sick visit. Feel free to contact us  anytime with any questions, problems, or concerns.  It was a pleasure to see you again today!  Websites that have reliable patient information: 1. American Academy of Asthma, Allergy, and Immunology: www.aaaai.org 2. Food Allergy Research and Education (FARE): foodallergy.org 3. Mothers of Asthmatics: http://www.asthmacommunitynetwork.org 4. American College of Allergy, Asthma, and Immunology: www.acaai.org      "Like" us  on Facebook and Instagram for our latest updates!      A healthy democracy works best when [employer] participate! Make sure you are registered to  vote! If you have moved or changed any of your contact information, you will need to get this updated before voting! Scan the QR codes below to learn more!

## 2024-05-06 NOTE — Progress Notes (Signed)
 FOLLOW UP  Date of Service/Encounter:  05/06/24   Assessment:   Asthma-COPD overlap syndrome - with eosinophilic phenotype (doing well on Dupixent )    Chronic non-allergic rhinitis   Nasal polyposis - stable   Chronic prednisone  use - now on Cortef  7.5 mg in AM and 2.5 mg in PM   Complicated past medical history including type 2 diabetes as well as GERD  Intercostal neuralgia - treated with intermittent injections   Plan/Recommendations:   1. Asthma-COPD overlap syndrome - Lung testing looked excellent. - You are doing AWESOME!  - Daily controller medication(s): Breztri  two puffs once daily  + Dupixent  300 mg every two weeks - Prior to physical activity: albuterol  2 puffs 10-15 minutes before physical activity. - Rescue medications: albuterol  4 puffs every 4-6 hours as needed - Asthma control goals:  * Full participation in all desired activities (may need albuterol  before activity) * Albuterol  use two time or less a week on average (not counting use with activity) * Cough interfering with sleep two time or less a month * Oral steroids no more than once a year * No hospitalizations  2. Chronic non-allergic rhinitis - Continue with the loratadine 10mg  daily. - Continue with a nasal steroid on Atrovent  (ipratropium) one spray per nostril daily (can use up to 3 times daily if needed, but this can be overdrying).  3. Return in about 4 months (around 09/03/2024). You can have the follow up appointment with Dr. Iva or a Nurse Practicioner (our Nurse Practitioners are excellent and always have Physician oversight!).    Subjective:   Emily Humphrey is a 73 y.o. female presenting today for follow up of  Chief Complaint  Patient presents with   Follow-up    Pt states overall symptoms are better.     Emily Humphrey has a history of the following: Patient Active Problem List   Diagnosis Date Noted   Depression 04/24/2024   Long term current use of systemic steroids  04/24/2024   Long-term insulin  use (HCC) 04/24/2024   Neuropathy 04/24/2024   Primary ovarian failure 04/24/2024   Proteinuria 04/24/2024   Pure hypercholesterolemia 04/24/2024   Rib pain 04/24/2024   Vitamin B12 deficiency (non anemic) 04/24/2024   Diverticulitis of colon 12/10/2023   Gastritis and gastroduodenitis 08/13/2023   Adenomatous polyp of ascending colon 08/13/2023   LUQ pain 07/02/2023   Nasal polyposis 01/09/2023   Chronic rhinitis 01/09/2023   Irritable bowel syndrome with constipation 07/31/2022   Type 2 diabetes mellitus with diabetic polyneuropathy, with long-term current use of insulin  (HCC) 07/16/2022   Type 2 diabetes mellitus with hyperglycemia, with long-term current use of insulin  (HCC) 07/16/2022   DOE (dyspnea on exertion) 02/17/2021   Cerebral vascular disease 09/06/2020   Mild cognitive impairment 09/06/2020   Abnormal finding on MRI of brain 05/10/2020   Gait abnormality 05/10/2020   Phyllodes tumor, benign, left 09/24/2018   Essential hypertension, benign 08/01/2015   Type 2 diabetes mellitus, uncontrolled (HCC)    Hyperlipidemia    Asthma-COPD overlap syndrome (HCC)    History of tobacco abuse    Asthma    GERD (gastroesophageal reflux disease)    Morbid obesity (HCC)    Chest pain    Pericardial effusion    Ejection fraction    Pericarditis     History obtained from: chart review and patient.  Discussed the use of AI scribe software for clinical note transcription with the patient and/or guardian, who gave verbal consent to proceed.  Emily Humphrey  is a 73 y.o. female presenting for a follow up visit. She was last seen in March 2025.  At that time, lung testing looked excellent.  We continue with Breztri  2 puffs once daily as well as Dupixent  every 2 weeks.  For nonallergic rhinitis, she remains on the loratadine and Atrovent .   She did see Arlean Mutter, on our Nurse Practitioners, in April 2025 with an asthma exacerbation. She was started on Flovent  two  puffs BID to help get her through that flare.   Since last visit, she has done very well.   Asthma/Respiratory Symptom History: She experienced a recent exacerbation of her asthma, necessitating the use of her nebulizer, which she had not used in a long time. She is currently on Dupixent  and had previously resumed Breztri  after a flare-up earlier this year, but is not currently taking Breztri . She had previously stopped Breztri  due to cost concerns but was able to obtain it at no cost with assistance. She reports she is not currently using antibiotics or nasal spray.  She recently started using a CPAP machine to address snoring, using a nasal pillow mask. She is adjusting to the device, having used it for two nights so far.  She was diagnosed with intercostal neuralgia, causing significant pain affecting her ribs and originating from the spine. The diagnosis took several months, from April to August, involving multiple CT scans before an MRI was performed. She received a spine block from a specialist, which provided some relief. The pain is described as severe and long-lasting, requiring frequent use of pain medication. She has not had an injection since September but reports being able to live normally now.  She mentions a past experience with her previous pulmonologist, who misdiagnosed her neuralgia as gas, which was not alleviated by the prescribed medication. She also discusses her use of Ozempic , which was initially stopped due to concerns about pain but was resumed after further evaluation showed no issues.  Allergic Rhinitis Symptom History: She remains on loratadine daily as well as Atrovent .  This combination seems to be working well.  Allergy testing in the past has been negative.  She has not been on antibiotics for sinus infections.  Socially, she plans to host a gathering of 15 to 20 friends and family members for the holidays, including people from her church.   Otherwise, there have  been no changes to her past medical history, surgical history, family history, or social history.    Review of systems otherwise negative other than that mentioned in the HPI.    Objective:   Blood pressure 112/74, pulse 75, temperature 98.3 F (36.8 C), temperature source Temporal, weight 156 lb 3.2 oz (70.9 kg), SpO2 99%. Body mass index is 28.57 kg/m.    Physical Exam Vitals reviewed.  Constitutional:      Appearance: She is well-developed.     Comments: Delightful. Talkative.   HENT:     Head: Normocephalic and atraumatic.     Right Ear: Tympanic membrane, ear canal and external ear normal. No drainage, swelling or tenderness. Tympanic membrane is not injected, scarred, erythematous, retracted or bulging.     Left Ear: Tympanic membrane, ear canal and external ear normal. No drainage, swelling or tenderness. Tympanic membrane is not injected, scarred, erythematous, retracted or bulging.     Nose: No nasal deformity, septal deviation, mucosal edema or rhinorrhea.     Right Turbinates: Enlarged, swollen and pale.     Left Turbinates: Enlarged, swollen and pale.  Right Sinus: No maxillary sinus tenderness or frontal sinus tenderness.     Left Sinus: No maxillary sinus tenderness or frontal sinus tenderness.     Comments: Improvement in bilateral nasal polyposis with approximately 25% obstruction bilaterally.     Mouth/Throat:     Lips: Pink.     Mouth: Mucous membranes are moist. Mucous membranes are not pale and not dry.     Pharynx: Uvula midline.     Comments: Cobblestoning in the posterior oropharynx. Eyes:     General: Lids are normal. Allergic shiner present.        Right eye: No discharge.        Left eye: No discharge.     Conjunctiva/sclera: Conjunctivae normal.     Right eye: Right conjunctiva is not injected. No chemosis.    Left eye: Left conjunctiva is not injected. No chemosis.    Pupils: Pupils are equal, round, and reactive to light.  Cardiovascular:      Rate and Rhythm: Normal rate and regular rhythm.     Heart sounds: Normal heart sounds.  Pulmonary:     Effort: Pulmonary effort is normal. No tachypnea, accessory muscle usage or respiratory distress.     Breath sounds: Normal breath sounds. No wheezing, rhonchi or rales.     Comments: Moving air well in all lung fields.  No increased work of breathing. Chest:     Chest wall: No tenderness.  Abdominal:     Tenderness: There is no abdominal tenderness. There is no guarding or rebound.  Musculoskeletal:     Cervical back: Normal range of motion.  Lymphadenopathy:     Head:     Right side of head: No submandibular, tonsillar or occipital adenopathy.     Left side of head: No submandibular, tonsillar or occipital adenopathy.     Cervical: No cervical adenopathy.  Skin:    General: Skin is warm.     Capillary Refill: Capillary refill takes less than 2 seconds.     Coloration: Skin is not pale.     Findings: No abrasion, erythema, petechiae or rash. Rash is not papular, urticarial or vesicular.     Comments: No eczematous or urticarial lesions noted.  Neurological:     Mental Status: She is alert.  Psychiatric:        Behavior: Behavior is cooperative.      Diagnostic studies:    Spirometry: results normal (FEV1: 1.75/87%, FVC: 2.24/86%, FEV1/FVC: 78%).    Spirometry consistent with normal pattern.   Allergy Studies: none       Marty Shaggy, MD  [facility] 

## 2024-05-12 DIAGNOSIS — I10 Essential (primary) hypertension: Secondary | ICD-10-CM | POA: Diagnosis not present

## 2024-05-12 DIAGNOSIS — E1165 Type 2 diabetes mellitus with hyperglycemia: Secondary | ICD-10-CM | POA: Diagnosis not present

## 2024-05-12 DIAGNOSIS — Z1329 Encounter for screening for other suspected endocrine disorder: Secondary | ICD-10-CM | POA: Diagnosis not present

## 2024-05-12 DIAGNOSIS — E7849 Other hyperlipidemia: Secondary | ICD-10-CM | POA: Diagnosis not present

## 2024-05-12 DIAGNOSIS — D72829 Elevated white blood cell count, unspecified: Secondary | ICD-10-CM | POA: Diagnosis not present

## 2024-05-20 ENCOUNTER — Ambulatory Visit: Admitting: Internal Medicine

## 2024-05-26 ENCOUNTER — Ambulatory Visit: Admitting: Internal Medicine

## 2024-06-01 ENCOUNTER — Other Ambulatory Visit (INDEPENDENT_AMBULATORY_CARE_PROVIDER_SITE_OTHER): Payer: Self-pay | Admitting: Gastroenterology

## 2024-06-24 ENCOUNTER — Other Ambulatory Visit (INDEPENDENT_AMBULATORY_CARE_PROVIDER_SITE_OTHER): Payer: Self-pay | Admitting: Gastroenterology

## 2024-06-24 DIAGNOSIS — K581 Irritable bowel syndrome with constipation: Secondary | ICD-10-CM

## 2024-06-25 ENCOUNTER — Ambulatory Visit (INDEPENDENT_AMBULATORY_CARE_PROVIDER_SITE_OTHER): Admitting: Internal Medicine

## 2024-06-25 ENCOUNTER — Encounter: Payer: Self-pay | Admitting: Internal Medicine

## 2024-06-25 VITALS — BP 118/78 | Ht 62.0 in | Wt 155.0 lb

## 2024-06-25 DIAGNOSIS — Z7984 Long term (current) use of oral hypoglycemic drugs: Secondary | ICD-10-CM

## 2024-06-25 DIAGNOSIS — E2749 Other adrenocortical insufficiency: Secondary | ICD-10-CM | POA: Diagnosis not present

## 2024-06-25 DIAGNOSIS — Z794 Long term (current) use of insulin: Secondary | ICD-10-CM | POA: Diagnosis not present

## 2024-06-25 DIAGNOSIS — Z7985 Long-term (current) use of injectable non-insulin antidiabetic drugs: Secondary | ICD-10-CM

## 2024-06-25 DIAGNOSIS — E1142 Type 2 diabetes mellitus with diabetic polyneuropathy: Secondary | ICD-10-CM

## 2024-06-25 LAB — POCT GLYCOSYLATED HEMOGLOBIN (HGB A1C): Hemoglobin A1C: 6.4 % — AB (ref 4.0–5.6)

## 2024-06-25 NOTE — Patient Instructions (Signed)
" °  Continue Metformin  500 mg XR 1 tablet before Breakfast and 1 tablet before Supper  Continue Ozempic  0.5 mg weekly Decrease lantus  20 units ONCE daily  Continue NovoLog  6 units with each meal Novolog  correctional insulin : ADD extra units on insulin  to your meal-time Novolog  dose if your blood sugars are higher than 165. Use the scale below to help guide you:   Blood sugar before meal Number of units to inject  Less than 165 0 unit  166 -  200 1 units  201 -  235 2 units  236 -  270 3 units  271 -  305 4 units  306 -  340 5 units  341 -  375 6 units  376 -  410 7 units        HOW TO TREAT LOW BLOOD SUGARS (Blood sugar LESS THAN 70 MG/DL) Please follow the RULE OF 15 for the treatment of hypoglycemia treatment (when your (blood sugars are less than 70 mg/dL)   STEP 1: Take 15 grams of carbohydrates when your blood sugar is low, which includes:  3-4 GLUCOSE TABS  OR 3-4 OZ OF JUICE OR REGULAR SODA OR ONE TUBE OF GLUCOSE GEL    STEP 2: RECHECK blood sugar in 15 MINUTES STEP 3: If your blood sugar is still low at the 15 minute recheck --> then, go back to STEP 1 and treat AGAIN with another 15 grams of carbohydrates. "

## 2024-06-25 NOTE — Progress Notes (Signed)
 " Name: [name]  MRN/ DOB: [id number], [birth year]   Age/ Sex: 74 y.o., female    PCP: Dow Longs, PA-C   Reason for Endocrinology Evaluation: Type 2 Diabetes Mellitus     Date of Initial Endocrinology Visit: 07/16/2022    PATIENT IDENTIFIER: Emily Humphrey is a 74 y.o. female with a past medical history of DM, dyslipidemia, COPD, . The patient presented for initial endocrinology clinic visit on 07/16/2022  for consultative assistance with her diabetes management.    HPI:   Diagnosed with DM > 20 yrs   Prior Medications tried/Intolerance: Jardiance- yeast infection Hemoglobin A1c has ranged from 7.5% in 2023, peaking at 11.0% in the past .   Saw  Dr. Tommas in the past     She is on chronic prednisone  for Asthma   Took one dose of Ozempic   but PCP advised to stop it as she had low BP and not feeling well at the time  On her initial visit to our clinic she had an A1c of 8.5%, she was on metformin  and Novolin  mix, I decreased her metformin  due to reported diarrhea and adjusted her insulin    Discontinued Ozempic  by July, 2025 due to left upper quadrant pain, it was later determined that the pain is not related to Ozempic , but we did opt to hold off on Ozempic  during her visit in September, 2025  Ozempic  was restarted in November, 2025 after she was diagnosed with left upper quadrant neuralgia   By November, 2025 I switched insulin  mix to basal/prandial insulin   SECONDARY ADRENAL INSUFFICIENCY: The patient was on chronic glucocorticoid therapy for asthma-COPD overlap syndrome..  Per Dr. Laney recommendations we started working on weaning the patient off glucocorticoid therapy.  I switched from prednisone  to hydrocortisone  05/2023  ACTH  has been normal at 49 PG/mL in  05/2023  SUBJECTIVE:   During the last visit (04/24/2024): A1c 7.8%  Today (06/25/2024): [name] is here for follow-up on diabetes management.  She  checks her blood sugars multiple  times daily. The  patient has had hypoglycemic episodes since the last clinic visit. The patient is symptomatic with these episodes.  She continues to follow-up with allergy and immunology for asthma The follows with [facility] for intercostal neuralgia, scheduled tomorrow for follow up , pain is fluctating again   Patient follows with GI for IBS- C  Weight remains stable Minimal nausea initially with Ozempic  but has resolved  No constipation or recent diarrhea     HOME DIABETES REGIMEN: Continue metformin  500 mg XR 1  tab BID  Ozempic  0.5 mg weekly  Lantus  24 units once daily  NovoLog  6 units with each meal CF: NovoLog  (BG -130/35) TIDQAC Hydrocortisone  10 mg, 1.5 tablets with breakfast and half a tablet in the afternoon between 2-4 PM     Statin: yes ACE-I/ARB: no    CONTINUOUS GLUCOSE MONITORING RECORD INTERPRETATION    Dates of Recording: 12/26-06/25/2024  Sensor description: [employer]  Results statistics:   CGM use % of time 96  Average and SD 109/26.7  Time in range 98%  % Time Above 180 2  % Time above 250 0  % Time Below target 0   Glycemic patterns summary: BGs are optimal throughout the day and night Hyperglycemic episodes rare, post prandial  Hypoglycemic episodes occurred rarely at variable times Overnight periods: Optimal   DIABETIC COMPLICATIONS: Microvascular complications:  Neuropathy  Denies: CKD  Last eye exam: Completed 06/2021  Macrovascular complications:  Denies: CAD, PVD, CVA   PAST HISTORY: Past Medical History:  Past Medical History:  Diagnosis Date   Abnormal gait    Asthma    Chest pain    Hospital, May, 2012,  Pericarditis   COPD (chronic obstructive pulmonary disease) (HCC)    Chronic steroid use   Dyslipidemia    Ejection fraction    Normal, echo, [id number]   GERD (gastroesophageal reflux disease)    History of tobacco abuse    IDDM (insulin  dependent diabetes mellitus)    Morbid obesity (HCC)    OSA  (obstructive sleep apnea)    mild/not using C-PAP    Pericardial effusion    Small, echo, circumferential, May, 2012   Pericarditis    Hospitalization, May, 2012   Pneumonia 2010   Tremor    Past Surgical History:  Past Surgical History:  Procedure Laterality Date   ABDOMINAL HYSTERECTOMY     BIOPSY  08/13/2023   Procedure: BIOPSY;  Surgeon: Cinderella Deatrice FALCON, MD;  [facility];  Service: Endoscopy;;   COLONOSCOPY WITH PROPOFOL  N/A 08/13/2023   Procedure: COLONOSCOPY WITH PROPOFOL ;  Surgeon: Cinderella Deatrice FALCON, MD;  [facility]: [facility];  Service: Endoscopy;  Laterality: N/A;  9:00AM;ASA 2   ESOPHAGOGASTRODUODENOSCOPY (EGD) WITH PROPOFOL  N/A 08/13/2023   Procedure: ESOPHAGOGASTRODUODENOSCOPY (EGD) WITH PROPOFOL ;  Surgeon: Cinderella Deatrice FALCON, MD;  [facility]: [facility];  Service: Endoscopy;  Laterality: N/A;  9:00AM;ASA 2   NASAL SINUS SURGERY     POLYPECTOMY  08/13/2023   Procedure: POLYPECTOMY;  Surgeon: Cinderella Deatrice FALCON, MD;  Location: [facility];  Service: Endoscopy;;   Removal of throat nodules     vocal cored nodules    Social History:  reports that she quit smoking about 29 years ago. Her smoking use included cigarettes. She started smoking about 49 years ago. She has a 20 pack-year smoking history. She has been exposed to tobacco smoke. She has never used smokeless tobacco. She reports that she does not currently use alcohol . She reports that she does not use drugs. Family History:  Family History  Problem Relation Age of Onset   Heart attack Mother        deceased at age 30   Asthma Mother    Other Father        deceased at age 49   Diabetes Sister      HOME MEDICATIONS: Allergies as of 06/25/2024       Reactions   Atorvastatin    Other reaction(s): Muscle Pain   Empagliflozin    Other reaction(s): yeast   Penicillins Itching   hives   Sulfa Antibiotics Itching   rash        Medication List        Accurate as of June 25, 2024  9:40 AM.  If you have any questions, ask your nurse or doctor.          Accu-Chek Aviva Plus test strip Generic drug: glucose blood   albuterol  108 (90 Base) MCG/ACT inhaler Commonly known as: VENTOLIN  HFA Inhale 2 puffs into the lungs every 4 (four) hours as needed.   albuterol  (2.5 MG/3ML) 0.083% nebulizer solution Commonly known as: PROVENTIL  Take 3 mLs (2.5 mg total) by nebulization every 4 (four) hours as needed for wheezing or shortness of breath.   ALPRAZolam 0.25 MG tablet Commonly known as: XANAX Take 0.25 mg by mouth daily as needed.   Azelastine  HCl 137 MCG/SPRAY Soln INSTILL 2 SPRAYS IN EACH NOSTRIL  TWICE DAILY AS DIRECTED   Azelastine -Fluticasone  137-50 MCG/ACT Susp 2 sprays in each nostril up to twice a day if needed for nasal symtpoms.   Breztri  Aerosphere 160-9-4.8 MCG/ACT Aero inhaler Generic drug: budesonide-glycopyrrolate -formoterol Inhale 2 puffs into the lungs in the morning and at bedtime.   donepezil  10 MG tablet Commonly known as: ARICEPT  Take 1 tablet (10 mg total) by mouth at bedtime.   Dupixent  300 MG/2ML prefilled syringe Generic drug: dupilumab  Inject 300 mg into the skin every 14 (fourteen) days.   famotidine  20 MG tablet Commonly known as: PEPCID  Take 1 tablet (20 mg total) by mouth daily.   fluticasone  50 MCG/ACT nasal spray Commonly known as: FLONASE  INSTILL 2 SPRAYS IN EACH NOSTRIL TWICE A DAY   FreeStyle Libre 14 Day Reader Espiridion by Does not apply route.   FreeStyle Libre 3 Sensor Misc Change sensor every 14 days   FreeStyle Libre 3 Plus Sensor Misc Change sensor every 15 days.   gabapentin 600 MG tablet Commonly known as: NEURONTIN Take 600 mg by mouth as needed.   HYDROcodone-acetaminophen 5-325 MG tablet Commonly known as: NORCO/VICODIN Take 1 tablet by mouth every 6 (six) hours as needed.   hydrocortisone  10 MG tablet Commonly known as: CORTEF  Take 1 tablet (10 mg total) by mouth as directed. 1.5 tab QAM and half a tab  between 2-4 pm   insulin  glargine 100 UNIT/ML Solostar Pen Commonly known as: LANTUS  Inject 30 Units into the skin daily.   Insulin  Pen Needle 32G X 4 MM Misc 1 Device by Does not apply route in the morning, at noon, in the evening, and at bedtime.   ipratropium 0.03 % nasal spray Commonly known as: ATROVENT  Place one spray per nostril daily (can use up to 3 times daily if needed, but this can be over drying)   lidocaine  5 % Commonly known as: Lidoderm  Place 1 patch onto the skin daily. Remove & Discard patch within 12 hours or as directed by MD   lubiprostone  24 MCG capsule Commonly known as: Amitiza  Take 1 capsule (24 mcg total) by mouth 2 (two) times daily with a meal.   metFORMIN  500 MG 24 hr tablet Commonly known as: GLUCOPHAGE -XR Take 1 tablet (500 mg total) by mouth 2 (two) times daily.   NovoLOG  FlexPen 100 UNIT/ML FlexPen Generic drug: insulin  aspart Inject 6 Units into the skin 3 (three) times daily with meals.   omeprazole  40 MG capsule Commonly known as: PRILOSEC TAKE 1 CAPSULE (40 MG TOTAL) BY MOUTH DAILY.   Ozempic  (0.25 or 0.5 MG/DOSE) 2 MG/3ML Sopn Generic drug: Semaglutide (0.25 or 0.5MG /DOS) Inject 0.5 mg into the skin once a week.   Potassium 99 MG Tabs Take 1 tablet by mouth daily.   PROBIOTIC DAILY PO Take by mouth. One daily   simvastatin 20 MG tablet Commonly known as: ZOCOR Take 20 mg by mouth daily.   traZODone 100 MG tablet Commonly known as: DESYREL Take 100 mg by mouth.   venlafaxine XR 75 MG 24 hr capsule Commonly known as: EFFEXOR-XR Take 75 mg by mouth daily.   VITAMIN D (CHOLECALCIFEROL) PO Take by mouth daily at 6 (six) AM.         ALLERGIES: Allergies  Allergen Reactions   Atorvastatin     Other reaction(s): Muscle Pain   Empagliflozin     Other reaction(s): yeast   Penicillins Itching    hives   Sulfa Antibiotics Itching    rash     REVIEW OF SYSTEMS:  A comprehensive ROS was conducted with the patient and  is negative except as per HPI     OBJECTIVE:   VITAL SIGNS: BP 118/78   Ht 5' 2 (1.575 m)   Wt 155 lb (70.3 kg)   BMI 28.35 kg/m    Filed Weights   06/25/24 0938  Weight: 155 lb (70.3 kg)        PHYSICAL EXAM:  General: Pt appears well and is in NAD  Lungs: Clear with good BS bilat with no rales, rhonchi, or wheezes  Heart: RRR   Extremities:  Lower extremities - No pretibial edema.   Neuro: MS is good with appropriate affect, pt is alert and Ox3    DM foot exam: 10/23/2023   The skin of the feet is intact without sores or ulcerations. The pedal pulses are 2+ on right and 2+ on left. The sensation is decreased  to a screening 5.07, 10 gram monofilament at the great toe   DATA REVIEWED:  Lab Results  Component Value Date   HGBA1C 7.8 (A) 04/24/2024   HGBA1C 6.5 (A) 02/27/2024   HGBA1C 6.6 (A) 10/23/2023       Latest Reference Range & Units 01/20/24 09:20  Sodium 135 - 145 mmol/L 141  Potassium 3.5 - 5.1 mmol/L 3.6  Chloride 98 - 111 mmol/L 109  CO2 22 - 32 mmol/L 22  Glucose 70 - 99 mg/dL 92  BUN 8 - 23 mg/dL 9  Creatinine 9.55 - 8.99 mg/dL 9.22  Calcium 8.9 - 89.6 mg/dL 9.3  Anion gap 5 - 15  10  Alkaline Phosphatase 38 - 126 U/L 71  Albumin  3.5 - 5.0 g/dL 4.1  Lipase 11 - 51 U/L 33  AST 15 - 41 U/L 24  ALT 0 - 44 U/L 14  Total Protein 6.5 - 8.1 g/dL 7.9  Total Bilirubin 0.0 - 1.2 mg/dL 0.8  GFR, Estimated >39 mL/min >60    ASSESSMENT / PLAN / RECOMMENDATIONS:   1) Type 2 Diabetes Mellitus, optimally controlled, With neuropathic  complications and microalbuminuria- Most recent A1c of 6.4%. Goal A1c < 7.0 %.    -A1c has trended down from 7.8 to 6.4% -Historically she has endorsed chronic diarrhea that she attributed to metformin , after reducing metformin  by 50%, she has noted dramatic improvement to the diarrhea - Has intolerance to Jardiance due to recurrent yeast infections - She did develop left upper quadrant pain that initially she thought  was related to Ozempic , but she was diagnosed with left upper quadrant neuralgia, and the pain has improved dramatically with pain management -I had switched insulin  mix to basal/prandial insulin  in November, 2025 with an A1c of 7.8% -She is doing very well with current basal/prandial insulin , I did recommend decreasing basal insulin  due to overnight hypoglycemia -We opted to remain on current dose of Ozempic    MEDICATIONS: Continue metformin  500 mg XR 1  tab BID  Continue Ozempic  0.5 mg weekly Decrease Lantus  20 units once daily Continue NovoLog  6 units with each meal Continue CF: NovoLog  (BG -130/35) TIDQAC  EDUCATION / INSTRUCTIONS: BG monitoring instructions: Patient is instructed to check her blood sugars 2 times a day. Call  Endocrinology clinic if: BG persistently < 70  I reviewed the Rule of 15 for the treatment of hypoglycemia in detail with the patient. Literature supplied.   2) Diabetic complications:  Eye: Does not have known diabetic retinopathy.  Neuro/ Feet: Does  have known diabetic peripheral neuropathy. Renal: Patient does not have known  baseline CKD. She is not on an ACEI/ARB at present.   3) Secondary Adrenal Insufficiency:   -She has been on chronic glucocorticoid therapy for asthma-COPD overlap -She has been on prednisone  5 mg since September 2024 - I switched from prednisone  to hydrocortisone  in 05/2023 -ACTH  has been normal - Fasting, 8 AM cortisol while holding hydrocortisone  dose the prior afternoon was low at 5.5 mcg/DL, we opted to continue hydrocortisone   Medication hydrocortisone  10 mg, 1.5 tablets with breakfast and half a tablet in the afternoon between 2-4 PM  Follow-up in 4 months  Signed electronically by: Stefano Redgie Butts, MD  [facility]  [facility] [address] [place] [place], [place] [address] Phone: [phone] FAX: [phone]   CC: Dow Rocky RIGGERS [address] [place]  [address] Phone: [phone]  Fax: [phone]    Return to Endocrinology clinic as below: Future Appointments  Date Time Provider Department Center  08/26/2024 11:10 AM Rufus Beske, Donell Redgie, MD [facility] None  09/04/2024  8:30 AM Ambs, Arlean HERO, FNP [facility] None    "

## 2024-06-29 ENCOUNTER — Telehealth: Payer: Self-pay

## 2024-06-29 NOTE — Telephone Encounter (Signed)
 Patient had a recent steroid injection.  Her glucose has been around 165-200 since.  She would like to know if she needs to adjust her medication.  Libre uploaded to [employer].  Lantus  20units am, 6-8 units novolog  before meals

## 2024-06-29 NOTE — Telephone Encounter (Signed)
 Advised patient to follow sliding scale for glucose levels.

## 2024-07-15 ENCOUNTER — Telehealth: Payer: Self-pay | Admitting: Family Medicine

## 2024-07-15 NOTE — Telephone Encounter (Signed)
 [facility] called to get updated Rx for [place], can resent through to them and they'll set up delivery

## 2024-07-22 ENCOUNTER — Other Ambulatory Visit: Payer: Self-pay | Admitting: *Deleted

## 2024-07-22 MED ORDER — DUPIXENT 300 MG/2ML ~~LOC~~ SOSY: 300.0000 mg | PREFILLED_SYRINGE | SUBCUTANEOUS | 11 refills | Status: AC

## 2024-08-26 ENCOUNTER — Ambulatory Visit: Admitting: Internal Medicine

## 2024-09-04 ENCOUNTER — Ambulatory Visit: Admitting: Family Medicine

## 2024-10-22 ENCOUNTER — Ambulatory Visit: Admitting: Internal Medicine
